# Patient Record
Sex: Female | Born: 1940 | Race: Black or African American | Hispanic: No | Smoking: Former smoker
Health system: Southern US, Community
[De-identification: ages and names within clinical notes are randomized; demographics above are authoritative.]

## PROBLEM LIST (undated history)

## (undated) DIAGNOSIS — Z5189 Encounter for other specified aftercare: Secondary | ICD-10-CM

## (undated) DIAGNOSIS — I872 Venous insufficiency (chronic) (peripheral): Secondary | ICD-10-CM

## (undated) DIAGNOSIS — M8580 Other specified disorders of bone density and structure, unspecified site: Secondary | ICD-10-CM

## (undated) DIAGNOSIS — Z6841 Body Mass Index (BMI) 40.0 and over, adult: Secondary | ICD-10-CM

## (undated) DIAGNOSIS — T300 Burn of unspecified body region, unspecified degree: Secondary | ICD-10-CM

## (undated) DIAGNOSIS — I10 Essential (primary) hypertension: Secondary | ICD-10-CM

## (undated) DIAGNOSIS — D126 Benign neoplasm of colon, unspecified: Secondary | ICD-10-CM

## (undated) DIAGNOSIS — K219 Gastro-esophageal reflux disease without esophagitis: Secondary | ICD-10-CM

## (undated) DIAGNOSIS — M1990 Unspecified osteoarthritis, unspecified site: Secondary | ICD-10-CM

## (undated) DIAGNOSIS — K649 Unspecified hemorrhoids: Secondary | ICD-10-CM

## (undated) DIAGNOSIS — H269 Unspecified cataract: Secondary | ICD-10-CM

## (undated) DIAGNOSIS — E785 Hyperlipidemia, unspecified: Secondary | ICD-10-CM

## (undated) HISTORY — DX: Unspecified osteoarthritis, unspecified site: M19.90

## (undated) HISTORY — DX: Burn of unspecified body region, unspecified degree: T30.0

## (undated) HISTORY — DX: Essential (primary) hypertension: I10

## (undated) HISTORY — DX: Encounter for other specified aftercare: Z51.89

## (undated) HISTORY — DX: Morbid (severe) obesity due to excess calories: E66.01

## (undated) HISTORY — DX: Gastro-esophageal reflux disease without esophagitis: K21.9

## (undated) HISTORY — DX: Benign neoplasm of colon, unspecified: D12.6

## (undated) HISTORY — DX: Unspecified cataract: H26.9

## (undated) HISTORY — DX: Unspecified hemorrhoids: K64.9

## (undated) HISTORY — DX: Venous insufficiency (chronic) (peripheral): I87.2

## (undated) HISTORY — DX: Body Mass Index (BMI) 40.0 and over, adult: Z684

## (undated) HISTORY — DX: Other specified disorders of bone density and structure, unspecified site: M85.80

## (undated) HISTORY — DX: Hyperlipidemia, unspecified: E78.5

---

## 1960-07-10 DIAGNOSIS — T300 Burn of unspecified body region, unspecified degree: Secondary | ICD-10-CM

## 1960-07-10 HISTORY — DX: Burn of unspecified body region, unspecified degree: T30.0

## 1960-07-10 HISTORY — PX: OTHER SURGICAL HISTORY: SHX169

## 1997-12-11 ENCOUNTER — Encounter: Admission: RE | Admit: 1997-12-11 | Discharge: 1997-12-11 | Payer: Self-pay | Admitting: Internal Medicine

## 1999-01-31 ENCOUNTER — Encounter: Admission: RE | Admit: 1999-01-31 | Discharge: 1999-01-31 | Payer: Self-pay | Admitting: Internal Medicine

## 1999-02-07 ENCOUNTER — Encounter: Admission: RE | Admit: 1999-02-07 | Discharge: 1999-02-07 | Payer: Self-pay | Admitting: Internal Medicine

## 1999-02-28 ENCOUNTER — Encounter: Admission: RE | Admit: 1999-02-28 | Discharge: 1999-02-28 | Payer: Self-pay | Admitting: Internal Medicine

## 1999-04-18 ENCOUNTER — Encounter: Admission: RE | Admit: 1999-04-18 | Discharge: 1999-04-18 | Payer: Self-pay | Admitting: Hematology and Oncology

## 1999-05-11 ENCOUNTER — Ambulatory Visit (HOSPITAL_COMMUNITY): Admission: RE | Admit: 1999-05-11 | Discharge: 1999-05-11 | Payer: Self-pay | Admitting: *Deleted

## 1999-08-01 ENCOUNTER — Encounter: Admission: RE | Admit: 1999-08-01 | Discharge: 1999-08-01 | Payer: Self-pay | Admitting: Internal Medicine

## 2000-04-17 ENCOUNTER — Encounter: Admission: RE | Admit: 2000-04-17 | Discharge: 2000-04-17 | Payer: Self-pay | Admitting: Internal Medicine

## 2000-05-14 ENCOUNTER — Ambulatory Visit (HOSPITAL_COMMUNITY): Admission: RE | Admit: 2000-05-14 | Discharge: 2000-05-14 | Payer: Self-pay

## 2000-09-18 ENCOUNTER — Encounter: Admission: RE | Admit: 2000-09-18 | Discharge: 2000-09-18 | Payer: Self-pay | Admitting: Internal Medicine

## 2000-10-18 ENCOUNTER — Encounter: Admission: RE | Admit: 2000-10-18 | Discharge: 2000-10-18 | Payer: Self-pay | Admitting: Hematology and Oncology

## 2000-11-23 ENCOUNTER — Encounter: Admission: RE | Admit: 2000-11-23 | Discharge: 2000-11-23 | Payer: Self-pay | Admitting: Internal Medicine

## 2000-11-30 ENCOUNTER — Encounter: Admission: RE | Admit: 2000-11-30 | Discharge: 2000-11-30 | Payer: Self-pay | Admitting: Internal Medicine

## 2001-04-16 ENCOUNTER — Encounter: Admission: RE | Admit: 2001-04-16 | Discharge: 2001-04-16 | Payer: Self-pay | Admitting: Internal Medicine

## 2001-09-18 ENCOUNTER — Encounter: Admission: RE | Admit: 2001-09-18 | Discharge: 2001-09-18 | Payer: Self-pay | Admitting: Internal Medicine

## 2001-09-24 ENCOUNTER — Encounter: Payer: Self-pay | Admitting: Internal Medicine

## 2001-09-24 ENCOUNTER — Ambulatory Visit (HOSPITAL_COMMUNITY): Admission: RE | Admit: 2001-09-24 | Discharge: 2001-09-24 | Payer: Self-pay | Admitting: Internal Medicine

## 2001-11-25 ENCOUNTER — Encounter: Admission: RE | Admit: 2001-11-25 | Discharge: 2001-11-25 | Payer: Self-pay | Admitting: Internal Medicine

## 2002-03-25 ENCOUNTER — Encounter: Admission: RE | Admit: 2002-03-25 | Discharge: 2002-03-25 | Payer: Self-pay | Admitting: Internal Medicine

## 2002-04-07 ENCOUNTER — Ambulatory Visit (HOSPITAL_COMMUNITY): Admission: RE | Admit: 2002-04-07 | Discharge: 2002-04-07 | Payer: Self-pay | Admitting: Internal Medicine

## 2002-05-02 ENCOUNTER — Encounter: Admission: RE | Admit: 2002-05-02 | Discharge: 2002-05-02 | Payer: Self-pay | Admitting: Internal Medicine

## 2002-11-03 ENCOUNTER — Encounter: Admission: RE | Admit: 2002-11-03 | Discharge: 2002-11-03 | Payer: Self-pay | Admitting: Internal Medicine

## 2002-12-30 ENCOUNTER — Encounter: Admission: RE | Admit: 2002-12-30 | Discharge: 2002-12-30 | Payer: Self-pay | Admitting: Internal Medicine

## 2003-03-24 ENCOUNTER — Encounter: Admission: RE | Admit: 2003-03-24 | Discharge: 2003-03-24 | Payer: Self-pay | Admitting: Internal Medicine

## 2003-04-20 ENCOUNTER — Encounter: Admission: RE | Admit: 2003-04-20 | Discharge: 2003-04-20 | Payer: Self-pay | Admitting: Internal Medicine

## 2003-06-02 ENCOUNTER — Encounter: Admission: RE | Admit: 2003-06-02 | Discharge: 2003-06-02 | Payer: Self-pay | Admitting: Internal Medicine

## 2003-06-16 ENCOUNTER — Encounter: Admission: RE | Admit: 2003-06-16 | Discharge: 2003-06-16 | Payer: Self-pay | Admitting: Internal Medicine

## 2003-06-30 ENCOUNTER — Encounter: Admission: RE | Admit: 2003-06-30 | Discharge: 2003-06-30 | Payer: Self-pay | Admitting: Internal Medicine

## 2003-11-26 ENCOUNTER — Encounter: Admission: RE | Admit: 2003-11-26 | Discharge: 2003-11-26 | Payer: Self-pay | Admitting: Internal Medicine

## 2003-11-30 ENCOUNTER — Ambulatory Visit (HOSPITAL_COMMUNITY): Admission: RE | Admit: 2003-11-30 | Discharge: 2003-11-30 | Payer: Self-pay | Admitting: Internal Medicine

## 2004-05-03 ENCOUNTER — Ambulatory Visit: Payer: Self-pay | Admitting: Internal Medicine

## 2004-05-24 ENCOUNTER — Ambulatory Visit: Payer: Self-pay | Admitting: Internal Medicine

## 2004-07-10 DIAGNOSIS — D126 Benign neoplasm of colon, unspecified: Secondary | ICD-10-CM

## 2004-07-10 HISTORY — DX: Benign neoplasm of colon, unspecified: D12.6

## 2004-10-20 ENCOUNTER — Ambulatory Visit: Payer: Self-pay | Admitting: Internal Medicine

## 2004-12-12 ENCOUNTER — Ambulatory Visit: Payer: Self-pay | Admitting: Internal Medicine

## 2004-12-14 ENCOUNTER — Ambulatory Visit (HOSPITAL_COMMUNITY): Admission: RE | Admit: 2004-12-14 | Discharge: 2004-12-14 | Payer: Self-pay | Admitting: Internal Medicine

## 2004-12-15 ENCOUNTER — Ambulatory Visit (HOSPITAL_COMMUNITY): Admission: RE | Admit: 2004-12-15 | Discharge: 2004-12-15 | Payer: Self-pay | Admitting: Internal Medicine

## 2005-02-06 ENCOUNTER — Ambulatory Visit: Payer: Self-pay | Admitting: Internal Medicine

## 2005-02-16 ENCOUNTER — Ambulatory Visit: Payer: Self-pay | Admitting: Internal Medicine

## 2005-04-03 ENCOUNTER — Ambulatory Visit: Payer: Self-pay | Admitting: Internal Medicine

## 2005-04-07 ENCOUNTER — Ambulatory Visit (HOSPITAL_COMMUNITY): Admission: RE | Admit: 2005-04-07 | Discharge: 2005-04-07 | Payer: Self-pay | Admitting: Internal Medicine

## 2005-05-02 ENCOUNTER — Ambulatory Visit (HOSPITAL_COMMUNITY): Admission: RE | Admit: 2005-05-02 | Discharge: 2005-05-02 | Payer: Self-pay | Admitting: Gastroenterology

## 2005-05-02 ENCOUNTER — Encounter (INDEPENDENT_AMBULATORY_CARE_PROVIDER_SITE_OTHER): Payer: Self-pay | Admitting: *Deleted

## 2005-05-17 ENCOUNTER — Ambulatory Visit: Payer: Self-pay | Admitting: Internal Medicine

## 2006-03-13 ENCOUNTER — Ambulatory Visit: Payer: Self-pay | Admitting: Hospitalist

## 2006-03-20 ENCOUNTER — Ambulatory Visit: Payer: Self-pay | Admitting: Internal Medicine

## 2006-03-26 ENCOUNTER — Ambulatory Visit: Payer: Self-pay | Admitting: Hospitalist

## 2006-03-29 ENCOUNTER — Ambulatory Visit: Payer: Self-pay | Admitting: Internal Medicine

## 2006-04-02 ENCOUNTER — Encounter (INDEPENDENT_AMBULATORY_CARE_PROVIDER_SITE_OTHER): Payer: Self-pay | Admitting: Infectious Diseases

## 2006-04-03 ENCOUNTER — Ambulatory Visit: Payer: Self-pay | Admitting: Hospitalist

## 2006-04-05 ENCOUNTER — Ambulatory Visit: Payer: Self-pay | Admitting: Internal Medicine

## 2006-04-11 ENCOUNTER — Ambulatory Visit (HOSPITAL_COMMUNITY): Admission: RE | Admit: 2006-04-11 | Discharge: 2006-04-11 | Payer: Self-pay | Admitting: Infectious Diseases

## 2006-04-11 ENCOUNTER — Encounter: Payer: Self-pay | Admitting: Cardiology

## 2006-04-11 ENCOUNTER — Ambulatory Visit: Payer: Self-pay | Admitting: Cardiology

## 2006-04-24 ENCOUNTER — Encounter (INDEPENDENT_AMBULATORY_CARE_PROVIDER_SITE_OTHER): Payer: Self-pay | Admitting: Infectious Diseases

## 2006-04-24 ENCOUNTER — Ambulatory Visit (HOSPITAL_COMMUNITY): Admission: RE | Admit: 2006-04-24 | Discharge: 2006-04-24 | Payer: Self-pay | Admitting: Internal Medicine

## 2006-04-26 ENCOUNTER — Ambulatory Visit: Payer: Self-pay | Admitting: Internal Medicine

## 2006-04-26 ENCOUNTER — Encounter (INDEPENDENT_AMBULATORY_CARE_PROVIDER_SITE_OTHER): Payer: Self-pay | Admitting: Infectious Diseases

## 2006-05-03 ENCOUNTER — Ambulatory Visit: Payer: Self-pay | Admitting: Internal Medicine

## 2006-05-03 ENCOUNTER — Encounter (INDEPENDENT_AMBULATORY_CARE_PROVIDER_SITE_OTHER): Payer: Self-pay | Admitting: Infectious Diseases

## 2006-05-03 DIAGNOSIS — I10 Essential (primary) hypertension: Secondary | ICD-10-CM

## 2006-05-03 DIAGNOSIS — M159 Polyosteoarthritis, unspecified: Secondary | ICD-10-CM

## 2006-05-03 DIAGNOSIS — T300 Burn of unspecified body region, unspecified degree: Secondary | ICD-10-CM

## 2006-05-03 DIAGNOSIS — E663 Overweight: Secondary | ICD-10-CM

## 2006-05-03 LAB — CONVERTED CEMR LAB
BUN: 9 mg/dL (ref 6–23)
CO2: 30 meq/L (ref 19–32)
Chloride: 101 meq/L (ref 96–112)
Creatinine, Ser: 0.69 mg/dL (ref 0.40–1.20)
Potassium: 3.6 meq/L (ref 3.5–5.3)

## 2006-05-18 ENCOUNTER — Encounter (INDEPENDENT_AMBULATORY_CARE_PROVIDER_SITE_OTHER): Payer: Self-pay | Admitting: Infectious Diseases

## 2006-07-18 DIAGNOSIS — S99919A Unspecified injury of unspecified ankle, initial encounter: Secondary | ICD-10-CM

## 2006-07-18 DIAGNOSIS — D126 Benign neoplasm of colon, unspecified: Secondary | ICD-10-CM | POA: Insufficient documentation

## 2006-07-18 DIAGNOSIS — S8990XA Unspecified injury of unspecified lower leg, initial encounter: Secondary | ICD-10-CM

## 2006-07-18 DIAGNOSIS — E785 Hyperlipidemia, unspecified: Secondary | ICD-10-CM | POA: Insufficient documentation

## 2006-07-18 DIAGNOSIS — S99929A Unspecified injury of unspecified foot, initial encounter: Secondary | ICD-10-CM

## 2006-07-18 DIAGNOSIS — M8580 Other specified disorders of bone density and structure, unspecified site: Secondary | ICD-10-CM | POA: Insufficient documentation

## 2006-09-12 ENCOUNTER — Telehealth (INDEPENDENT_AMBULATORY_CARE_PROVIDER_SITE_OTHER): Payer: Self-pay | Admitting: *Deleted

## 2006-09-14 ENCOUNTER — Telehealth (INDEPENDENT_AMBULATORY_CARE_PROVIDER_SITE_OTHER): Payer: Self-pay | Admitting: *Deleted

## 2006-09-20 ENCOUNTER — Encounter (INDEPENDENT_AMBULATORY_CARE_PROVIDER_SITE_OTHER): Payer: Self-pay | Admitting: Infectious Diseases

## 2006-09-20 ENCOUNTER — Ambulatory Visit: Payer: Self-pay | Admitting: *Deleted

## 2006-09-20 LAB — CONVERTED CEMR LAB: Glucose, Bld: 123 mg/dL — ABNORMAL HIGH (ref 70–99)

## 2007-03-20 ENCOUNTER — Ambulatory Visit: Payer: Self-pay | Admitting: Internal Medicine

## 2007-04-17 ENCOUNTER — Ambulatory Visit: Payer: Self-pay | Admitting: Infectious Diseases

## 2007-04-29 ENCOUNTER — Ambulatory Visit (HOSPITAL_COMMUNITY): Admission: RE | Admit: 2007-04-29 | Discharge: 2007-04-29 | Payer: Self-pay | Admitting: Internal Medicine

## 2007-05-22 ENCOUNTER — Encounter (INDEPENDENT_AMBULATORY_CARE_PROVIDER_SITE_OTHER): Payer: Self-pay | Admitting: Infectious Diseases

## 2007-07-29 ENCOUNTER — Telehealth (INDEPENDENT_AMBULATORY_CARE_PROVIDER_SITE_OTHER): Payer: Self-pay | Admitting: Infectious Diseases

## 2007-07-31 ENCOUNTER — Telehealth: Payer: Self-pay | Admitting: Infectious Disease

## 2007-09-04 ENCOUNTER — Ambulatory Visit: Payer: Self-pay | Admitting: Internal Medicine

## 2007-09-04 ENCOUNTER — Encounter (INDEPENDENT_AMBULATORY_CARE_PROVIDER_SITE_OTHER): Payer: Self-pay | Admitting: Infectious Diseases

## 2007-09-04 LAB — CONVERTED CEMR LAB
CO2: 27 meq/L (ref 19–32)
Calcium: 10.7 mg/dL — ABNORMAL HIGH (ref 8.4–10.5)
Creatinine, Ser: 0.59 mg/dL (ref 0.40–1.20)
Glucose, Bld: 96 mg/dL (ref 70–99)
Potassium: 3.9 meq/L (ref 3.5–5.3)

## 2008-04-07 ENCOUNTER — Ambulatory Visit: Payer: Self-pay | Admitting: Internal Medicine

## 2008-05-18 ENCOUNTER — Encounter (INDEPENDENT_AMBULATORY_CARE_PROVIDER_SITE_OTHER): Payer: Self-pay | Admitting: Internal Medicine

## 2008-05-18 ENCOUNTER — Ambulatory Visit: Payer: Self-pay | Admitting: Internal Medicine

## 2008-05-18 LAB — CONVERTED CEMR LAB
Hgb A1c MFr Bld: 6.1 %
LDL Cholesterol: 83 mg/dL

## 2008-05-19 ENCOUNTER — Telehealth: Payer: Self-pay | Admitting: *Deleted

## 2008-05-20 LAB — CONVERTED CEMR LAB
Alkaline Phosphatase: 81 units/L (ref 39–117)
Creatinine, Ser: 0.84 mg/dL (ref 0.40–1.20)
Creatinine, Urine: 16.1 mg/dL
Glucose, Bld: 104 mg/dL — ABNORMAL HIGH (ref 70–99)
Hemoglobin: 14.2 g/dL (ref 12.0–15.0)
MCHC: 33 g/dL (ref 30.0–36.0)
MCV: 89.6 fL (ref 78.0–100.0)
Platelets: 310 10*3/uL (ref 150–400)
RBC: 4.8 M/uL (ref 3.87–5.11)
RDW: 13.1 % (ref 11.5–15.5)
TSH: 2.381 microintl units/mL (ref 0.350–4.50)
Total Bilirubin: 0.5 mg/dL (ref 0.3–1.2)

## 2008-06-03 ENCOUNTER — Ambulatory Visit (HOSPITAL_COMMUNITY): Admission: RE | Admit: 2008-06-03 | Discharge: 2008-06-03 | Payer: Self-pay | Admitting: Internal Medicine

## 2008-06-16 ENCOUNTER — Encounter: Payer: Self-pay | Admitting: Internal Medicine

## 2008-08-04 ENCOUNTER — Encounter: Payer: Self-pay | Admitting: Internal Medicine

## 2009-01-25 ENCOUNTER — Encounter: Payer: Self-pay | Admitting: Internal Medicine

## 2009-01-25 ENCOUNTER — Ambulatory Visit: Payer: Self-pay | Admitting: Internal Medicine

## 2009-01-28 LAB — CONVERTED CEMR LAB
BUN: 11 mg/dL (ref 6–23)
CO2: 25 meq/L (ref 19–32)
Calcium: 10.6 mg/dL — ABNORMAL HIGH (ref 8.4–10.5)
Chloride: 100 meq/L (ref 96–112)
Creatinine, Ser: 0.76 mg/dL (ref 0.40–1.20)
Glucose, Bld: 97 mg/dL (ref 70–99)
Potassium: 3.8 meq/L (ref 3.5–5.3)
Sodium: 142 meq/L (ref 135–145)

## 2009-02-15 ENCOUNTER — Ambulatory Visit: Payer: Self-pay | Admitting: Internal Medicine

## 2009-02-17 ENCOUNTER — Ambulatory Visit (HOSPITAL_COMMUNITY): Admission: RE | Admit: 2009-02-17 | Discharge: 2009-02-17 | Payer: Self-pay | Admitting: Internal Medicine

## 2009-04-15 ENCOUNTER — Ambulatory Visit: Payer: Self-pay | Admitting: Internal Medicine

## 2009-05-04 ENCOUNTER — Ambulatory Visit (HOSPITAL_COMMUNITY): Admission: RE | Admit: 2009-05-04 | Discharge: 2009-05-04 | Payer: Self-pay | Admitting: Internal Medicine

## 2009-05-11 ENCOUNTER — Telehealth: Payer: Self-pay | Admitting: Internal Medicine

## 2009-08-31 ENCOUNTER — Ambulatory Visit: Payer: Self-pay | Admitting: Internal Medicine

## 2009-08-31 LAB — CONVERTED CEMR LAB
AST: 15 units/L (ref 0–37)
BUN: 9 mg/dL (ref 6–23)
CO2: 30 meq/L (ref 19–32)
Calcium: 10.7 mg/dL — ABNORMAL HIGH (ref 8.4–10.5)
Creatinine, Ser: 0.72 mg/dL (ref 0.40–1.20)
HCT: 40.6 % (ref 36.0–46.0)
HDL: 87 mg/dL (ref >39)
Hemoglobin: 13.5 g/dL (ref 12.0–15.0)
LDL Cholesterol: 91 mg/dL (ref 0–99)
RBC: 4.51 M/uL (ref 3.87–5.11)
Sodium: 139 meq/L (ref 135–145)
Total Bilirubin: 0.4 mg/dL (ref 0.3–1.2)
Total Protein: 8.1 g/dL (ref 6.0–8.3)
WBC: 5 10*3/uL (ref 4.0–10.5)

## 2009-09-07 ENCOUNTER — Telehealth: Payer: Self-pay | Admitting: Internal Medicine

## 2009-10-04 ENCOUNTER — Ambulatory Visit: Payer: Self-pay | Admitting: Infectious Diseases

## 2009-10-25 ENCOUNTER — Ambulatory Visit: Payer: Self-pay | Admitting: Internal Medicine

## 2009-12-23 ENCOUNTER — Telehealth: Payer: Self-pay | Admitting: Licensed Clinical Social Worker

## 2009-12-27 ENCOUNTER — Telehealth: Payer: Self-pay | Admitting: Internal Medicine

## 2010-02-07 ENCOUNTER — Encounter: Payer: Self-pay | Admitting: Internal Medicine

## 2010-02-09 ENCOUNTER — Telehealth: Payer: Self-pay | Admitting: Internal Medicine

## 2010-02-18 ENCOUNTER — Encounter: Payer: Self-pay | Admitting: Internal Medicine

## 2010-02-18 ENCOUNTER — Ambulatory Visit: Payer: Self-pay | Admitting: Internal Medicine

## 2010-04-04 ENCOUNTER — Ambulatory Visit: Payer: Self-pay | Admitting: Internal Medicine

## 2010-04-06 ENCOUNTER — Encounter: Payer: Self-pay | Admitting: Internal Medicine

## 2010-05-06 ENCOUNTER — Ambulatory Visit: Payer: Self-pay | Admitting: Internal Medicine

## 2010-05-06 LAB — CONVERTED CEMR LAB
AST: 18 units/L (ref 0–37)
Albumin: 4.2 g/dL (ref 3.5–5.2)
Alkaline Phosphatase: 72 units/L (ref 39–117)
CO2: 31 meq/L (ref 19–32)
Calcium: 10.1 mg/dL (ref 8.4–10.5)
Chloride: 99 meq/L (ref 96–112)
Creatinine, Ser: 0.64 mg/dL (ref 0.40–1.20)
Glucose, Bld: 121 mg/dL — ABNORMAL HIGH (ref 70–99)
Potassium: 3.7 meq/L (ref 3.5–5.3)
Total Protein: 8 g/dL (ref 6.0–8.3)

## 2010-05-18 ENCOUNTER — Ambulatory Visit (HOSPITAL_COMMUNITY): Admission: RE | Admit: 2010-05-18 | Discharge: 2010-05-18 | Payer: Self-pay | Admitting: Internal Medicine

## 2010-05-18 LAB — HM MAMMOGRAPHY: HM Mammogram: NEGATIVE

## 2010-05-19 ENCOUNTER — Encounter: Payer: Self-pay | Admitting: Internal Medicine

## 2010-06-06 ENCOUNTER — Ambulatory Visit: Payer: Self-pay | Admitting: Internal Medicine

## 2010-07-13 ENCOUNTER — Encounter: Payer: Self-pay | Admitting: Internal Medicine

## 2010-08-11 NOTE — Consult Note (Signed)
Summary: [facility]  [facility]   Imported By: Louretta Parma 04/13/2010 15:39:44  _____________________________________________________________________  External Attachment:    Type:   Image     Comment:   External Document
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
## 2010-08-11 NOTE — Miscellaneous (Signed)
Summary: [place] ASSESSMENT FOR IN [facility]ME CARE  Amanda Roman ASSESSMENT FOR IN H OME CARE   Imported By: Shon Hough 07/22/2010 13:55:24  _____________________________________________________________________  External Attachment:    Type:   Image     Comment:   External Document

## 2010-08-11 NOTE — Progress Notes (Signed)
Summary: med refill/gp  Phone Note Refill Request Message from:  Pharmacy on February 09, 2010 10:42 AM  Refills Requested: Medication #1:  COREG 12.5 MG TABS Take 1 tablet by mouth twice daily Last appt. March 3.   Method Requested: Electronic Initial call taken by: Chinita Pester RN,  February 09, 2010 10:41 AM    Prescriptions: COREG 12.5 MG TABS (CARVEDILOL) Take 1 tablet by mouth twice daily  #60 x 3   Entered and Authorized by:   Laren Everts MD   Signed by:   Laren Everts MD on 02/09/2010   Method used:   Electronically to        [facility] [address] (retail)       [address]       [place], [place]  [id number]       Ph: [phone]       Fax: [phone]   RxID:   [id number]

## 2010-08-11 NOTE — Consult Note (Signed)
Summary: [facility]   Imported By: Louretta Parma 05/27/2010 16:50:49  _____________________________________________________________________  External Attachment:    Type:   Image     Comment:   External Document  Appended Document: [facility]    Clinical Lists Changes  Observations: Added new observation of COLONOSCOPY:  Nl except hemorrhoids F/U 5 yrs (05/19/2010 15:22)

## 2010-08-11 NOTE — Letter (Signed)
Summary: BLOOD PRESSURE  BLOOD PRESSURE   Imported By: Margie Billet 03/10/2010 14:50:51  _____________________________________________________________________  External Attachment:    Type:   Image     Comment:   External Document

## 2010-08-11 NOTE — Medication Information (Signed)
Summary: PATIENT HISTORY REPORT  PATIENT HISTORY REPORT   Imported By: Margie Billet 05/12/2010 09:05:37  _____________________________________________________________________  External Attachment:    Type:   Image     Comment:   External Document

## 2010-08-11 NOTE — Assessment & Plan Note (Signed)
Summary: flu shot/cfb  Nurse Visit   Allergies: 1)  ! Codeine 2)  ! Pcn 3)  ! Tylenol/codeine #3 (Acetaminophen-Codeine)  Immunizations Administered:  Influenza Vaccine # 1:    Vaccine Type: Fluvax MCR    Site: left deltoid    Mfr: [employer]    Dose: 0.5 ml    Route: IM    Given by: Stanton Kidney Ditzler RN    Exp. Date: 01/07/2011    Lot #: [id number]    VIS given: 02/01/10 version given April 04, 2010.  Flu Vaccine Consent Questions:    Do you have a history of severe allergic reactions to this vaccine? no    Any prior history of allergic reactions to egg and/or gelatin? no    Do you have a sensitivity to the preservative Thimersol? no    Do you have a past history of Guillan-Barre Syndrome? no    Do you currently have an acute febrile illness? no    Have you ever had a severe reaction to latex? no    Vaccine information given and explained to patient? yes    Are you currently pregnant? no  Orders Added: 1)  Influenza Vaccine MCR [00025]

## 2010-08-11 NOTE — Progress Notes (Signed)
Summary: Refill/gh  Phone Note Refill Request Message from:  Fax from Pharmacy on December 27, 2009 9:24 AM  Refills Requested: Medication #1:  CLONIDINE HCL 0.1 MG TABS Take 1 pill by mouth two times a day   Last Refilled: 11/25/2009  Method Requested: Electronic Initial call taken by: Angelina Ok RN,  December 27, 2009 9:24 AM    Prescriptions: CLONIDINE HCL 0.1 MG TABS (CLONIDINE HCL) Take 1 pill by mouth two times a day  #60 x 2   Entered and Authorized by:   Laren Everts MD   Signed by:   Laren Everts MD on 12/27/2009   Method used:   Electronically to        [facility] (retail)       [address]       [place], [place]  [id number]       Ph: [phone]       Fax: [phone]   RxID:   [id number]
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
## 2010-08-11 NOTE — Assessment & Plan Note (Signed)
Summary: F/U/VEGA/VS   Vital Signs:  Patient profile:   70 year old female Height:      59.5 inches (151.13 cm) Weight:      197.4 pounds (89.73 kg) BMI:     39.34 Temp:     97.3 degrees F oral Pulse rate:   98 / minute BP sitting:   186 / 98  (right arm)  Vitals Entered By: Chinita Pester RN (October 04, 2009 9:32 AM) CC: F/U visit.  Right knee pain. Is Patient Diabetic? No Pain Assessment Patient in pain? no      Nutritional Status BMI of > 30 = obese  Have you ever been in a relationship where you felt threatened, hurt or afraid?No   Does patient need assistance? Functional Status Self care Ambulation Normal   CC:  F/U visit.  Right knee pain.Marland Kitchen  History of Present Illness: [name] is a 70 yo F with PMH below who presents for 1 month BP check. She is currently taking Norvasc 10, HCTZ 25, Coreg 6.25 BID, Clonidine 0.2, and Benazepril 40 and her SBP today = 207. A BP recheck was 186/98. She has her BP checked every Tuesday and Thursday, and last Tuesday it was 130/80 (and was 142/90 a couple weeks previously). Saturday, she checked it at the pharmacy and it was 159/83. These are the only 3 readings I have. She feels well other than knee arthritis that "acted up" over the weekend, for which she took Aleve twice a day.  Depression History:      The patient denies a depressed mood most of the day and a diminished interest in her usual daily activities.         Preventive Screening-Counseling & Management  Alcohol-Tobacco     Alcohol drinks/day: 0     Smoking Status: quit > 6 months     Year Quit: 1970  Caffeine-Diet-Exercise     Does Patient Exercise: yes     Type of exercise: WALKING     Times/week: 2  Current Medications (verified): 1)  Coreg 12.5 Mg Tabs (Carvedilol) .... Take 1 Tablet By Mouth Twice Daily 2)  Norvasc 10 Mg Tabs (Amlodipine Besylate) .... Take 1 Tablet By Mouth Once A Day 3)  Clonidine Hcl 0.2 Mg Tabs (Clonidine Hcl) .... Take 1 Tablet By  Mouth Two Times A Day 4)  Multivitamins  Tabs (Multiple Vitamin) .... Take 1 Tablet By Mouth Once A Day 5)  Benazepril Hcl 40 Mg Tabs (Benazepril Hcl) .... Take 1 Tablet By Mouth Once A Day 6)  Hydrochlorothiazide 50 Mg Tabs (Hydrochlorothiazide) .... Take 1/2  Tablet By Mouth Once A Day 7)  Oscal 500/200 D-3 500-200 Mg-Unit Tabs (Calcium-Vitamin D) .... Take 1 Tablet By Mouth Three Times A Day  Allergies (verified): 1)  ! Codeine 2)  ! Pcn 3)  ! Tylenol/codeine #3 (Acetaminophen-Codeine)  Past History:  Past Medical History: Last updated: 05/03/2006 Hypertension Osteoarthritis  Social History: Last updated: 09/20/2006 Single Former Smoker Alcohol use-no  Risk Factors: Alcohol Use: 0 (10/04/2009) Exercise: yes (10/04/2009)  Risk Factors: Smoking Status: quit > 6 months (10/04/2009)  Review of Systems      See HPI  Physical Exam  General:  Well-developed,well-nourished,in no acute distress; alert,appropriate and cooperative throughout examination Head:  Normocephalic and atraumatic without obvious abnormalities. No apparent alopecia or balding. Neurologic:  alert & oriented X3.   Psych:  Cognition and judgment appear intact. Alert and cooperative with normal attention span and concentration. No apparent delusions,  illusions, hallucinations   Impression & Recommendations:  Problem # 1:  HYPERTENSION (ICD-401.9) I think her elevated BP is likely due to a few different things, including taking NSAIDs regularly, white coat hypertension, and taking her BP meds later this morning (so maybe they haven't had a chance to work yet). I told her to try taking Tylenol instead of Aleve/Advil/ibuprofen for her knee pain. Note that she has an allergy listed to Tylenol #3, but this is likely due to the Codeine component. I told her to stop taking the Tylenol and call us if she gets a rash or other allergy-type symptom after taking it. I also suggested she take her BP and pulse as  frequently as possible and to write down all the numbers in her notebook for [place] to review. That should give [place] a better idea of what her BP looks like on a regular basis. I spoke with Dr. Sampson Goon about this and he also suggested that we increase her Coreg to 12.5 mg two times a day, so she will do that as well. She will return in 2-3 weeks for a BP check.  Her updated medication list for this problem includes:    Coreg 12.5 Mg Tabs (Carvedilol) .Marland Kitchen... Take 1 tablet by mouth twice daily    Norvasc 10 Mg Tabs (Amlodipine besylate) .Marland Kitchen... Take 1 tablet by mouth once a day    Clonidine Hcl 0.2 Mg Tabs (Clonidine hcl) .Marland Kitchen... Take 1 tablet by mouth two times a day    Benazepril Hcl 40 Mg Tabs (Benazepril hcl) .Marland Kitchen... Take 1 tablet by mouth once a day    Hydrochlorothiazide 50 Mg Tabs (Hydrochlorothiazide) .Marland Kitchen... Take 1/2  tablet by mouth once a day  Problem # 2:  OSTEOARTHRITIS (ICD-715.90) See #1. She will try Tylenol instead of NSAIDs for her pain and see if that helps.   The following medications were removed from the medication list:    Naprosyn 250 Mg Tabs (Naproxen) .Marland Kitchen... Take 1 tablet by mouth twice a day as needed for pain  Complete Medication List: 1)  Coreg 12.5 Mg Tabs (Carvedilol) .... Take 1 tablet by mouth twice daily 2)  Norvasc 10 Mg Tabs (Amlodipine besylate) .... Take 1 tablet by mouth once a day 3)  Clonidine Hcl 0.2 Mg Tabs (Clonidine hcl) .... Take 1 tablet by mouth two times a day 4)  Multivitamins Tabs (Multiple vitamin) .... Take 1 tablet by mouth once a day 5)  Benazepril Hcl 40 Mg Tabs (Benazepril hcl) .... Take 1 tablet by mouth once a day 6)  Hydrochlorothiazide 50 Mg Tabs (Hydrochlorothiazide) .... Take 1/2  tablet by mouth once a day 7)  Oscal 500/200 D-3 500-200 Mg-unit Tabs (Calcium-vitamin d) .... Take 1 tablet by mouth three times a day  Patient Instructions: 1)  Please schedule a follow-up appointment in 2-3 weeks. 2)  Please do not take Aleve, ibuprofen, or Advil  for your arthritis as these medicines can raise your blood pressure. Instead, take Tylenol 650 mg every six hours as needed and see how that works. 3)  Increase your Coreg (carvedilol) to 12.5 mg twice daily. 4)  Continue to take your blood pressure as frequently as possible and try to write down all the numbers and your pulse in your notebook. Bring that with you to your next visit. Prescriptions: COREG 12.5 MG TABS (CARVEDILOL) Take 1 tablet by mouth twice daily  #60 x 3   Entered and Authorized by:   Silvestre Gunner MD   Signed  by:   Silvestre Gunner MD on 10/04/2009   Method used:   Electronically to        [facility] ([facility])       [address]       [place], [place]  [id number]       Ph: [phone]       Fax: [phone]   RxID:   [id number]   Prevention & Chronic Care Immunizations   Influenza vaccine: Fluvax MCR  (04/15/2009)    Tetanus booster: Not documented    Pneumococcal vaccine: Historical  (11/18/2000)    H. zoster vaccine: Not documented  Colorectal Screening   Hemoccult: Negative  (05/29/2006)    Colonoscopy: Normal  (04/07/2005)  Other Screening   Pap smear: Not documented    Mammogram: ASSESSMENT: Negative - BI-RADS 1^MM DIGITAL SCREENING  (05/04/2009)    DXA bone density scan: Not documented   DXA bone density action/deferral: Ordered  (02/15/2009)   Smoking status: quit > 6 months  (10/04/2009)  Lipids   Total Cholesterol: 194  (08/31/2009)   Lipid panel action/deferral: Lipid Panel ordered   LDL: 91  (08/31/2009)   LDL Direct: Not documented   HDL: 87  (08/31/2009)   Triglycerides: 78  (08/31/2009)    SGOT (AST): 15  (08/31/2009)   BMP action: Ordered   SGPT (ALT): 14  (08/31/2009)   Alkaline phosphatase: 63  (08/31/2009)   Total bilirubin: 0.4  (08/31/2009)  Hypertension   Last Blood Pressure: 186 / 98  (10/04/2009)   Serum creatinine: 0.72  (08/31/2009)   BMP action: Ordered   Serum potassium 3.5   (08/31/2009)  Self-Management Support :   Personal Goals (by the next clinic visit) :      Personal blood pressure goal: 140/90  (08/31/2009)     Personal LDL goal: 130  (08/31/2009)    Patient will work on the following items until the next clinic visit to reach self-care goals:     Medications and monitoring: check my blood pressure  (10/04/2009)     Eating: use fresh or frozen vegetables, eat foods that are low in salt, eat baked foods instead of fried foods  (10/04/2009)     Activity: take a 30 minute walk every day  (10/04/2009)    Hypertension self-management support: Written self-care plan  (10/04/2009)   Hypertension self-care plan printed.    Lipid self-management support: Written self-care plan  (10/04/2009)   Lipid self-care plan printed.

## 2010-08-11 NOTE — Progress Notes (Signed)
Summary: Refill/gh  Phone Note Refill Request Message from:  Fax from Pharmacy on September 07, 2009 11:30 AM  Refills Requested: Medication #1:  HYDROCHLOROTHIAZIDE 50 MG TABS Take 1/2  tablet by mouth once a day   Last Refilled: 08/09/2009  Method Requested: Electronic Initial call taken by: Angelina Ok RN,  September 07, 2009 11:30 AM    Prescriptions: HYDROCHLOROTHIAZIDE 50 MG TABS (HYDROCHLOROTHIAZIDE) Take 1/2  tablet by mouth once a day  #30 x 3   Entered and Authorized by:   Laren Everts MD   Signed by:   Laren Everts MD on 09/08/2009   Method used:   Electronically to        [facility] ([facility]ail)       [address]       [place], [place]  [id number]       Ph: [phone]       Fax: [phone]   RxID:   [id number]

## 2010-08-11 NOTE — Progress Notes (Signed)
Summary: Soc. Work  Nurse, children's placed by: Soc. Work Call placed to: Patient Summary of Call: Patient left message asking [place] to order assessment for personal care services.  She tells me she needs help with cleaning but can dress and bathe herself.   I told her to make an appointment with her physician to further discuss but based on what she is telling me she would not qualify based on primarily needing someone to clean her home.        Noted. Agree with above.  Thanks,  Dr. Cena Benton

## 2010-08-23 ENCOUNTER — Other Ambulatory Visit: Payer: Self-pay | Admitting: Internal Medicine

## 2010-08-23 ENCOUNTER — Ambulatory Visit (HOSPITAL_COMMUNITY)
Admission: RE | Admit: 2010-08-23 | Discharge: 2010-08-23 | Disposition: A | Discharge status: Home / Self Care | Payer: PRIVATE HEALTH INSURANCE | Source: Ambulatory Visit | Attending: Internal Medicine | Admitting: Internal Medicine

## 2010-08-23 ENCOUNTER — Ambulatory Visit (INDEPENDENT_AMBULATORY_CARE_PROVIDER_SITE_OTHER): Payer: PRIVATE HEALTH INSURANCE | Admitting: Internal Medicine

## 2010-08-23 ENCOUNTER — Encounter: Payer: Self-pay | Admitting: Internal Medicine

## 2010-08-23 DIAGNOSIS — M1710 Unilateral primary osteoarthritis, unspecified knee: Secondary | ICD-10-CM | POA: Insufficient documentation

## 2010-08-23 DIAGNOSIS — M25569 Pain in unspecified knee: Secondary | ICD-10-CM

## 2010-08-23 DIAGNOSIS — M1990 Unspecified osteoarthritis, unspecified site: Secondary | ICD-10-CM

## 2010-08-23 DIAGNOSIS — E669 Obesity, unspecified: Secondary | ICD-10-CM

## 2010-08-23 DIAGNOSIS — M25551 Pain in right hip: Secondary | ICD-10-CM

## 2010-08-23 DIAGNOSIS — M25559 Pain in unspecified hip: Secondary | ICD-10-CM | POA: Insufficient documentation

## 2010-08-23 DIAGNOSIS — M169 Osteoarthritis of hip, unspecified: Secondary | ICD-10-CM | POA: Insufficient documentation

## 2010-08-23 DIAGNOSIS — I10 Essential (primary) hypertension: Secondary | ICD-10-CM

## 2010-08-23 DIAGNOSIS — IMO0002 Reserved for concepts with insufficient information to code with codable children: Secondary | ICD-10-CM | POA: Insufficient documentation

## 2010-08-23 DIAGNOSIS — M1610 Unilateral primary osteoarthritis, unspecified hip: Secondary | ICD-10-CM | POA: Insufficient documentation

## 2010-08-23 NOTE — Assessment & Plan Note (Signed)
Knee and hip pain. Check plain films. Rest, Ice, f/u in 48 hrs to review films, and for possible right trochanter injection at hip.

## 2010-08-23 NOTE — Progress Notes (Signed)
  Subjective:    Patient ID: Amanda Roman, female    DOB: August 31, 1940, 69 y.o.   MRN: [id number]  HPI Patient is a pleasant woman with a known history of osteoarthritis who presents with pain in both the right hip and right knee. Some difficulty walking, subacute onset. Hip pain radiates to lateral thigh.  Knee pain most pronounced on the lateral superior patellar edge.   She can recall no injuries or falls, No effusions, clicking or locking of knee.  Repeat BP 130/70   Review of Systems     Objective:   Physical Exam  Constitutional: She is oriented to person, place, and time. She appears well-developed and well-nourished.  Neurological: She is alert and oriented to person, place, and time. She displays normal reflexes. She exhibits normal muscle tone.  Skin: Skin is warm and dry.      Right hip-excellent ROM-passive I/E rotation, flexion.  Tender over lateral GTB    Ischial bursa non-tender     Assessment & Plan:  Right Hip Pain-  Suspect GTBursitis on right. As a precaution check plain films of hip and knee, RTC in 48 hrs to review  Right knee pain Plain fils 4 view (sunrise to evaluate patella)  Use of OTC acetominophin ok  HTN  Good control. No change in meds

## 2010-08-24 ENCOUNTER — Telehealth: Payer: Self-pay | Admitting: *Deleted

## 2010-08-24 NOTE — Telephone Encounter (Signed)
Pt called to check results from 2/14, she is informed that when md reviews he will contact her if there are concerns, she is agreeable

## 2010-08-30 NOTE — Progress Notes (Signed)
Pt. Has an appt. Already schedule 09/09/10 w/Dr. Cena Benton.

## 2010-09-09 ENCOUNTER — Ambulatory Visit (INDEPENDENT_AMBULATORY_CARE_PROVIDER_SITE_OTHER): Payer: PRIVATE HEALTH INSURANCE | Admitting: Internal Medicine

## 2010-09-09 ENCOUNTER — Encounter: Payer: Self-pay | Admitting: Internal Medicine

## 2010-09-09 DIAGNOSIS — I10 Essential (primary) hypertension: Secondary | ICD-10-CM

## 2010-09-09 DIAGNOSIS — Z79899 Other long term (current) drug therapy: Secondary | ICD-10-CM

## 2010-09-09 DIAGNOSIS — M25569 Pain in unspecified knee: Secondary | ICD-10-CM

## 2010-09-09 DIAGNOSIS — E119 Type 2 diabetes mellitus without complications: Secondary | ICD-10-CM

## 2010-09-09 DIAGNOSIS — M25551 Pain in right hip: Secondary | ICD-10-CM

## 2010-09-09 DIAGNOSIS — M25559 Pain in unspecified hip: Secondary | ICD-10-CM

## 2010-09-09 DIAGNOSIS — E785 Hyperlipidemia, unspecified: Secondary | ICD-10-CM

## 2010-09-09 LAB — GLUCOSE, CAPILLARY: Glucose-Capillary: 83 mg/dL (ref 70–99)

## 2010-09-09 LAB — POCT GLYCOSYLATED HEMOGLOBIN (HGB A1C): Hemoglobin A1C: 5.7

## 2010-09-09 MED ORDER — NAPROXEN 500 MG PO TABS: 500.0000 mg | ORAL_TABLET | Freq: Two times a day (BID) | ORAL | Status: DC

## 2010-09-09 NOTE — Assessment & Plan Note (Signed)
Uncontrolled. Xrays showed mild degenerative disease. Patient will be referred to sports medicine for steroid injection. Will have patient start taking naproxen for pain as needed.
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
## 2010-09-09 NOTE — Assessment & Plan Note (Signed)
LDL 91 2/11. Diet controlled. Recheck FLP on follow up.

## 2010-09-09 NOTE — Patient Instructions (Signed)
Make follow up appointment in 1-2 months. Take all medication as directed.

## 2010-09-09 NOTE — Assessment & Plan Note (Signed)
Most likely 2/2 bursitis. Referred to sports medicine for steroid injection. Xray of right hip showed moderate degenerative changes involving the right hip.

## 2010-09-09 NOTE — Assessment & Plan Note (Signed)
Uncontrolled in the visit most likely as patient ran out of medication and some component of whitecoat hypertension. Will continue current regimen. Recheck on follow up.

## 2010-09-13 ENCOUNTER — Encounter: Payer: Self-pay | Admitting: Family Medicine

## 2010-09-13 ENCOUNTER — Ambulatory Visit (INDEPENDENT_AMBULATORY_CARE_PROVIDER_SITE_OTHER): Payer: Medicare Other | Admitting: Family Medicine

## 2010-09-13 DIAGNOSIS — M1710 Unilateral primary osteoarthritis, unspecified knee: Secondary | ICD-10-CM

## 2010-09-16 NOTE — Telephone Encounter (Signed)
Xrays show moderate arthritis in both knee and hip   Ensure she has FU appt scheduled

## 2010-09-20 NOTE — Assessment & Plan Note (Signed)
Summary: NP,RT KNEE PAIN,MC [phone]   Vital Signs:  Patient profile:   70 year old female BP sitting:   140 / 72  (right arm)  Vitals Entered By: Rochele Pages RN (September 13, 2010 3:02 PM)  History of Present Illness:  OA flare, R 3 mo  Very pleasant AAF presents with 3 mo h/o knee pain. Patient presents with 3 mo  h/o R knee pain with insidious onset. The patient has not had an effusion. No symptomatic giving-way. No mechanical clicking. Joint has not locked up. Patient has been able to walk but is limping - using a cane The patient does have pain going up and down stairs or rising from a seated position.   Pain location: all over Current physical activity: none Prior Knee Surgery: none Current pain meds: Tylenol, another med received yesterday - she believes Anaprox Bracing: none  R knee series, independently reviewed. Moderate tricompartmental OA, greatest medially, with a small degree of osteophyte formation  Allergies: 1)  ! Codeine 2)  ! Pcn 3)  ! Tylenol/codeine #3 (Acetaminophen-Codeine)  Past History:  Past medical, surgical, family and social histories (including risk factors) reviewed, and no changes noted (except as noted below).  Past Medical History: Reviewed history from 05/03/2006 and no changes required. Hypertension Osteoarthritis  Family History: Reviewed history from 02/18/2010 and no changes required. mother: DM  Social History: Reviewed history from 02/18/2010 and no changes required. 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 Otherwise the pertinent positives of the ROS are noted above.    Physical  Exam  General:  GEN: Well-developed,well-nourished,in no acute distress; alert,appropriate and cooperative throughout examination HEENT: Normocephalic and atraumatic without obvious abnormalities. No apparent alopecia or balding. Ears, externally no deformities PULM: Breathing comfortably in no respiratory distress EXT: No clubbing, cyanosis, or edema PSYCH: Normally interactive. Cooperative during the interview. Pleasant. Friendly and conversant. Not anxious or depressed appearing. Normal, full affect.  Msk:  ROM: WNL, flexion to 120, full ext Effusion: neg Echymosis or edema: none Patellar tendon NT Painful PLICA: neg Patellar grind: POS Medial and lateral patellar facet loading: TENDER MEDIALLY medial and lateral joint lines:TTP GREATEST MEDIALLY Mcmurray's SOME PAIN Flexion-pinch POS Varus and valgus stress: stable Lachman: neg Ant and Post drawer: neg Hip abduction, IR, ER: WNL Hip flexion str: 5/5 Hip abd: 5/5 Quad: 5/5 VMO atrophy:MILD Hamstring concentric and eccentric: 5/5    Impression & Recommendations:  Problem # 1:  OSTEOARTHRITIS, KNEE, RIGHT (ICD-715.96) Assessment New  R knee OA flare  Tylenol Ice Anaprox is OK for 2-3 weeks, but would discourage long term given significant HTN Intraarticular injection for flare  Knee Injection Patient verbally consented to procedure. Risks, benefits, and alternatives explained. Sterilely prepped with betadine. Ethyl cholride used for anesthesia. 9 cc Lidocaine 1% mixed with 1 cc of Kenalog 40 mg injected using the anterolateral approach without difficulty. No complications with procedure and tolerated well. Patient had decreased pain post-injection.   Her updated medication list for this problem includes:    Tylenol Extra Strength 500 Mg Tabs (Acetaminophen) .Marland Kitchen... Take 1 tablet by mouth three times a day as needed for pain  Orders: Joint Aspirate /  Injection, Large (20610) Kenalog 10mg  (4units) (J3301)  Complete  Medication List: 1)  Coreg 12.5 Mg Tabs (Carvedilol) .... Take 1 tablet by mouth twice daily 2)  Norvasc 10 Mg Tabs (Amlodipine besylate) .... Take 1 tablet by mouth once a day 3)  Catapres 0.1 Mg Tabs (Clonidine hcl) .... Take 1 tablet by mouth two times a day 4)  Multivitamins Tabs (Multiple vitamin) .... Take 1 tablet by mouth once a day 5)  Benazepril Hcl 40 Mg Tabs (Benazepril hcl) .... Take 1 tablet by mouth once a day 6)  Hydrochlorothiazide 50 Mg Tabs (Hydrochlorothiazide) .... Take 1/2  tablet by mouth once a day 7)  Oscal 500/200 D-3 500-200 Mg-unit Tabs (Calcium-vitamin d) .... Take 1 tablet by mouth three times a day 8)  Tylenol Extra Strength 500 Mg Tabs (Acetaminophen) .... Take 1 tablet by mouth three times a day as needed for pain   Orders Added: 1)  New Patient Level III [45409] 2)  Joint Aspirate / Injection, Large [20610] 3)  Kenalog 10mg  (4units) [J3301]

## 2010-10-11 NOTE — Telephone Encounter (Signed)
Not sure if you got this?  I called and left a phone message for patient to be sure to follow up with us-would call back and confirm, since not sure she knows how to navigate the system

## 2010-10-17 ENCOUNTER — Encounter: Payer: Self-pay | Admitting: *Deleted

## 2010-10-17 NOTE — Telephone Encounter (Signed)
Pt has an appt at sports med, will ask that appt be made for f/u after sports med appt, sending this note to [facility] for appt

## 2010-10-17 NOTE — Progress Notes (Unsigned)
Please schedule a f/u appt for pt per dr Reche Dixon for after visit to sports med  Thank you, h.

## 2010-10-31 ENCOUNTER — Encounter: Payer: Self-pay | Admitting: Internal Medicine

## 2010-10-31 ENCOUNTER — Ambulatory Visit (INDEPENDENT_AMBULATORY_CARE_PROVIDER_SITE_OTHER): Payer: PRIVATE HEALTH INSURANCE | Admitting: Internal Medicine

## 2010-10-31 VITALS — BP 196/101 | HR 97 | Temp 97.1°F | Ht <= 58 in | Wt 200.1 lb

## 2010-10-31 DIAGNOSIS — M1710 Unilateral primary osteoarthritis, unspecified knee: Secondary | ICD-10-CM

## 2010-10-31 DIAGNOSIS — I10 Essential (primary) hypertension: Secondary | ICD-10-CM

## 2010-10-31 DIAGNOSIS — IMO0002 Reserved for concepts with insufficient information to code with codable children: Secondary | ICD-10-CM

## 2010-10-31 LAB — BASIC METABOLIC PANEL
Calcium: 10.6 mg/dL — ABNORMAL HIGH (ref 8.4–10.5)
Creat: 0.66 mg/dL (ref 0.40–1.20)
Glucose, Bld: 131 mg/dL — ABNORMAL HIGH (ref 70–99)

## 2010-10-31 MED ORDER — CLONIDINE HCL 0.2 MG PO TABS: 0.2000 mg | ORAL_TABLET | Freq: Two times a day (BID) | ORAL | Status: DC

## 2010-10-31 MED ORDER — CARVEDILOL 25 MG PO TABS: 25.0000 mg | ORAL_TABLET | Freq: Two times a day (BID) | ORAL | Status: DC

## 2010-10-31 NOTE — Patient Instructions (Signed)
Please increase your Carvedilol to 25mg  twice daily.  Please increase your Clonidine to 0.2mg  twice daily.

## 2010-10-31 NOTE — Progress Notes (Signed)
  Subjective:    Patient ID: Amanda Roman, female    DOB: 03-Aug-1940, 70 y.o.   MRN: [id number]  HPI  70 year old female with a past medical history listed below, presents to outpatient clinic for followup of her blood pressure and right knee pain. She was seen in the clinic back in February for these problems, an x-ray of her right knee shows mild degenerative changes with no bony abnormalities, she was seen by sports medicine and she was given a steroid injection to her knee which has relieved the pain.  patient's hypertension on last visit was attributed to noncompliance. However the patient states that she is taking all of her medications. Today she denies any new complaints.   Review of Systems  All other systems reviewed and are negative.       Objective:   Physical Exam  Vitals reviewed. Constitutional: She is oriented to person, place, and time. She appears well-developed and well-nourished.  HENT:  Head: Normocephalic and atraumatic.  Eyes: Pupils are equal, round, and reactive to light.  Neck: Normal range of motion. Neck supple. No JVD present. No thyromegaly present.  Cardiovascular: Normal rate and regular rhythm.   Murmur heard. Pulmonary/Chest: Effort normal and breath sounds normal. She has no wheezes. She has no rales.  Abdominal: Soft. Bowel sounds are normal.  Musculoskeletal: Normal range of motion. She exhibits edema.       Right knee: tenderness found.  Neurological: She is alert and oriented to person, place, and time.  Skin: Skin is warm and dry.          Assessment & Plan:

## 2010-10-31 NOTE — Assessment & Plan Note (Signed)
Patient's he denies any right knee pain, right knee x-ray was within normal limits with only mild degenerative changes shown, she received a steroid injection in her right knee which is relieved the pain significantly. For now we'll continue to monitor advised patient to take naproxen when necessary for pain.

## 2010-10-31 NOTE — Assessment & Plan Note (Addendum)
Patient's blood pressure is elevated today, reviewing her chart patient it is consistently in the 180-200s over 90-100s. Renal ultrasound was done of her kidneys which were normal, renal function is normal, TSH was within normal limits, Today her blood pressure appears to be her baseline. We'll increase her carvedilol 25 twice a day and increase her clonidine to 0.2 twice a day. Recheck in one month. We'll check a basic metabolic panel today. Given the patient's 2-D echo 2007 showing signs for pulmonary hypertension, systolic murmur on exam may be related to tricuspid regurg, she may have obstructive sleep apnea which could be explaining her uncontrolled hypertension. Scheduled for sleep study.

## 2010-10-31 NOTE — Progress Notes (Signed)
[facility] - they will call pt with appt. Stanton Kidney Deneisha Dade RN 4/23/122 11:15AM

## 2010-11-28 ENCOUNTER — Encounter: Payer: Self-pay | Admitting: Internal Medicine

## 2010-11-28 ENCOUNTER — Ambulatory Visit (INDEPENDENT_AMBULATORY_CARE_PROVIDER_SITE_OTHER): Payer: PRIVATE HEALTH INSURANCE | Admitting: Internal Medicine

## 2010-11-28 VITALS — BP 158/78 | HR 69 | Temp 97.5°F | Ht 59.0 in | Wt 200.6 lb

## 2010-11-28 DIAGNOSIS — I10 Essential (primary) hypertension: Secondary | ICD-10-CM

## 2010-11-28 NOTE — Progress Notes (Signed)
  Subjective:    Patient ID: Amanda Roman, female    DOB: 09/25/1940, 70 y.o.   MRN: [id number]  HPI Amanda Roman is a pleasant 70 year woman with past with history of hypertension, osteoarthritis who comes to the clinic for a followup visit for blood pressure check. Her blood pressure today is 158/78 at the walk-in and 122/68 on repeat after 15 minutes. She says that this happens all the time that when she comes in the blood pressure is high and repeat blood pressure is normal. She is on 5 blood-pressure medications and reports taking them regularly as instructed. She denies any chest pain, short of breath, dizziness, headache, back pain, abdominal pain, urinary abnormalities, vision changes, cough, fever, chills, recent weight loss.  Review of Systems    as per history of present illness Objective:   Physical Exam     Constitutional: Vital signs reviewed.  Patient is a well-developed and well-nourished in no acute distress and cooperative with exam. Alert and oriented x3.  Head: Normocephalic and atraumatic Mouth: no erythema or exudates, MMM Eyes: PERRL, EOMI, conjunctivae normal, No scleral icterus.  Neck: Supple, Trachea midline normal ROM, No JVD, mass, thyromegaly, or carotid bruit present.  Cardiovascular: RRR, S1 normal, S2 normal Pulmonary/Chest: CTAB, no wheezes, rales, or rhonchi Abdominal: Soft. Non-tender, non-distended, bowel sounds are normal, no masses, organomegaly, or guarding present.  Musculoskeletal: No joint deformities, erythema, or stiffness, ROM full and no nontender Neurological: A&O x3, Strenght is normal and symmetric bilaterally, cranial nerve II-XII are grossly intact, no focal motor deficit, sensory intact to light touch bilaterally.  Skin: Warm, dry and intact. No rash, cyanosis, or clubbing.      Assessment & Plan:

## 2010-11-28 NOTE — Assessment & Plan Note (Signed)
Initial blood pressure today 158/78 and repeat 122/68-- she says that this is the pattern all the time of being high when she comes in the clinic and the repeat one being normal. Considering her normal blood pressure would not change anything for today. Also she denies any obstructive sleep symptoms in terms of snoring, complains by others of being disturbed during sleep by her snoring, frequent nighttime awakenings due to inability to breathe, incomplete sleep feeling tired after waking up, frequent daytime naps. She also doesn't want a sleep study because didn't feel she is having any snoring and other symptoms and so will hold on for that because the blood pressure is normal for now on all medications. Discussed this with Dr. Aundria Rud and we'll cancel the sleep study.

## 2010-11-28 NOTE — Patient Instructions (Signed)
Please make a followup appointment and 4-6 months. Please continue taking all the medications regularly as you are taking. I will cancel the order for sleep study and he will have to do that for now. If anything comes up meanwhile please make an early appointment.

## 2010-12-26 ENCOUNTER — Telehealth: Payer: Self-pay | Admitting: *Deleted

## 2010-12-26 NOTE — Telephone Encounter (Signed)
Calls for certification papers from [facility], she is transferred to [facility].

## 2011-02-14 ENCOUNTER — Other Ambulatory Visit: Payer: Self-pay | Admitting: Internal Medicine

## 2011-02-14 DIAGNOSIS — I10 Essential (primary) hypertension: Secondary | ICD-10-CM

## 2011-03-01 ENCOUNTER — Other Ambulatory Visit: Payer: Self-pay | Admitting: Internal Medicine

## 2011-03-19 ENCOUNTER — Other Ambulatory Visit: Payer: Self-pay | Admitting: Internal Medicine

## 2011-04-21 ENCOUNTER — Other Ambulatory Visit: Payer: Self-pay | Admitting: Ophthalmology

## 2011-04-21 DIAGNOSIS — Z1231 Encounter for screening mammogram for malignant neoplasm of breast: Secondary | ICD-10-CM

## 2011-05-11 ENCOUNTER — Encounter: Payer: Self-pay | Admitting: Ophthalmology

## 2011-05-11 ENCOUNTER — Ambulatory Visit (INDEPENDENT_AMBULATORY_CARE_PROVIDER_SITE_OTHER): Payer: PRIVATE HEALTH INSURANCE | Admitting: Ophthalmology

## 2011-05-11 VITALS — BP 176/77 | HR 65 | Temp 96.9°F | Ht 59.0 in | Wt 199.9 lb

## 2011-05-11 DIAGNOSIS — Z23 Encounter for immunization: Secondary | ICD-10-CM

## 2011-05-11 DIAGNOSIS — M1990 Unspecified osteoarthritis, unspecified site: Secondary | ICD-10-CM

## 2011-05-11 DIAGNOSIS — Z0000 Encounter for general adult medical examination without abnormal findings: Secondary | ICD-10-CM

## 2011-05-11 DIAGNOSIS — I10 Essential (primary) hypertension: Secondary | ICD-10-CM

## 2011-05-11 NOTE — Assessment & Plan Note (Signed)
Patient is not having any pain currently. She is not taking any pain medications currently.

## 2011-05-11 NOTE — Progress Notes (Deleted)
  Subjective:    Patient ID: Amanda Roman, female    DOB: 1940/07/31, 70 y.o.   MRN: [id number]  HPI    Review of Systems     Objective:   Physical Exam        Assessment & Plan:

## 2011-05-11 NOTE — Patient Instructions (Signed)
Keep exercising and try to increase the time that you are walking towards one hour.  Try to lose weight. You are maintaining your weight well but you would benefit greatly from losing weight and would likely not need as many blood pressure medications.

## 2011-05-11 NOTE — Assessment & Plan Note (Signed)
Gave patient pneumococal vaccine and TDAP today. She already had flu shot at outside pharmacy. Planned to give her shingles vaccine prescription to get placed in [facility] but patient left before I could give her the prescription.

## 2011-05-11 NOTE — Assessment & Plan Note (Signed)
Patients blood pressure was elevated at 176/77 today initially and I rechecked it manually at 164/90 a few minutes later. I was planning to recheck it since it came down to 122/68 at her last appointment but the patient left mistakenly before I could recheck it. Nurse called and asked her to return for BP check in 1 week. Dr. Aundria Rud and I discussed possible changes to her HTN meds if it continues to be high- first, change HCTZ to 25mg  (1 pill not half). If patient is already taking 1 pill, can change HCTZ to 20mg  lasix daily which should be more effective.
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
## 2011-05-22 ENCOUNTER — Ambulatory Visit (HOSPITAL_COMMUNITY)
Admission: RE | Admit: 2011-05-22 | Discharge: 2011-05-22 | Disposition: A | Discharge status: Home / Self Care | Payer: PRIVATE HEALTH INSURANCE | Source: Ambulatory Visit | Attending: Family Medicine | Admitting: Family Medicine

## 2011-05-22 DIAGNOSIS — Z1231 Encounter for screening mammogram for malignant neoplasm of breast: Secondary | ICD-10-CM | POA: Insufficient documentation

## 2011-07-17 ENCOUNTER — Other Ambulatory Visit: Payer: Self-pay | Admitting: Internal Medicine

## 2011-10-25 ENCOUNTER — Emergency Department (INDEPENDENT_AMBULATORY_CARE_PROVIDER_SITE_OTHER)
Admission: EM | Admit: 2011-10-25 | Discharge: 2011-10-25 | Disposition: A | Discharge status: Home / Self Care | Payer: PRIVATE HEALTH INSURANCE | Source: Home / Self Care | Attending: Family Medicine | Admitting: Family Medicine

## 2011-10-25 ENCOUNTER — Encounter (HOSPITAL_COMMUNITY): Payer: Self-pay

## 2011-10-25 DIAGNOSIS — T783XXA Angioneurotic edema, initial encounter: Secondary | ICD-10-CM

## 2011-10-25 MED ORDER — DIPHENHYDRAMINE HCL 25 MG PO CAPS
50.0000 mg | ORAL_CAPSULE | Freq: Once | ORAL | Status: AC
  Administered 2011-10-25: 50 mg via ORAL

## 2011-10-25 MED ORDER — DIPHENHYDRAMINE HCL 25 MG PO CAPS
ORAL_CAPSULE | ORAL | Status: AC
  Filled 2011-10-25: qty 2
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
## 2011-10-25 NOTE — Discharge Instructions (Signed)
Stop the benazapril medication, contact your doctor on thurs to discuss how you are doing and if new medicine is needed.

## 2011-10-25 NOTE — ED Notes (Signed)
Assessed on arrival; lip swollen, NAD, handling secretions w/o observable diff, no swelling in tongue

## 2011-10-25 NOTE — ED Notes (Signed)
Lips swollen

## 2011-10-26 ENCOUNTER — Encounter: Payer: Self-pay | Admitting: Internal Medicine

## 2011-10-26 ENCOUNTER — Ambulatory Visit (INDEPENDENT_AMBULATORY_CARE_PROVIDER_SITE_OTHER): Payer: PRIVATE HEALTH INSURANCE | Admitting: Internal Medicine

## 2011-10-26 ENCOUNTER — Telehealth: Payer: Self-pay | Admitting: *Deleted

## 2011-10-26 VITALS — BP 168/80 | HR 67 | Temp 98.2°F | Ht 59.0 in | Wt 198.3 lb

## 2011-10-26 DIAGNOSIS — T464X5A Adverse effect of angiotensin-converting-enzyme inhibitors, initial encounter: Secondary | ICD-10-CM | POA: Insufficient documentation

## 2011-10-26 DIAGNOSIS — I10 Essential (primary) hypertension: Secondary | ICD-10-CM

## 2011-10-26 DIAGNOSIS — T783XXA Angioneurotic edema, initial encounter: Secondary | ICD-10-CM

## 2011-10-26 DIAGNOSIS — Z0000 Encounter for general adult medical examination without abnormal findings: Secondary | ICD-10-CM

## 2011-10-26 MED ORDER — CARVEDILOL 12.5 MG PO TABS: 25.0000 mg | ORAL_TABLET | Freq: Two times a day (BID) | ORAL | Status: DC

## 2011-10-26 MED ORDER — PREDNISONE 20 MG PO TABS: 40.0000 mg | ORAL_TABLET | Freq: Every day | ORAL | Status: AC
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
## 2011-10-26 NOTE — Assessment & Plan Note (Addendum)
Patient presents with BP 168/80 and her BP drops to 140/76 at recheck.  - stop her ACEI - increase her Coreg from 12.5 BID to 25 BID -follow up with PCP in one month.  - patient has been on 5 antihypertensive medical treatment. I stopped her ACEI and increased her Coreg to 25 mg po BID. If her BP continues to be elevated, may consider workup for secondary hypertension vs medical compliance. She will follow up in one month.

## 2011-10-26 NOTE — Patient Instructions (Signed)
1. Follow up with PCP in one month 2. Stop Benazepril 3. Increase your Carvedilol to 25 mg po twice daily.

## 2011-10-26 NOTE — Assessment & Plan Note (Addendum)
Patient presents with angioedema since yesterday. She states that her lip swelling is better since she was given Benadryl at ED yesterday. The etiology is likely from her ACEI.   - prednisone 40 mg po once given at clinic. - patient is instructed to stop Benazepril  - increase her Coreg to 25 mg po BID - check CBC with Diff, CMP, ESR, CRP, C3, C4.

## 2011-10-26 NOTE — Telephone Encounter (Signed)
Pt states she had an allergic reaction with swelling for which she went to the [facility] yesterday. She was given some Benadryl pills and told to check with her PCP office - that rx could have been to her b/p med. Pt requesting appt today. Offered AM appt, prefers PM due to transportation - unsure when she can come - will call back when she contacts transportation. Urgent care note states pt had "angioedema of lips" with no shortness of breath or other discomfort.  Pt given appt for 1:45 P with Dr. Dierdre Searles today.

## 2011-10-27 LAB — C-REACTIVE PROTEIN: CRP: 0.29 mg/dL (ref <0.60)

## 2011-10-27 LAB — COMPLETE METABOLIC PANEL WITH GFR
AST: 19 U/L (ref 0–37)
Albumin: 4.3 g/dL (ref 3.5–5.2)
Alkaline Phosphatase: 67 U/L (ref 39–117)
GFR, Est Non African American: >89 mL/min
Glucose, Bld: 95 mg/dL (ref 70–99)
Potassium: 3.3 mEq/L — ABNORMAL LOW (ref 3.5–5.3)
Sodium: 139 mEq/L (ref 135–145)
Total Bilirubin: 0.4 mg/dL (ref 0.3–1.2)
Total Protein: 7.9 g/dL (ref 6.0–8.3)

## 2011-10-27 LAB — DIFFERENTIAL
Basophils Absolute: 0.1 10*3/uL (ref 0.0–0.1)
Basophils Relative: 1 % (ref 0–1)
Eosinophils Absolute: 0.2 10*3/uL (ref 0.0–0.7)
Eosinophils Relative: 4 % (ref 0–5)
Neutrophils Relative %: 45 % (ref 43–77)

## 2011-10-27 LAB — CBC
MCH: 30.4 pg (ref 26.0–34.0)
MCV: 91.7 fL (ref 78.0–100.0)
Platelets: 300 10*3/uL (ref 150–400)
RDW: 13.6 % (ref 11.5–15.5)
WBC: 6.3 10*3/uL (ref 4.0–10.5)

## 2011-10-27 LAB — LIPID PANEL
Cholesterol: 179 mg/dL (ref 0–200)
HDL: 66 mg/dL (ref >39)
Total CHOL/HDL Ratio: 2.7 Ratio
VLDL: 18 mg/dL (ref 0–40)

## 2011-10-31 ENCOUNTER — Telehealth: Payer: Self-pay | Admitting: Internal Medicine

## 2011-10-31 MED ORDER — POTASSIUM CHLORIDE ER 10 MEQ PO TBCR: 40.0000 meq | EXTENDED_RELEASE_TABLET | Freq: Every day | ORAL | Status: DC

## 2011-10-31 NOTE — Telephone Encounter (Signed)
Patient called and informed her K level and instructed to pick up her potassium tablets at [facility].

## 2011-11-23 ENCOUNTER — Other Ambulatory Visit: Payer: Self-pay | Admitting: *Deleted

## 2011-11-23 NOTE — Telephone Encounter (Signed)
Please change Rx for coreg. To 25 mg

## 2011-11-24 ENCOUNTER — Encounter: Payer: Self-pay | Admitting: Ophthalmology

## 2011-11-24 ENCOUNTER — Ambulatory Visit (INDEPENDENT_AMBULATORY_CARE_PROVIDER_SITE_OTHER): Payer: PRIVATE HEALTH INSURANCE | Admitting: Ophthalmology

## 2011-11-24 VITALS — BP 141/83 | HR 77 | Temp 97.4°F | Ht 59.0 in | Wt 200.1 lb

## 2011-11-24 DIAGNOSIS — Z0000 Encounter for general adult medical examination without abnormal findings: Secondary | ICD-10-CM

## 2011-11-24 DIAGNOSIS — I10 Essential (primary) hypertension: Secondary | ICD-10-CM

## 2011-11-24 DIAGNOSIS — R7303 Prediabetes: Secondary | ICD-10-CM

## 2011-11-24 DIAGNOSIS — Z79899 Other long term (current) drug therapy: Secondary | ICD-10-CM

## 2011-11-24 LAB — POCT GLYCOSYLATED HEMOGLOBIN (HGB A1C): Hemoglobin A1C: 6.1

## 2011-11-24 NOTE — Patient Instructions (Addendum)
-  Please try to walk 30 minutes 3-4 a week -check blood pressure once a week at home

## 2011-11-24 NOTE — Assessment & Plan Note (Addendum)
Patient is compliant with her meds and brings them all the appointment. Her BP is initially high but it comes down into the normal range on recheck. I asked her to get a home BP monitor and check her BP once a week and bring in her log since she seems to have white coat hypertension since it has come down significantly on several past visits. I did not make any changes to her regimen at this point since she is reasonably well controlled. Could consider if we could put her on a combination pill to reduce the number of BP pills. I also asked her to start walking regularly since she does not get regular exercise.

## 2011-11-24 NOTE — Assessment & Plan Note (Signed)
I checked a HGA1c today and she is in the prediabetic range with A1c 6.0. Advised her that she is considered obese and should try to walk 4 times a week to try and lose some weight so she does not develop diabetes.
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
## 2011-11-27 MED ORDER — CARVEDILOL 25 MG PO TABS: 25.0000 mg | ORAL_TABLET | Freq: Two times a day (BID) | ORAL | Status: DC

## 2012-02-15 ENCOUNTER — Other Ambulatory Visit: Payer: Self-pay | Admitting: *Deleted

## 2012-02-15 DIAGNOSIS — I10 Essential (primary) hypertension: Secondary | ICD-10-CM

## 2012-02-15 MED ORDER — CLONIDINE HCL 0.2 MG PO TABS: 0.2000 mg | ORAL_TABLET | Freq: Two times a day (BID) | ORAL | Status: DC

## 2012-02-15 NOTE — Telephone Encounter (Signed)
Seen 11/2011 and asked for 3 month F/U. Doesn't have to be that soon but would ask front desk to make appt next couple of months as BP was just above goal.

## 2012-02-24 ENCOUNTER — Other Ambulatory Visit: Payer: Self-pay | Admitting: Internal Medicine

## 2012-02-24 DIAGNOSIS — I10 Essential (primary) hypertension: Secondary | ICD-10-CM

## 2012-03-24 ENCOUNTER — Other Ambulatory Visit: Payer: Self-pay | Admitting: Internal Medicine

## 2012-03-24 DIAGNOSIS — I10 Essential (primary) hypertension: Secondary | ICD-10-CM

## 2012-05-02 ENCOUNTER — Encounter: Payer: Self-pay | Admitting: Internal Medicine

## 2012-05-02 ENCOUNTER — Ambulatory Visit (INDEPENDENT_AMBULATORY_CARE_PROVIDER_SITE_OTHER): Payer: PRIVATE HEALTH INSURANCE | Admitting: Internal Medicine

## 2012-05-02 VITALS — BP 164/90 | HR 79 | Temp 97.4°F | Wt 199.4 lb

## 2012-05-02 DIAGNOSIS — T300 Burn of unspecified body region, unspecified degree: Secondary | ICD-10-CM

## 2012-05-02 DIAGNOSIS — K649 Unspecified hemorrhoids: Secondary | ICD-10-CM | POA: Insufficient documentation

## 2012-05-02 DIAGNOSIS — I872 Venous insufficiency (chronic) (peripheral): Secondary | ICD-10-CM

## 2012-05-02 DIAGNOSIS — M1990 Unspecified osteoarthritis, unspecified site: Secondary | ICD-10-CM

## 2012-05-02 DIAGNOSIS — Z0000 Encounter for general adult medical examination without abnormal findings: Secondary | ICD-10-CM

## 2012-05-02 DIAGNOSIS — Z1231 Encounter for screening mammogram for malignant neoplasm of breast: Secondary | ICD-10-CM

## 2012-05-02 DIAGNOSIS — D126 Benign neoplasm of colon, unspecified: Secondary | ICD-10-CM

## 2012-05-02 DIAGNOSIS — M899 Disorder of bone, unspecified: Secondary | ICD-10-CM

## 2012-05-02 DIAGNOSIS — E876 Hypokalemia: Secondary | ICD-10-CM

## 2012-05-02 DIAGNOSIS — I10 Essential (primary) hypertension: Secondary | ICD-10-CM

## 2012-05-02 LAB — BASIC METABOLIC PANEL WITH GFR
BUN: 9 mg/dL (ref 6–23)
Calcium: 10.5 mg/dL (ref 8.4–10.5)
GFR, Est African American: >89 mL/min
GFR, Est Non African American: >89 mL/min
Potassium: 3.3 mEq/L — ABNORMAL LOW (ref 3.5–5.3)
Sodium: 140 mEq/L (ref 135–145)

## 2012-05-02 MED ORDER — HYDROCHLOROTHIAZIDE 25 MG PO TABS: 25.0000 mg | ORAL_TABLET | Freq: Every day | ORAL | Status: DC

## 2012-05-02 MED ORDER — ASPIRIN 81 MG PO CHEW: 81.0000 mg | CHEWABLE_TABLET | Freq: Every day | ORAL | Status: DC

## 2012-05-02 MED ORDER — POTASSIUM CHLORIDE ER 10 MEQ PO TBCR: 40.0000 meq | EXTENDED_RELEASE_TABLET | Freq: Every day | ORAL | Status: DC

## 2012-05-02 MED ORDER — CLONIDINE HCL 0.3 MG PO TABS: 0.3000 mg | ORAL_TABLET | Freq: Two times a day (BID) | ORAL | Status: DC

## 2012-05-02 NOTE — Patient Instructions (Signed)
It was nice to meet you.  I look forward to taking care of you for years to come.  1) I will increase your Clonidine to 0.3 mg twice a day for your blood pressure.  2) I will decrease your hydrochlorothiazide blood pressure medications to 25 mg once a day so you do not loose too much potassium.  3) Think about your bones and the bone scan we talked about.  We will hold off now but I will ask you about it at the next visit.  I am worried you may have osteoporosis.  4) I will see in in 3 months to follow-up your blood pressure.

## 2012-05-02 NOTE — Assessment & Plan Note (Signed)
Repeat surveillance colonoscopy due in 2016.

## 2012-05-02 NOTE — Assessment & Plan Note (Signed)
Ms. Hobbs is not interested in a repeat DEXA at this time secondary to concerns about cost.  She was asked to think about this in the interim as she may now have osteoporosis and be a candidate for bisphosphonate therapy.  If cost remains an issue I will consider calculating a fracture risk score and see if she would qualify for a bisphosphonate at that point anyway if she would consider pharmacologic therapy.

## 2012-05-02 NOTE — Assessment & Plan Note (Signed)
Well controlled with leg elevation PRN.  Will continue these measures.

## 2012-05-02 NOTE — Assessment & Plan Note (Signed)
Clinically insignificant discomfort per the patient.  Will continue to monitor without anti-inflammatory therapy.

## 2012-05-02 NOTE — Assessment & Plan Note (Signed)
Poor control at this time despite amlodipine 10 mg PO QD, HCTZ 50 mg PO QAM, carvedilol 25 mg PO BID, and clonidine 0.2 mg PO BID.  The HCTZ at 50 mg PO QAM is unlikely doing much more than the 25 mg PO QAM dose except worsening the hypokalemia.  We will therefore decrease the HCTZ to 25 mg PO QAM.  We will also increase the clonidine to 0.3 mg PO BID.  The amlodipine and carvedilol will be continued at the current doses (maximum).  The hypokalemia should improve with the decrease in the HCTZ dose and continued potassium supplementation at the current dose.  We will recheck the blood pressure control and potassium level at the follow-up visit in 3 months.

## 2012-05-02 NOTE — Assessment & Plan Note (Signed)
Mammogram has been ordered.  DEXA scan discussed as outlined under osteopenia.  Vaccinations and colonoscopy up to date at this point.  She is not interested in any further Pap smears as she has had multiple negative examinations and is currently abstinent.
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
## 2012-05-03 ENCOUNTER — Encounter: Payer: PRIVATE HEALTH INSURANCE | Admitting: Internal Medicine

## 2012-05-29 ENCOUNTER — Ambulatory Visit (HOSPITAL_COMMUNITY)
Admission: RE | Admit: 2012-05-29 | Discharge: 2012-05-29 | Disposition: A | Discharge status: Home / Self Care | Payer: PRIVATE HEALTH INSURANCE | Source: Ambulatory Visit | Attending: Internal Medicine | Admitting: Internal Medicine

## 2012-05-29 DIAGNOSIS — Z1231 Encounter for screening mammogram for malignant neoplasm of breast: Secondary | ICD-10-CM

## 2012-09-13 ENCOUNTER — Ambulatory Visit: Payer: PRIVATE HEALTH INSURANCE | Admitting: Internal Medicine

## 2012-11-08 ENCOUNTER — Encounter: Payer: Self-pay | Admitting: Internal Medicine

## 2012-11-08 ENCOUNTER — Ambulatory Visit (INDEPENDENT_AMBULATORY_CARE_PROVIDER_SITE_OTHER): Payer: PRIVATE HEALTH INSURANCE | Admitting: Internal Medicine

## 2012-11-08 VITALS — BP 145/81 | HR 59 | Temp 97.8°F | Ht <= 58 in | Wt 202.5 lb

## 2012-11-08 DIAGNOSIS — M949 Disorder of cartilage, unspecified: Secondary | ICD-10-CM

## 2012-11-08 DIAGNOSIS — M899 Disorder of bone, unspecified: Secondary | ICD-10-CM

## 2012-11-08 DIAGNOSIS — I10 Essential (primary) hypertension: Secondary | ICD-10-CM

## 2012-11-08 DIAGNOSIS — M8580 Other specified disorders of bone density and structure, unspecified site: Secondary | ICD-10-CM

## 2012-11-08 LAB — BASIC METABOLIC PANEL WITHOUT GFR
BUN: 10 mg/dL (ref 6–23)
CO2: 32 meq/L (ref 19–32)
Calcium: 10.3 mg/dL (ref 8.4–10.5)
Chloride: 99 meq/L (ref 96–112)
Creat: 0.69 mg/dL (ref 0.50–1.10)
GFR, Est African American: >89 mL/min
GFR, Est Non African American: 87 mL/min
Glucose, Bld: 116 mg/dL — ABNORMAL HIGH (ref 70–99)
Potassium: 3.6 meq/L (ref 3.5–5.3)
Sodium: 138 meq/L (ref 135–145)

## 2012-11-08 NOTE — Progress Notes (Signed)
  Subjective:    Patient ID: Amanda Roman, female    DOB: 03/31/1941, 72 y.o.   MRN: [id number]  HPI  Please see the A&P for the status of the pt's chronic medical problems.  Review of Systems  Constitutional: Negative for activity change and fatigue.  Respiratory: Negative for chest tightness and shortness of breath.   Cardiovascular: Negative for chest pain and leg swelling.  Gastrointestinal: Negative for abdominal pain.  Musculoskeletal: Positive for back pain and arthralgias. Negative for myalgias, joint swelling and gait problem.  Neurological: Negative for dizziness, syncope and weakness.  Hematological: Negative for adenopathy.  Psychiatric/Behavioral: Negative for dysphoric mood. The patient is not nervous/anxious.       Objective:   Physical Exam  Nursing note and vitals reviewed. Constitutional: She is oriented to person, place, and time. She appears well-developed and well-nourished. No distress.  HENT:  Head: Normocephalic and atraumatic.  Mouth/Throat: Oropharynx is clear and moist. No oropharyngeal exudate.  Eyes: Conjunctivae are normal. Right eye exhibits no discharge. Left eye exhibits no discharge. No scleral icterus.  Neck: Normal range of motion. Neck supple.  Cardiovascular: Normal rate, regular rhythm and normal heart sounds.  Exam reveals no gallop and no friction rub.   No murmur heard. Pulmonary/Chest: Effort normal and breath sounds normal. No stridor. No respiratory distress. She has no wheezes. She has no rales.  Abdominal: Soft. Bowel sounds are normal. She exhibits no distension. There is no tenderness. There is no rebound and no guarding.  Musculoskeletal: Normal range of motion. She exhibits no edema and no tenderness.  Lymphadenopathy:    She has no cervical adenopathy.  Neurological: She is alert and oriented to person, place, and time.  Skin: Skin is warm and dry. No rash noted. She is not diaphoretic. No erythema.  Psychiatric: She has a  normal mood and affect. Her behavior is normal. Judgment and thought content normal.      Assessment & Plan:   Please see Problem Oriented Charting.

## 2012-11-08 NOTE — Assessment & Plan Note (Signed)
She increased the clonidine to 0.3 mg by mouth twice daily since the last visit. She also continued the amlodipine 10 mg by mouth daily, carvedilol 25 mg by mouth twice daily, and hydrochlorothiazide 25 mg by mouth daily. She has tolerated this regimen well without any side effects. Her blood pressure today was markedly improved although not at target.  It was 145/81. We discussed adding another medication to her regimen and she was not interested at this time. She wanted to work on being more active in hopes of losing some weight and decreasing her blood pressure to target, since we are so close. She will therefore continue her current regimen. Along the lines of increased activity, she plans on accompanying her sister to [facility] in June. She is very excited about this trip and suspects she will do a lot of walking. She was encouraged to continue to remain active as a way to try to lose weight and decrease her blood pressure.

## 2012-11-08 NOTE — Patient Instructions (Signed)
It was great to see you again.  You are doing well.  1) Keep taking your medications as you are.  2) We checked your blood today.  I will call you if we have any concerns.  I will see you back in 6 months, sooner if necessary.

## 2012-11-08 NOTE — Assessment & Plan Note (Signed)
She is due for a repeat DEXA scan given her previous diagnosis of osteopenia. She still is not interested in having the study done because of concerns over the costs that she will incur. She was asked to continue her calcium and vitamin D. We will discuss this topic at a future date when it may be more conducive financially to order the test.

## 2012-11-11 NOTE — Progress Notes (Signed)
BMP unremarkable.  Continue current regimen.

## 2012-12-06 ENCOUNTER — Other Ambulatory Visit: Payer: Self-pay | Admitting: *Deleted

## 2012-12-06 DIAGNOSIS — I10 Essential (primary) hypertension: Secondary | ICD-10-CM

## 2012-12-06 MED ORDER — CARVEDILOL 25 MG PO TABS: 25.0000 mg | ORAL_TABLET | Freq: Two times a day (BID) | ORAL | Status: DC

## 2013-03-27 ENCOUNTER — Other Ambulatory Visit: Payer: Self-pay | Admitting: *Deleted

## 2013-03-27 DIAGNOSIS — I10 Essential (primary) hypertension: Secondary | ICD-10-CM

## 2013-03-27 MED ORDER — AMLODIPINE BESYLATE 10 MG PO TABS: 10.0000 mg | ORAL_TABLET | Freq: Every day | ORAL | Status: DC

## 2013-03-27 MED ORDER — HYDROCHLOROTHIAZIDE 25 MG PO TABS: 25.0000 mg | ORAL_TABLET | Freq: Every day | ORAL | Status: DC

## 2013-03-27 NOTE — Telephone Encounter (Signed)
States she will be out by tomorrow.  Thanks

## 2013-05-01 ENCOUNTER — Other Ambulatory Visit: Payer: Self-pay | Admitting: Internal Medicine

## 2013-05-01 DIAGNOSIS — Z1231 Encounter for screening mammogram for malignant neoplasm of breast: Secondary | ICD-10-CM

## 2013-05-07 ENCOUNTER — Other Ambulatory Visit: Payer: Self-pay | Admitting: *Deleted

## 2013-05-07 DIAGNOSIS — I10 Essential (primary) hypertension: Secondary | ICD-10-CM

## 2013-05-07 DIAGNOSIS — E876 Hypokalemia: Secondary | ICD-10-CM

## 2013-05-07 MED ORDER — CLONIDINE HCL 0.3 MG PO TABS: 0.3000 mg | ORAL_TABLET | Freq: Two times a day (BID) | ORAL | Status: DC

## 2013-05-07 MED ORDER — POTASSIUM CHLORIDE ER 20 MEQ PO TBCR: 40.0000 meq | EXTENDED_RELEASE_TABLET | Freq: Every day | ORAL | Status: DC

## 2013-06-02 ENCOUNTER — Ambulatory Visit (HOSPITAL_COMMUNITY)
Admission: RE | Admit: 2013-06-02 | Discharge: 2013-06-02 | Disposition: A | Discharge status: Home / Self Care | Payer: PRIVATE HEALTH INSURANCE | Source: Ambulatory Visit | Attending: Internal Medicine | Admitting: Internal Medicine

## 2013-06-02 DIAGNOSIS — Z1231 Encounter for screening mammogram for malignant neoplasm of breast: Secondary | ICD-10-CM

## 2013-11-07 ENCOUNTER — Encounter: Payer: Self-pay | Admitting: Internal Medicine

## 2013-11-07 ENCOUNTER — Ambulatory Visit (INDEPENDENT_AMBULATORY_CARE_PROVIDER_SITE_OTHER): Payer: PRIVATE HEALTH INSURANCE | Admitting: Internal Medicine

## 2013-11-07 VITALS — BP 134/66 | HR 70 | Temp 97.9°F | Wt 203.4 lb

## 2013-11-07 DIAGNOSIS — M8580 Other specified disorders of bone density and structure, unspecified site: Secondary | ICD-10-CM

## 2013-11-07 DIAGNOSIS — M899 Disorder of bone, unspecified: Secondary | ICD-10-CM

## 2013-11-07 DIAGNOSIS — M159 Polyosteoarthritis, unspecified: Secondary | ICD-10-CM

## 2013-11-07 DIAGNOSIS — M949 Disorder of cartilage, unspecified: Secondary | ICD-10-CM

## 2013-11-07 DIAGNOSIS — D126 Benign neoplasm of colon, unspecified: Secondary | ICD-10-CM

## 2013-11-07 DIAGNOSIS — Z0000 Encounter for general adult medical examination without abnormal findings: Secondary | ICD-10-CM

## 2013-11-07 DIAGNOSIS — I872 Venous insufficiency (chronic) (peripheral): Secondary | ICD-10-CM

## 2013-11-07 DIAGNOSIS — I10 Essential (primary) hypertension: Secondary | ICD-10-CM

## 2013-11-07 LAB — BASIC METABOLIC PANEL WITH GFR
BUN: 10 mg/dL (ref 6–23)
CO2: 31 meq/L (ref 19–32)
CREATININE: 0.65 mg/dL (ref 0.50–1.10)
Calcium: 9.7 mg/dL (ref 8.4–10.5)
Chloride: 99 mEq/L (ref 96–112)
GFR, Est African American: >89 mL/min
GFR, Est Non African American: 88 mL/min
Glucose, Bld: 146 mg/dL — ABNORMAL HIGH (ref 70–99)
Potassium: 3.7 mEq/L (ref 3.5–5.3)
Sodium: 139 mEq/L (ref 135–145)

## 2013-11-07 LAB — LIPID PANEL
CHOLESTEROL: 192 mg/dL (ref 0–200)
HDL: 79 mg/dL (ref >39)
LDL Cholesterol: 98 mg/dL (ref 0–99)
Total CHOL/HDL Ratio: 2.4 Ratio
Triglycerides: 73 mg/dL (ref <150)
VLDL: 15 mg/dL (ref 0–40)

## 2013-11-07 MED ORDER — ZOSTER VACCINE LIVE 19400 UNT/0.65ML ~~LOC~~ SOLR: 0.6500 mL | Freq: Once | SUBCUTANEOUS | Status: DC
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
## 2013-11-07 NOTE — Assessment & Plan Note (Signed)
She is faithfully taking the calcium and vitamin D for her osteopenia. She's not interested in a DEXA scan at this time, even though she is due, secondary to concerns over costs. She was encouraged to continue to take the calcium and vitamin D and we will readdress the issue of DEXA scan monitoring at the followup visit.

## 2013-11-07 NOTE — Assessment & Plan Note (Signed)
She is interested in obtaining a Zostavax. A prescription was written for her to take to the pharmacy to get this vaccination. Otherwise she is up-to-date on her health maintenance.

## 2013-11-07 NOTE — Assessment & Plan Note (Signed)
Her last colonoscopy in 2011 was without any polyps. She is due for a repeat surveillance colonoscopy in 2016. A referral will be made for this procedure at the followup visit in one year.

## 2013-11-07 NOTE — Assessment & Plan Note (Signed)
She continues to have mainly chronic low back pain associated with house cleaning or bending over. When she is sitting around she is without any pain. Given the intermittent nature of the mild discomfort she wishes to remain off any scheduled medication for the time being. We will followup on her arthritic symptoms at the return visit.

## 2013-11-07 NOTE — Assessment & Plan Note (Signed)
She continues to have bilateral lower extremity edema secondary to her chronic venous insufficiency. When she is at home she keeps her legs elevated and does not find this to be problematic. We will therefore continue conservative symptomatic therapy with elevation of the legs. Should this worsen, she may be a candidate for compression stockings moving forward.

## 2013-11-07 NOTE — Assessment & Plan Note (Signed)
She is tolerating the amlodipine 10 mg by mouth daily, carvedilol 25 mg by mouth twice daily, clonidine 0.3 mg by mouth twice daily, and hydrochlorothiazide 25 mg by mouth daily. On this regimen her blood pressure is at target. We will therefore continue these four antihypertensive medications at the current doses. We will followup the blood pressure control in one year at the return appointment. A basic metabolic panel was drawn given her hypertension and hydrochlorothiazide use and is pending at the time of this dictation.

## 2013-11-07 NOTE — Patient Instructions (Signed)
It was great to see you again.  You are doing a good job caring for yourself.  1) Keep taking the medications as you are.  Your blood pressure when measured at home was excellent.  2) I checked your cholesterol and kidney function today.  I will call you early next week if there is anything worrisome.  3) Take the vaccine to the drug store for the shingles.  I will see you back in 1 year, sooner if necessary.

## 2013-11-28 NOTE — Progress Notes (Signed)
BMP: Glucose 146  Total cholesterol 192 Triglycerides 73 HDL 79 LDL 98  Unclear if elevated glucose is fasting.  I tried calling Amanda Roman and only received a voice mail message.  I will try calling her next week to clarify the fasting status on this sample.  The lipids are at target.

## 2013-12-31 NOTE — Progress Notes (Signed)
Patient states she was not fasting prior to these labs being drawn.  Thus, the slight elevation in the glucose may be secondary to being in a non-fasting state.  We will follow this up in the future with a fasting blood glucose.

## 2014-02-06 ENCOUNTER — Encounter: Payer: Self-pay | Admitting: Internal Medicine

## 2014-02-06 ENCOUNTER — Ambulatory Visit (HOSPITAL_COMMUNITY)
Admission: RE | Admit: 2014-02-06 | Discharge: 2014-02-06 | Disposition: A | Discharge status: Home / Self Care | Payer: PRIVATE HEALTH INSURANCE | Source: Ambulatory Visit | Attending: Internal Medicine | Admitting: Internal Medicine

## 2014-02-06 ENCOUNTER — Ambulatory Visit (INDEPENDENT_AMBULATORY_CARE_PROVIDER_SITE_OTHER): Payer: PRIVATE HEALTH INSURANCE | Admitting: Internal Medicine

## 2014-02-06 VITALS — BP 135/63 | HR 63 | Temp 98.9°F | Wt 200.3 lb

## 2014-02-06 DIAGNOSIS — M25519 Pain in unspecified shoulder: Secondary | ICD-10-CM | POA: Diagnosis present

## 2014-02-06 DIAGNOSIS — I10 Essential (primary) hypertension: Secondary | ICD-10-CM

## 2014-02-06 DIAGNOSIS — M25512 Pain in left shoulder: Secondary | ICD-10-CM

## 2014-02-06 DIAGNOSIS — M810 Age-related osteoporosis without current pathological fracture: Secondary | ICD-10-CM | POA: Diagnosis not present

## 2014-02-06 MED ORDER — NAPROXEN 375 MG PO TABS: 375.0000 mg | ORAL_TABLET | Freq: Two times a day (BID) | ORAL | Status: AC
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
## 2014-02-06 NOTE — Assessment & Plan Note (Signed)
Well controlled on the current multi-drug regimen.  Plans: Continue current regimen.

## 2014-02-06 NOTE — Patient Instructions (Signed)
Please have the X-ray of your left shoulder done. Take Naproxen as recommended with a meal.

## 2014-02-06 NOTE — Assessment & Plan Note (Signed)
Left shoulder pain of one week duration, with active full range of movements at the left shoulder. Suspect her symptoms related to OA involving glenohumeral joint. No symptoms or clinical signs suggestive of acute joint or rotator cuff tendinopathy. Discussed with the attending regarding further management.  Plans: X-ray left shoulder. Naproxen 375 mg po BID with meals. Follow up as needed or if symptoms worsen.

## 2014-02-09 NOTE — Progress Notes (Signed)
INTERNAL MEDICINE TEACHING ATTENDING ADDENDUM - Chasin Findling, MD: I reviewed and discussed at the time of visit with the resident Dr. Boggala, the patient's medical history, physical examination, diagnosis and results of pertinent tests and treatment and I agree with the patient's care as documented. 

## 2014-03-06 ENCOUNTER — Other Ambulatory Visit: Payer: Self-pay | Admitting: *Deleted

## 2014-03-06 DIAGNOSIS — E876 Hypokalemia: Secondary | ICD-10-CM

## 2014-03-06 DIAGNOSIS — I10 Essential (primary) hypertension: Secondary | ICD-10-CM

## 2014-03-06 MED ORDER — HYDROCHLOROTHIAZIDE 25 MG PO TABS: 25.0000 mg | ORAL_TABLET | Freq: Every day | ORAL | Status: DC

## 2014-03-06 MED ORDER — POTASSIUM CHLORIDE ER 20 MEQ PO TBCR: 40.0000 meq | EXTENDED_RELEASE_TABLET | Freq: Every day | ORAL | Status: DC

## 2014-03-06 MED ORDER — CARVEDILOL 25 MG PO TABS: 25.0000 mg | ORAL_TABLET | Freq: Two times a day (BID) | ORAL | Status: DC

## 2014-03-06 MED ORDER — AMLODIPINE BESYLATE 10 MG PO TABS: 10.0000 mg | ORAL_TABLET | Freq: Every day | ORAL | Status: DC

## 2014-04-20 ENCOUNTER — Other Ambulatory Visit: Payer: Self-pay | Admitting: *Deleted

## 2014-04-20 DIAGNOSIS — I10 Essential (primary) hypertension: Secondary | ICD-10-CM

## 2014-04-22 MED ORDER — CLONIDINE HCL 0.3 MG PO TABS: 0.3000 mg | ORAL_TABLET | Freq: Two times a day (BID) | ORAL | Status: DC

## 2014-05-08 ENCOUNTER — Encounter: Payer: Self-pay | Admitting: Internal Medicine

## 2014-05-08 ENCOUNTER — Ambulatory Visit (INDEPENDENT_AMBULATORY_CARE_PROVIDER_SITE_OTHER): Payer: PRIVATE HEALTH INSURANCE | Admitting: Internal Medicine

## 2014-05-08 VITALS — BP 126/72 | HR 64 | Temp 98.2°F | Ht <= 58 in | Wt 196.0 lb

## 2014-05-08 DIAGNOSIS — Z6841 Body Mass Index (BMI) 40.0 and over, adult: Secondary | ICD-10-CM

## 2014-05-08 DIAGNOSIS — M8580 Other specified disorders of bone density and structure, unspecified site: Secondary | ICD-10-CM

## 2014-05-08 DIAGNOSIS — I10 Essential (primary) hypertension: Secondary | ICD-10-CM

## 2014-05-08 DIAGNOSIS — Z23 Encounter for immunization: Secondary | ICD-10-CM

## 2014-05-08 DIAGNOSIS — D126 Benign neoplasm of colon, unspecified: Secondary | ICD-10-CM

## 2014-05-08 DIAGNOSIS — M159 Polyosteoarthritis, unspecified: Secondary | ICD-10-CM

## 2014-05-08 DIAGNOSIS — I872 Venous insufficiency (chronic) (peripheral): Secondary | ICD-10-CM

## 2014-05-08 DIAGNOSIS — M150 Primary generalized (osteo)arthritis: Secondary | ICD-10-CM
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
## 2014-05-08 NOTE — Patient Instructions (Signed)
It was good to see you again.  You are taking great care of yourself.  1) Keep eating the fruits and vegetables as you are doing.  Also keep walking in the neighborhood with your friends.  You have done a great job with this and lost 7 pounds.  2) Keep taking the medications as you are.  If you continue to loose weight I may be able to start lowering the doses of your blood pressure medications.  3) We gave you one last pneumonia shot today.  I will see you back in 6 months, sooner if necessary.

## 2014-05-08 NOTE — Assessment & Plan Note (Signed)
With her history of tubular adenoma in 2006 and the negative repeat colonoscopy in 2011 she is due for surveillance colonoscopy in 2016. We will offer this procedure at the follow-up visit and schedule if she remains interested.

## 2014-05-08 NOTE — Assessment & Plan Note (Signed)
She received her flu vaccination last month at her local pharmacy, the [facility] on [address]. I gave her the pneumococcal-13 vaccination today. She is otherwise up-to-date on her health care maintenance.

## 2014-05-08 NOTE — Assessment & Plan Note (Signed)
Her blood pressure today was well within target. This is on amlodipine 10 mg by mouth daily, carvedilol 25 mg by mouth twice daily, clonidine 0.3 mg twice daily, and hydrochlorothiazide 25 mg by mouth daily. We will continue this regimen at the current doses, although she was encouraged to continue to lose weight which may allow [place] to wean some of these antihypertensive medications. My first choice would be to wean the clonidine to daily. We will reassess the blood pressure control at the follow-up visit.

## 2014-05-08 NOTE — Assessment & Plan Note (Signed)
She is due for reassessment of her osteopenia with a DEXA scan. We again discussed this and she continues to want to defer secondary to concerns over cost. In the meantime, she will continue her calcium-vitamin D and multivitamin. I will bring the issue up at the follow-up visit.

## 2014-05-08 NOTE — Assessment & Plan Note (Signed)
Her knee and left shoulder arthritis have been relatively well controlled on infrequent as needed Naprosyn. We will therefore continue the Naprosyn 375 mg by mouth twice daily with food as needed.

## 2014-05-08 NOTE — Progress Notes (Signed)
   Subjective:    Patient ID: Amanda Roman, female    DOB: 18-Aug-1940, 73 y.o.   MRN: [id number]  HPI  Please see the A&P for the status of the pt's chronic medical problems.  Review of Systems  Constitutional: Negative for activity change, appetite change and unexpected weight change.  Respiratory: Negative for chest tightness, shortness of breath and wheezing.   Cardiovascular: Positive for leg swelling. Negative for chest pain and palpitations.  Gastrointestinal: Negative for nausea, vomiting, abdominal pain, diarrhea, constipation and abdominal distention.  Musculoskeletal: Negative for arthralgias, back pain, joint swelling, myalgias, neck pain and neck stiffness.  Psychiatric/Behavioral: Negative for dysphoric mood. The patient is not nervous/anxious.       Objective:   Physical Exam  Nursing note and vitals reviewed. Constitutional: She is oriented to person, place, and time. She appears well-developed and well-nourished. No distress.  HENT:  Head: Normocephalic and atraumatic.  Eyes: Conjunctivae are normal. Right eye exhibits no discharge. Left eye exhibits no discharge. No scleral icterus.  Cardiovascular: Normal rate and regular rhythm.  Exam reveals no gallop and no friction rub.   Murmur heard. Pulmonary/Chest: Effort normal and breath sounds normal. No respiratory distress. She has no wheezes. She has no rales.  Abdominal: Soft. Bowel sounds are normal. She exhibits no distension. There is no tenderness. There is no rebound and no guarding.  Musculoskeletal: Normal range of motion. She exhibits edema. She exhibits no tenderness.  Neurological: She is alert and oriented to person, place, and time. She exhibits normal muscle tone.  Skin: Skin is warm and dry. She is not diaphoretic. No erythema.  Psychiatric: She has a normal mood and affect. Her behavior is normal. Judgment and thought content normal.      Assessment & Plan:   Please see Problem Oriented Charting.

## 2014-05-08 NOTE — Assessment & Plan Note (Signed)
She has lost 7 pounds over the last 6 months. She was praised at this success and encouraged to continue to follow her diet as well as walking in her neighborhood with her friends.

## 2014-08-19 ENCOUNTER — Encounter: Payer: Self-pay | Admitting: *Deleted

## 2014-11-05 ENCOUNTER — Encounter: Payer: Self-pay | Admitting: Internal Medicine

## 2014-11-05 ENCOUNTER — Ambulatory Visit (INDEPENDENT_AMBULATORY_CARE_PROVIDER_SITE_OTHER): Payer: Commercial Managed Care - HMO | Admitting: Internal Medicine

## 2014-11-05 VITALS — BP 138/88 | HR 71 | Temp 98.0°F | Wt 192.1 lb

## 2014-11-05 DIAGNOSIS — I10 Essential (primary) hypertension: Secondary | ICD-10-CM

## 2014-11-05 DIAGNOSIS — M159 Polyosteoarthritis, unspecified: Secondary | ICD-10-CM

## 2014-11-05 DIAGNOSIS — Z6841 Body Mass Index (BMI) 40.0 and over, adult: Secondary | ICD-10-CM | POA: Diagnosis not present

## 2014-11-05 DIAGNOSIS — D126 Benign neoplasm of colon, unspecified: Secondary | ICD-10-CM

## 2014-11-05 DIAGNOSIS — M8580 Other specified disorders of bone density and structure, unspecified site: Secondary | ICD-10-CM

## 2014-11-05 DIAGNOSIS — E876 Hypokalemia: Secondary | ICD-10-CM

## 2014-11-05 DIAGNOSIS — M150 Primary generalized (osteo)arthritis: Secondary | ICD-10-CM

## 2014-11-05 DIAGNOSIS — Z0000 Encounter for general adult medical examination without abnormal findings: Secondary | ICD-10-CM

## 2014-11-05 LAB — BASIC METABOLIC PANEL WITH GFR
BUN: 13 mg/dL (ref 6–23)
CALCIUM: 10.4 mg/dL (ref 8.4–10.5)
CO2: 30 mEq/L (ref 19–32)
Chloride: 97 mEq/L (ref 96–112)
Creat: 0.68 mg/dL (ref 0.50–1.10)
GFR, Est Non African American: 86 mL/min
GLUCOSE: 107 mg/dL — AB (ref 70–99)
POTASSIUM: 3.5 meq/L (ref 3.5–5.3)
SODIUM: 138 meq/L (ref 135–145)

## 2014-11-05 LAB — GLUCOSE, CAPILLARY: GLUCOSE-CAPILLARY: 107 mg/dL — AB (ref 70–99)

## 2014-11-05 LAB — POCT GLYCOSYLATED HEMOGLOBIN (HGB A1C): Hemoglobin A1C: 5.9

## 2014-11-05 MED ORDER — POTASSIUM CHLORIDE ER 20 MEQ PO TBCR: 40.0000 meq | EXTENDED_RELEASE_TABLET | Freq: Every day | ORAL | Status: DC

## 2014-11-05 NOTE — Assessment & Plan Note (Signed)
She states that her arthritis pain has been very manageable lately and has not required any as needed Naprosyn. She has the Naprosyn available should she develop an exacerbation of her arthritic complaints. Otherwise, we will reassess her arthritic symptoms at the follow-up visit.

## 2014-11-05 NOTE — Progress Notes (Signed)
   Subjective:    Patient ID: Amanda Roman, female    DOB: Jun 04, 1941, 74 y.o.   MRN: [id number]  HPI  Amanda Roman is here for follow-up of her hypertension. Please see the A&P for the status of the pt's chronic medical problems.  Review of Systems  Constitutional: Negative for activity change, appetite change and unexpected weight change.  Respiratory: Negative for chest tightness, shortness of breath and wheezing.   Cardiovascular: Negative for chest pain, palpitations and leg swelling.  Gastrointestinal: Negative for nausea, vomiting, abdominal pain, diarrhea and constipation.  Musculoskeletal: Negative for myalgias, back pain, joint swelling, arthralgias and gait problem.  Neurological: Negative for dizziness, weakness and light-headedness.  Psychiatric/Behavioral: Negative for dysphoric mood and decreased concentration. The patient is not nervous/anxious.       Objective:   Physical Exam  Constitutional: She is oriented to person, place, and time. She appears well-developed and well-nourished. No distress.  HENT:  Head: Normocephalic and atraumatic.  Eyes: Conjunctivae are normal. Right eye exhibits no discharge. Left eye exhibits no discharge. No scleral icterus.  Cardiovascular: Normal rate, regular rhythm and normal heart sounds.  Exam reveals no gallop and no friction rub.   No murmur heard. Pulmonary/Chest: Effort normal and breath sounds normal. No respiratory distress. She has no wheezes. She has no rales.  Abdominal: Soft. Bowel sounds are normal. She exhibits no distension. There is no tenderness. There is no rebound and no guarding.  Musculoskeletal: Normal range of motion. She exhibits no edema or tenderness.  Neurological: She is alert and oriented to person, place, and time. She exhibits normal muscle tone.  Skin: Skin is warm and dry. She is not diaphoretic.  Psychiatric: She has a normal mood and affect. Her behavior is normal. Judgment and thought content  normal.  Nursing note and vitals reviewed.     Assessment & Plan:   Please see problem oriented charting.

## 2014-11-05 NOTE — Patient Instructions (Signed)
It was great to see you again.  You are doing a wonderful job with your weight.  1) Keep taking your medications as you are.  2) Keep active.  Watch your portion size when you eat.  3) We checked some blood work.  I will call you next week when I get the results if there is anything we need to worry about.  I will see you in 3 months, sooner if necessary.

## 2014-11-05 NOTE — Assessment & Plan Note (Signed)
She states she recently received the Zostavax at her [facility]. We will try to track down the records to confirm this. We also assessed for diabetes given her morbid obesity and found her hemoglobin A1c to be 5.9. Because of her history of hypertension we obtained a basic metabolic panel to assess her kidney function and the results are pending at the time of this dictation. She is otherwise up-to-date on her health care maintenance.

## 2014-11-05 NOTE — Assessment & Plan Note (Signed)
Her blood pressure today was 138/88 which is under her target blood pressure. This is on amlodipine 10 mg by mouth daily, Coreg 25 mg by mouth twice daily, hydrochlorothiazide 25 mg by mouth every morning, and clonidine 0.3 mg by mouth twice daily. Given that her blood pressure is at target we will continue these for antihypertensive medications at their current doses. We will reassess blood pressure control at the follow-up visit.

## 2014-11-05 NOTE — Assessment & Plan Note (Signed)
She has a history of osteopenia. She is due for a DEXA scan but again would like to think about it given the possible out-of-pocket expenses. We will reassess her interest in further evaluation of her osteopenia with a repeat DEXA scan at the follow-up visit. In the meantime, she will continue with the calcium and vitamin D supplementation.

## 2014-11-05 NOTE — Assessment & Plan Note (Signed)
She is due for a follow-up surveillance colonoscopy given her history of tubular adenomas. Her last colonoscopy was in 2011 and was without new polyps. She would like to think about the colonoscopy given the costs and her age. We will reassess her desire for further surveillance at the follow-up visit.

## 2014-11-05 NOTE — Assessment & Plan Note (Signed)
She continues to do a nice job with her diet and has lost another 4 pounds since the last visit. She's a little nervous to walk around her neighborhood because of the dangers individuals nearby but will be joining a gym in order to maintain her activity and continue with the progress she has made on weight loss. She was praised for this progress and motivation to continue to work on her weight. We will reassess her progress at the return visit.

## 2014-11-06 ENCOUNTER — Ambulatory Visit: Payer: PRIVATE HEALTH INSURANCE | Admitting: Internal Medicine

## 2014-11-08 NOTE — Progress Notes (Signed)
BMP unremarkable with normal potassium and eGFR.  Will continue current antihypertensives.

## 2015-03-09 ENCOUNTER — Other Ambulatory Visit: Payer: Self-pay | Admitting: *Deleted

## 2015-03-09 DIAGNOSIS — I10 Essential (primary) hypertension: Secondary | ICD-10-CM

## 2015-03-09 MED ORDER — AMLODIPINE BESYLATE 10 MG PO TABS: 10.0000 mg | ORAL_TABLET | Freq: Every day | ORAL | Status: DC

## 2015-03-09 MED ORDER — HYDROCHLOROTHIAZIDE 25 MG PO TABS: 25.0000 mg | ORAL_TABLET | Freq: Every day | ORAL | Status: DC

## 2015-04-02 ENCOUNTER — Ambulatory Visit (INDEPENDENT_AMBULATORY_CARE_PROVIDER_SITE_OTHER): Payer: Commercial Managed Care - HMO | Admitting: Internal Medicine

## 2015-04-02 ENCOUNTER — Encounter: Payer: Self-pay | Admitting: Internal Medicine

## 2015-04-02 VITALS — BP 131/66 | HR 65 | Temp 98.4°F | Wt 197.7 lb

## 2015-04-02 DIAGNOSIS — M159 Polyosteoarthritis, unspecified: Secondary | ICD-10-CM | POA: Diagnosis not present

## 2015-04-02 DIAGNOSIS — Z0000 Encounter for general adult medical examination without abnormal findings: Secondary | ICD-10-CM

## 2015-04-02 DIAGNOSIS — Z6841 Body Mass Index (BMI) 40.0 and over, adult: Secondary | ICD-10-CM

## 2015-04-02 DIAGNOSIS — I10 Essential (primary) hypertension: Secondary | ICD-10-CM | POA: Diagnosis not present

## 2015-04-02 DIAGNOSIS — M150 Primary generalized (osteo)arthritis: Secondary | ICD-10-CM

## 2015-04-02 DIAGNOSIS — Z86010 Personal history of colonic polyps: Secondary | ICD-10-CM

## 2015-04-02 DIAGNOSIS — M8580 Other specified disorders of bone density and structure, unspecified site: Secondary | ICD-10-CM

## 2015-04-02 DIAGNOSIS — D126 Benign neoplasm of colon, unspecified: Secondary | ICD-10-CM

## 2015-04-02 NOTE — Assessment & Plan Note (Signed)
Her arthritis is stable and she remains as active as possible using a cane. I filled out a renewal for her handicapped parking sticker which she qualifies for. She's not interested in any pharmacologic therapy at this time. We will reassess her arthritic pain at the follow-up visit, but are hopeful that if she can achieve weight loss she may receive some benefit with regards to her arthritic symptoms.

## 2015-04-02 NOTE — Assessment & Plan Note (Signed)
It is been 5 years since her last colonoscopy which failed to reveal any colonic polyps. She had a tubular adenoma removed from her cecum in 2006. Currently she would like to discuss the issue of colonoscopy with her sister who would have to take time off from work to take her to and from the procedure. At her age she is not sure she is interested in repeating the colonoscopy, but will think about it and let me know at the follow-up visit.

## 2015-04-02 NOTE — Assessment & Plan Note (Signed)
Her blood pressure today was 131/66 which is well within target. This is while taking amlodipine 10 mg by mouth daily, carvedilol 25 mg by mouth twice daily, hydrochlorothiazide 25 mg by mouth each morning, and clonidine 0.3 mg by mouth twice daily. She is tolerating this regimen well we will continue at the current doses.

## 2015-04-02 NOTE — Patient Instructions (Signed)
It was great to see you again.  You are doing a wonderful job with your health.  1) Keep taking your medications as you are.  2) We will get a DEXA scan to make sure your bones are not weakening any further.  3) Talk to your sister about whether or not she can take you for a colonoscopy.  I will see you back in 6 months, sooner if necessary.

## 2015-04-02 NOTE — Assessment & Plan Note (Signed)
She has already received a flu vaccination at her local pharmacy. Other than the colonoscopy which is due to as noted above she is up-to-date on her preventative health care maintenance.

## 2015-04-02 NOTE — Assessment & Plan Note (Signed)
She continues to struggle with her weight and it is up 5 pounds from the last visit. This is likely contributing to her arthritis. She was encouraged and motivated to continue to work on remaining as active as possible despite her arthritis and to the more cognizant of what she ate and her portion sizes. We will reassess her weight at the follow-up visit.

## 2015-04-02 NOTE — Assessment & Plan Note (Signed)
She has a history of osteopenia and has been taking calcium and vitamin D. She is due for a DEXA scan. We looked into this and the clinic does not feel she will have a co-pay. This order was therefore placed. We will reassess her therapy for her osteopenia after the results of the DEXA scan are available.
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
## 2015-04-19 ENCOUNTER — Other Ambulatory Visit: Payer: Self-pay | Admitting: Internal Medicine

## 2015-04-19 DIAGNOSIS — I10 Essential (primary) hypertension: Secondary | ICD-10-CM

## 2015-04-19 MED ORDER — CARVEDILOL 25 MG PO TABS: 25.0000 mg | ORAL_TABLET | Freq: Two times a day (BID) | ORAL | Status: DC

## 2015-04-19 NOTE — Telephone Encounter (Signed)
Pt called requesting carvedilol to be filled @ [facility] on [address].

## 2015-04-27 ENCOUNTER — Other Ambulatory Visit: Payer: Self-pay | Admitting: Internal Medicine

## 2015-04-27 ENCOUNTER — Other Ambulatory Visit: Payer: Self-pay | Admitting: *Deleted

## 2015-04-27 DIAGNOSIS — I10 Essential (primary) hypertension: Secondary | ICD-10-CM

## 2015-04-27 MED ORDER — CLONIDINE HCL 0.3 MG PO TABS: 0.3000 mg | ORAL_TABLET | Freq: Two times a day (BID) | ORAL | Status: DC

## 2015-04-27 NOTE — Telephone Encounter (Signed)
Pt requesting clonidine to be filled @ [facility].

## 2015-04-28 NOTE — Telephone Encounter (Signed)
done

## 2015-07-23 ENCOUNTER — Other Ambulatory Visit: Payer: Self-pay | Admitting: *Deleted

## 2015-07-23 DIAGNOSIS — E876 Hypokalemia: Secondary | ICD-10-CM

## 2015-07-23 DIAGNOSIS — I10 Essential (primary) hypertension: Secondary | ICD-10-CM

## 2015-07-23 MED ORDER — CARVEDILOL 25 MG PO TABS: 25.0000 mg | ORAL_TABLET | Freq: Two times a day (BID) | ORAL | Status: DC

## 2015-07-23 MED ORDER — HYDROCHLOROTHIAZIDE 25 MG PO TABS: 25.0000 mg | ORAL_TABLET | Freq: Every day | ORAL | Status: DC

## 2015-07-23 MED ORDER — POTASSIUM CHLORIDE ER 20 MEQ PO TBCR: 40.0000 meq | EXTENDED_RELEASE_TABLET | Freq: Every day | ORAL | Status: DC

## 2015-07-23 MED ORDER — CLONIDINE HCL 0.3 MG PO TABS: 0.3000 mg | ORAL_TABLET | Freq: Two times a day (BID) | ORAL | Status: DC

## 2015-07-23 MED ORDER — AMLODIPINE BESYLATE 10 MG PO TABS: 10.0000 mg | ORAL_TABLET | Freq: Every day | ORAL | Status: DC

## 2015-07-23 NOTE — Telephone Encounter (Signed)
Pt called - stated she's changing to [facility] [place].  Last appt 04/02/15.

## 2015-09-24 ENCOUNTER — Encounter: Payer: Self-pay | Admitting: Internal Medicine

## 2015-09-24 ENCOUNTER — Ambulatory Visit (INDEPENDENT_AMBULATORY_CARE_PROVIDER_SITE_OTHER): Payer: Commercial Managed Care - HMO | Admitting: Internal Medicine

## 2015-09-24 ENCOUNTER — Encounter: Payer: Commercial Managed Care - HMO | Admitting: Internal Medicine

## 2015-09-24 VITALS — BP 135/64 | HR 63 | Temp 98.0°F | Ht <= 58 in | Wt 190.8 lb

## 2015-09-24 DIAGNOSIS — Z0000 Encounter for general adult medical examination without abnormal findings: Secondary | ICD-10-CM | POA: Diagnosis not present

## 2015-09-24 NOTE — Progress Notes (Deleted)
Patient ID: Amanda Roman, female   DOB: [birth year], 75 y.o.   MRN: [id number]     Subjective:   Amanda Roman is a 75 y.o. female who presents for a Medicare Annual Wellness Visit.  The following items have been reviewed and updated today in the appropriate area in the EMR.   Health Risk Assessment  Height, weight, BMI, and BP Depression screen Fall risk / safety level TUG 11.5 seconds w/o cane Advance directive discussion Medical and family history were reviewed and updated Updating list of other providers & suppliers Medication reconciliation, including over the counter medicines Cognitive screen Written screening schedule Risk Factor list Personalized health advice, risky behaviors, and treatment advice       Objective:    Vitals: There were no vitals taken for this visit.  Activities of Daily Living In your present state of health, do you have any difficulty performing the following activities: 04/02/2015  Hearing? N  Vision? N  Difficulty concentrating or making decisions? N  Walking or climbing stairs? Y  Dressing or bathing? N  Doing errands, shopping? N    Goals Goals    . Blood Pressure < 140/90    . Weight < 180 lb (81.647 kg)       Fall Risk Fall Risk  04/02/2015 02/06/2014 11/07/2013 11/08/2012 05/02/2012  Falls in the past year? - No No No -  Risk for fall due to : Impaired balance/gait - - - Impaired balance/gait  Risk for fall due to (comments): - - - - uses a cane    Depression Screen PHQ 2/9 Scores 04/02/2015 02/06/2014 11/07/2013 11/08/2012  PHQ - 2 Score 0 0 0 0     Cognitive Testing I assessed the patient for cognitive issues and the patient {Desc; did/not:[id number]} have issues with his / her cognition.  Assessment and Plan:    During the course of the visit the patient was educated and counseled about appropriate screening and preventive services as documented in the assessment and plan.  The printed AVS was given to the patient and included an  updated screening schedule, a list of risk factors, and personalized health advice.    No problem-specific assessment & plan notes found for this encounter.     Karren Cobble, MD  09/24/2015

## 2015-09-24 NOTE — Addendum Note (Signed)
Addended by: Oval Linsey D on: 09/24/2015 05:44 PM   Modules accepted: Orders

## 2015-09-24 NOTE — Progress Notes (Signed)
   Subjective:   Amanda Roman is a 75 y.o. female who presents for a Medicare Annual Wellness Visit.  The following items have been reviewed and updated today in the appropriate area in the EMR.   Health Risk Assessment  Height, weight, BMI, and BP Depression screen Fall risk / safety level TUG 11.5 seconds w/o cane Medical and family history were reviewed and updated Updating list of other providers & suppliers Medication reconciliation, including over the counter medicines Cognitive screen Written screening schedule Risk Factor list Personalized health advice, risky behaviors, and treatment advice       Objective:    Vitals: BP 135/64 mmHg  Pulse 63  Temp(Src) 98 F (36.7 C) (Oral)  Ht 4\' 8"  (1.422 m)  Wt 190 lb 12.8 oz (86.546 kg)  BMI 42.80 kg/m2  SpO2 100%  Activities of Daily Living In your present state of health, do you have any difficulty performing the following activities: 04/02/2015  Hearing? N  Vision? N  Difficulty concentrating or making decisions? N  Walking or climbing stairs? Y  Dressing or bathing? N  Doing errands, shopping? N    Goals Goals    . Blood Pressure < 140/90    . Weight < 180 lb (81.647 kg)       Fall Risk Fall Risk  09/24/2015 04/02/2015 02/06/2014 11/07/2013 11/08/2012  Falls in the past year? No - No No No  Risk for fall due to : Impaired balance/gait Impaired balance/gait - - -  Risk for fall due to (comments): - - - - -    Depression Screen PHQ 2/9 Scores 09/24/2015 04/02/2015 02/06/2014 11/07/2013  PHQ - 2 Score 0 0 0 0     Cognitive Testing I assessed the patient for cognitive issues and the patient did  have issues with her cognition. On the mini-COG she scored 2.  She will require more specific cognitive testing.  Assessment and Plan:    During the course of the visit the patient was educated and counseled about appropriate screening and preventive services as documented in the assessment and plan.  The printed AVS was  given to the patient and included an updated screening schedule, a list of risk factors, and personalized health advice.    No problem-specific assessment & plan notes found for this encounter.  Karren Cobble, MD  09/24/2015

## 2015-09-24 NOTE — Patient Instructions (Addendum)
Annual Wellness Visit   Medicare Covered Preventative Screenings and Services  Services & Screenings Men and Women Who How Often Need? Date of Last Service Action  Abdominal Aortic Aneurysm Adults with AAA risk factors Once     Alcohol Misuse and Counseling All Adults Screening once a year if no alcohol misuse. Counseling up to 4 face to face sessions. Yes Today No alcohol misuse  Bone Density Measurement  Adults at risk for osteoporosis Once every 2 yrs Yes Today Scheduled this Monday  Lipid Panel Z13.6 All adults without CV disease Once every 5 yrs     Colorectal Cancer   Stool sample or  Colonoscopy All adults 76 and older   Once every year  Every 10 years     Depression All Adults Once a year  Today Negative screen  Diabetes Screening Blood glucose, post glucose load, or GTT Z13.1  All adults at risk  Pre-diabetics  Once per year  Twice per year     Diabetes  Self-Management Training All adults Diabetics 10 hrs first year; 2 hours subsequent years. Requires Copay     Glaucoma  Diabetics  Family history of glaucoma  African Americans 34 yrs +  Hispanic Americans 23 yrs + Annually - requires coppay     Hepatitis C Z72.89 or F19.20  High Risk for HCV  Born between 1945 and 1965  Annually  Once     HIV Z11.4 All adults based on risk  Annually btw ages 12 & 60 regardless of risk  Annually > 65 yrs if at increased risk     Lung Cancer Screening Asymptomatic adults aged 50-77 with 30 pack yr history and current smoker OR quit within the last 15 yrs Annually Must have counseling and shared decision making documentation before first screen     Medical Nutrition Therapy Adults with   Diabetes  Renal disease  Kidney transplant within past 3 yrs 3 hours first year; 2 hours subsequent years Yes Today Consult placed  Obesity and Counseling All adults Screening once a year Counseling if BMI 30 or higher Yes Today Counseling given.  Referral to Nutritionist.  Tobacco  Use Counseling Adults who use tobacco  Up to 8 visits in one year     Vaccines Z23  Hepatitis B  Influenza   Pneumonia  Adults   Once  Once every flu season  Two different vaccines separated by one year     Next Annual Wellness Visit People with Medicare Every year       Services & Screenings Women Who How Often Need  Date of Last Service Action  Mammogram  Z12.31 Women over 18 One baseline ages 30-39. Annually ager 57 yrs+ Yes Today Scheduled for this Monday  Pap tests All women Annually if high risk. Every 2 yrs for normal risk women     Screening for cervical cancer with   Pap (Z01.419 nl or Z01.411abnl) &  HPV Z11.51 Women aged 48 to 76 Once every 5 yrs     Screening pelvic and breast exams All women Annually if high risk. Every 2 yrs for normal risk women     Sexually Transmitted Diseases  Chlamydia  Gonorrhea  Syphilis All at risk adults Annually for non pregnant females at increased risk         [place] Men Who How Ofter Need  Date of Last Service Action  Prostate Cancer - DRE & PSA Men over 50 Annually.  DRE might require a copay.  Sexually Transmitted Diseases  Syphilis All at risk adults Annually for men at increased risk         Things That May Be Affecting Your Health:  Alcohol  Hearing loss  Pain    Depression  Home Safety  Sexual Health   Diabetes X Lack of physical activity  Stress   Difficulty with daily activities  Loneliness  Tiredness   Drug use  Medicines  Tobacco use   Falls  Motor Vehicle Safety X Weight  X Food choices  Oral Health  Other    YOUR PERSONALIZED HEALTH PLAN : 1. Schedule your next subsequent Medicare Wellness visit in one year 2. Attend all of your regular appointments to address your medical issues 3. Complete the preventative screenings and services 4. I will be sending you to a Nutritionist to discuss diet and exercise (Silver Sneakers)

## 2015-09-27 ENCOUNTER — Ambulatory Visit
Admission: RE | Admit: 2015-09-27 | Discharge: 2015-09-27 | Disposition: A | Discharge status: Home / Self Care | Payer: Commercial Managed Care - HMO | Source: Ambulatory Visit | Attending: Internal Medicine | Admitting: Internal Medicine

## 2015-09-27 DIAGNOSIS — M8580 Other specified disorders of bone density and structure, unspecified site: Secondary | ICD-10-CM

## 2015-10-11 ENCOUNTER — Ambulatory Visit: Payer: Commercial Managed Care - HMO | Admitting: Dietician

## 2015-10-18 ENCOUNTER — Ambulatory Visit (INDEPENDENT_AMBULATORY_CARE_PROVIDER_SITE_OTHER): Payer: Commercial Managed Care - HMO | Admitting: Dietician

## 2015-10-18 VITALS — Ht <= 58 in | Wt 185.2 lb

## 2015-10-18 DIAGNOSIS — Z6841 Body Mass Index (BMI) 40.0 and over, adult: Secondary | ICD-10-CM

## 2015-10-18 DIAGNOSIS — Z713 Dietary counseling and surveillance: Secondary | ICD-10-CM

## 2015-10-18 NOTE — Patient Instructions (Addendum)
Have fun at silver sneakers!!!  Take your card with you and go to a [employer].   Please write down everything you eat and drink for 3 days and bring it back with you.  You need protein foods every day- beans, eggs, chicken, [place], fish. Milk, yogurt  Can cut calories by using less sugar, oil and margarine, salad dressing, smaller portions of starches like rice, potatoes, bread

## 2015-10-18 NOTE — Progress Notes (Signed)
  Medical Nutrition Therapy:  Appt start time: 0940 end time:  1030. Visit # 1  Assessment:  Primary concerns today: weight loss and silver sneakers sign up  Amanda Roman was referred for Silver sneakers and weight loss counseling. She would like to decrease her weight a bit more to help her move easier and make it easier on her joints. She has been changng her food choices and cooking methods to decrease her fat and sugar calories. She has lost ~ 12# in the past year. She is is interested in attending Silver sneakers. She has no difficulty with chewing swallowing or constipation. She eats out ~ 1x/week and tried to make healthy choices. She shops once a week and does her own cooking and food preparation. reads without difficulty and verbalized understanding of how to record her food intake. Her reported food intake and meal pattern is acceptable and adequate. Her blood pressure is well controlled.   Preferred Learning Style: No preference indicated  Learning Readiness: Contemplating  ANTHROPOMETRICS: , BMI-40- obese class II/III WEIGHT HISTORY:has been decreasing her weight by baking instead of frying, eating lite and low calorie foods, she is currently at her lowest weight since 2008 SLEEP: goes to bed 11pm-12 am,wakes ~ 5:30 AM, naps, 12-3pm daily MEDICATIONS: oscal, MVI and KCL daily Blood sugar: A1c- 5.9% in April 2016 decreased from 6.1% in 2013 DIETARY INTAKE: Usual eating pattern includes 3 meals and 1-2snacks per day. Everyday foods include .  Avoided foods include pork, fried foods, pancakes, french toast, sweets   24-hr recall:  B ( 8 AM): 1- 1 1/2 cups old fashioned oats, raisin, lite fruit, orange juice L ( 11PM): 1/2 to whole sandwich [place], tuna or chicken salad with lite salad dressing, sometimes fruit sometimes a salad Snk (3 PM): fruit, green teas or low fat milk D ( 5 PM): vegetables, potatoes, sometimes meat, K & W 1x/week- steak cabbage, greens brown rice.  Snk ( PM): milk,  yogurt, gelatin, hot chocolate Beverages: water, milk, orange juice  Usual physical activity: walks around her complex, afraid of people not nice to seniors in her neighborhood so does not go further   Estimated calories needs for weight loss fo 1#/week: 1400-1500 calories/day   Progress Towards Goal(s):  Some progress.   Nutritional Diagnosis:  NB-2.1 Physical inactivity As related to lack of adequate access to safe places to exercise.  As evidenced by her report. NI-1.6 Predicted suboptional energy  As related to her cutting back of portions and using lite and lowfat foods and cooking methods.  As evidenced by her weight loss and report.    Intervention:  Nutrition education about maintaining protein intake while cutting back of calories, excess energy intake to help her decrease her weight. Note goal weight is < 180#.  She was successfully signed up and informed about where she could go to participate in silver Sneakers today- she has two places to attend that are 1 and 2 miles from her house. Her Silver sneakers card was printed and given to her.   Teaching Method Utilized: Visual ,  Auditory Handouts given during visit include: 3 day food record,  Barriers to learning/adherence to lifestyle change: lack of transportation and material resources Demonstrated degree of understanding via:  Teach Back   Monitoring/Evaluation:  Dietary intake, exercise, and body weight in 4 week(s).

## 2015-11-15 ENCOUNTER — Encounter: Payer: Self-pay | Admitting: Dietician

## 2015-11-15 ENCOUNTER — Ambulatory Visit (INDEPENDENT_AMBULATORY_CARE_PROVIDER_SITE_OTHER): Payer: Commercial Managed Care - HMO | Admitting: Dietician

## 2015-11-15 VITALS — Wt 183.6 lb

## 2015-11-15 DIAGNOSIS — Z6839 Body mass index (BMI) 39.0-39.9, adult: Secondary | ICD-10-CM

## 2015-11-15 DIAGNOSIS — E669 Obesity, unspecified: Secondary | ICD-10-CM

## 2015-11-15 DIAGNOSIS — Z6841 Body Mass Index (BMI) 40.0 and over, adult: Secondary | ICD-10-CM

## 2015-11-15 DIAGNOSIS — Z713 Dietary counseling and surveillance: Secondary | ICD-10-CM | POA: Diagnosis not present

## 2015-11-15 NOTE — Progress Notes (Signed)
  Medical Nutrition Therapy:  Appt start time: 0940 end time:  1030. Visit # 2  Assessment:  Primary concerns today: weight loss and healthy eating  Ms. Speckman attended [employer] 3x/week for the past 4 weeks. She pays 10$ for transportation every time she goes. She has noticed more energy and better sleep since starting. She kept 3 days fo food records that showed good food intake and meal pattern. Her blood pressure is well controlled.   Learning Readiness: Ready  ANTHROPOMETRICS:lost ~ 0.4#/week , BMI- 39!! Now obese class II! , 165# would put her at a BMI of 35 SLEEP: reports better sleep since starting exercise MEDICATIONS: same  DIETARY INTAKE:  24-hr recall:  B ( 8 AM): 1- 1 1/2 cups old fashioned oats, raisin, lite fruit, orange juice L ( 11PM): 1/2 to whole sandwich [place], tuna or chicken salad with lite salad dressing, sometimes fruit sometimes a salad Snk (3 PM): fruit, green teas or low fat milk D ( 5 PM): vegetables, potatoes, sometimes meat, K & W 1x/week- chicken pot pie, cabbage, green beans, broccoli.  Snk ( PM): gelatin, granola bar, peaches Beverages: water, milk, orange juice, coffee, unsweet tea, sugar free lemonade  Usual physical activity: walks around her complex, afraid of people not nice to seniors in her neighborhood so does not go further   Estimated calories needs for weight loss fo 1#/week: 1400-1500 calories/day   Progress Towards Goal(s):  Some progress.   Nutritional Diagnosis:  NB-2.1 Physical inactivity As related to lack of adequate access to safe places to exercise is improving  As evidenced by her report of going to the [facility] 3x/week. NI-1.6 Predicted suboptional energy  As related to her cutting back of portions and using lite and lowfat foods and cooking methods continues to improve  As evidenced by her weight loss and report.    Intervention:  Nutrition education about maintaining healthy eating and exercise on vacation. Reviewed food  records, answered her questions about healthy eating and assisted her with completing her part of the scat application.  Note goal weight is < 180#.   Teaching Method Utilized: Visual ,  Auditory, visual Handouts given during visit include: AVS  Barriers to learning/adherence to lifestyle change: lack of transportation and material resources Demonstrated degree of understanding via:  Teach Back   Monitoring/Evaluation:  Dietary intake, exercise, and body weight in 8 week(s).

## 2015-11-15 NOTE — Patient Instructions (Signed)
Thank you for doing the food records! You d di a wonderful job completing them.   You are doing great going to the [facility] and eating healthy!  More tips for healthier eating: regular 5 minutes grits, Smart Pop popcorn, please chek your [place] margarine for trans fats- they are bad for [place]. Tubs do not have any trans fats.   Ways to continue your exercise on vacation:  Walk around the bus a few times before you go in to the bathroom.   Amanda Roman is our Education officer, museum and may call you about the SCAT application.

## 2015-11-19 ENCOUNTER — Telehealth: Payer: Self-pay | Admitting: Licensed Clinical Social Worker

## 2015-11-19 ENCOUNTER — Encounter: Payer: Self-pay | Admitting: Licensed Clinical Social Worker

## 2015-11-19 NOTE — Telephone Encounter (Signed)
CSW received Part A of Ms. Curl's SCAT application.  CSW placed call to pt to notify Ms. Hibben of the need to have Part B, page 1 Release signed.  Pt agreeable to have CSW mail form for signature with envelope for return.  Awaiting pt's signed ROI Part B for completion of SCAT application.

## 2015-11-24 NOTE — Telephone Encounter (Signed)
Amanda Roman says she has not gotten the form yet- just got back from exercise. Spoke with Edwena Blow our Education officer, museum, who advised she wait until she gets there form, sign it then mail it back. Informed patient of this, she verbalized understanding.

## 2015-11-26 ENCOUNTER — Encounter: Payer: Commercial Managed Care - HMO | Admitting: Internal Medicine

## 2015-11-30 ENCOUNTER — Encounter: Payer: Self-pay | Admitting: Licensed Clinical Social Worker

## 2015-11-30 NOTE — Progress Notes (Signed)
Patient ID: Amanda Roman, female   DOB: [birth year], 75 y.o.   MRN: [id number] Pt completed SCAT application, Part A and Part B faxed to SCAT Eligibility today.

## 2015-12-28 ENCOUNTER — Encounter: Payer: Self-pay | Admitting: *Deleted

## 2016-01-13 ENCOUNTER — Ambulatory Visit: Payer: Commercial Managed Care - HMO | Admitting: Dietician

## 2016-01-19 ENCOUNTER — Ambulatory Visit (INDEPENDENT_AMBULATORY_CARE_PROVIDER_SITE_OTHER): Payer: Commercial Managed Care - HMO | Admitting: Dietician

## 2016-01-19 ENCOUNTER — Encounter: Payer: Self-pay | Admitting: Dietician

## 2016-01-19 VITALS — Wt 181.7 lb

## 2016-01-19 DIAGNOSIS — Z6839 Body mass index (BMI) 39.0-39.9, adult: Secondary | ICD-10-CM

## 2016-01-19 DIAGNOSIS — E669 Obesity, unspecified: Secondary | ICD-10-CM

## 2016-01-19 DIAGNOSIS — Z713 Dietary counseling and surveillance: Secondary | ICD-10-CM

## 2016-01-19 DIAGNOSIS — Z6841 Body Mass Index (BMI) 40.0 and over, adult: Secondary | ICD-10-CM

## 2016-01-19 NOTE — Progress Notes (Signed)
  Medical Nutrition Therapy:  Appt start time: 0915 end time:  0950. Visit # 3  Assessment:  Primary concerns today: weight loss and healthy eating  Amanda Roman attended [employer] 2x/week for the past 4 weeks. She reports making healthy choices as much as possible when on her cruise and lot's of walking. We discussed her desired weight goal of 130# vs a higher weight that she may feel healthy. Also discussed recommendation for activity at least 3 days per week not skipping more than 2 days in a row. She got the 5 minute grits and checked her margarine for trans fats. She is happy and can afford the scat transportation.  Learning Readiness: Ready  ANTHROPOMETRICS:lost ~ 2.0# total in 4 weeks/~0.5#/week , BMI- 39.3 obese class II, 165# would put her at a BMI of 35, has lost ~ 19# in past year SLEEP: reports better sleep since starting exercise MEDICATIONS: same which includes a multivitamin, medicines for blood pressure Blood pressure: much improved with weight loss and lifestyle change, most recent was 135/64  DIETARY INTAKE:  24-hr recall:  B ( 8 AM):  old fashioned oats, raisins, lite fruit, water or orange juice or honey oats/raisin bran and milk L ( 11PM): sometimes oatmeal if she is very hyngry, 1/2 to whole sandwich [place], tuna or chicken salad with lite salad dressing, sometimes fruit sometimes a salad Snk (3 PM): fruit D ( 5 PM): vegetables, potatoes, sometimes meat  Snk ( PM): sugar free gelatin, granola bar, peaches Beverages: water, milk, orange juice, coffee- not while hot out, unsweet tea, sugar free lemonade  Usual physical activity: walks around her complex, gardens, walks to see a friend   Estimated calories needs for weight loss of 1#/week: 1400-1500 calories/day   Progress Towards Goal(s):  Some progress.   Nutritional Diagnosis:  NB-2.1 Physical inactivity As related to lack of adequate access to safe places to exercise is improving, but still needs reinforcement  and support  As evidenced by her report of going to the [facility] 2x/week. NI-1.6 Predicted suboptional energy  As related to her cutting back of portions and using lite and lowfat foods and cooking methods continues to improve  As evidenced by her gradual weight loss and report.    Intervention:  Nutrition education about maintaining healthy eating and exercise.  Current goal weight is < 180#. Eventual goal weight ~ 160#-170#  Teaching Method Utilized: Visual ,  Auditory, visual Handouts given during visit include: AVS  Barriers to learning/adherence to lifestyle change: difficulty with transportation and material resources Demonstrated degree of understanding via:  Teach Back   Monitoring/Evaluation:  Dietary intake, exercise, and body weight in 8 week(s).

## 2016-01-19 NOTE — Patient Instructions (Signed)
Doing great! Lost 2 more pounds- almost to your goal of Less than 180#  Keep eating more veggies and fruits, low fat meat and meatless meals.   Best to exercise every other day  Such as Monday, Wednesday and Friday so as not to have more than 2 days without exercise in a row.   Bring your food record with you in September.  See you in September!

## 2016-03-03 ENCOUNTER — Ambulatory Visit (INDEPENDENT_AMBULATORY_CARE_PROVIDER_SITE_OTHER): Payer: Commercial Managed Care - HMO | Admitting: Internal Medicine

## 2016-03-03 ENCOUNTER — Encounter: Payer: Self-pay | Admitting: Internal Medicine

## 2016-03-03 VITALS — BP 138/67 | HR 61 | Temp 98.1°F | Wt 181.1 lb

## 2016-03-03 DIAGNOSIS — I10 Essential (primary) hypertension: Secondary | ICD-10-CM

## 2016-03-03 DIAGNOSIS — Z0000 Encounter for general adult medical examination without abnormal findings: Secondary | ICD-10-CM

## 2016-03-03 DIAGNOSIS — D126 Benign neoplasm of colon, unspecified: Secondary | ICD-10-CM | POA: Diagnosis not present

## 2016-03-03 DIAGNOSIS — I872 Venous insufficiency (chronic) (peripheral): Secondary | ICD-10-CM

## 2016-03-03 DIAGNOSIS — M150 Primary generalized (osteo)arthritis: Secondary | ICD-10-CM | POA: Diagnosis not present

## 2016-03-03 DIAGNOSIS — E669 Obesity, unspecified: Secondary | ICD-10-CM

## 2016-03-03 DIAGNOSIS — M159 Polyosteoarthritis, unspecified: Secondary | ICD-10-CM

## 2016-03-03 DIAGNOSIS — Z23 Encounter for immunization: Secondary | ICD-10-CM

## 2016-03-03 DIAGNOSIS — Z6839 Body mass index (BMI) 39.0-39.9, adult: Secondary | ICD-10-CM

## 2016-03-03 DIAGNOSIS — R040 Epistaxis: Secondary | ICD-10-CM

## 2016-03-03 NOTE — Patient Instructions (Signed)
It was great to see you again.  You are doing a fantastic job with your health and I am so proud of your weight loss!  1) Keep taking your medications as you are.  2) I will call you next week when I get the results of your blood tests.  3) We gave you the flu shot today.  4) Keep exercising with Silver Sneakers as you have been.  This should keep you healthy!  I will see you back in 6 months, sooner if necessary.
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
## 2016-03-03 NOTE — Assessment & Plan Note (Signed)
Assessment  She continues to have arthritic pain particularly in the knee but this is improving with exercise.  Plan  We have continued to encourage exercise and diet for weight loss. As she has achieved some improvement in her knee pain with this intervention and we are encouraging her to continue pursuing this line of therapy as we believe it will have multiple benefits. We will reassess her arthritic pain control at the follow-up visit.

## 2016-03-03 NOTE — Assessment & Plan Note (Signed)
She received the flu vaccination today. She is otherwise up-to-date on her health care maintenance. 

## 2016-03-03 NOTE — Assessment & Plan Note (Signed)
Assessment  She states she had a surveillance colonoscopy by Dr. Benson Norway in January of this year. We do not have access to these records. She states she was told the colonoscopy looked good.  Plan  We will call Dr. Ulyses Amor office and try to obtain the notes from the colonoscopy in January for our records.

## 2016-03-03 NOTE — Assessment & Plan Note (Signed)
Assessment  She's had 2 episodes of epistaxis that were self-limited. The cause of this symptom is unclear although may be related to sinus and nasal mucosal congestion from allergies.  Plan  We will follow-up on the results of the CBC to make sure she does not have mucosal bleeding relating to thrombocytopenia. Once the symptoms improve we may consider switching the antihistamine therapy to a nasal steroid. This will be addressed at the follow-up visit if there is no further epistaxis.

## 2016-03-03 NOTE — Assessment & Plan Note (Signed)
Assessment  She is been attending [employer] and has achieved some weight loss although her weight is exactly the same as it was one month ago at 181 pounds. With this exercise, which she is excited about, she feels better about herself and is sleeping better. She states her knee arthritis pain is improved with the weight loss. She has been able to work out the transportation and [employer] bus has been very helpful for her. She is able to afford the $14 for 10 trips.  Plan  She was encouraged to continue exercising with [employer]. She was also encouraged to continue to watch her diet in hopes of losing some more weight which should help with her arthritic pain.

## 2016-03-03 NOTE — Assessment & Plan Note (Signed)
Assessment  Her blood pressure is well controlled today at 138/67. This is on amlodipine 10 mg by mouth daily, carvedilol 25 mg by mouth twice daily, hydrochlorothiazide 25 mg by mouth daily, and clonidine 0.3 mg by mouth twice daily. She is tolerating this regimen well.  Plan  We will continue with the current antihypertensive regimen. A basic metabolic panel was obtained today and is pending at the time of this dictation. It will be followed up once resulted.
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
## 2016-03-04 LAB — BMP8+ANION GAP
ANION GAP: 18 mmol/L (ref 10.0–18.0)
BUN / CREAT RATIO: 13 (ref 12–28)
BUN: 8 mg/dL (ref 8–27)
CO2: 27 mmol/L (ref 18–29)
Calcium: 10.8 mg/dL — ABNORMAL HIGH (ref 8.7–10.3)
Chloride: 97 mmol/L (ref 96–106)
Creatinine, Ser: 0.61 mg/dL (ref 0.57–1.00)
GFR calc Af Amer: 101 mL/min/{1.73_m2} (ref >59)
GFR, EST NON AFRICAN AMERICAN: 88 mL/min/{1.73_m2} (ref >59)
Glucose: 100 mg/dL — ABNORMAL HIGH (ref 65–99)
POTASSIUM: 3.6 mmol/L (ref 3.5–5.2)
Sodium: 142 mmol/L (ref 134–144)

## 2016-03-04 LAB — CBC WITH DIFFERENTIAL/PLATELET
Basophils Absolute: 0 10*3/uL (ref 0.0–0.2)
Basos: 1 %
EOS (ABSOLUTE): 0.2 10*3/uL (ref 0.0–0.4)
EOS: 4 %
HEMATOCRIT: 40.5 % (ref 34.0–46.6)
Hemoglobin: 13.5 g/dL (ref 11.1–15.9)
IMMATURE GRANULOCYTES: 0 %
Immature Grans (Abs): 0 10*3/uL (ref 0.0–0.1)
LYMPHS ABS: 1.8 10*3/uL (ref 0.7–3.1)
Lymphs: 43 %
MCH: 30.9 pg (ref 26.6–33.0)
MCHC: 33.3 g/dL (ref 31.5–35.7)
MCV: 93 fL (ref 79–97)
MONOS ABS: 0.3 10*3/uL (ref 0.1–0.9)
Monocytes: 7 %
NEUTROS ABS: 1.9 10*3/uL (ref 1.4–7.0)
NEUTROS PCT: 45 %
Platelets: 285 10*3/uL (ref 150–379)
RBC: 4.37 x10E6/uL (ref 3.77–5.28)
RDW: 13 % (ref 12.3–15.4)
WBC: 4.2 10*3/uL (ref 3.4–10.8)

## 2016-03-06 NOTE — Progress Notes (Signed)
Patient ID: Amanda Roman, female   DOB: 1941/03/25, 75 y.o.   MRN: [id number]  CBC: Hct 40.5, Plts 285  No thrombocytopenia to explain epistaxis.  Hgb normal so bleeding has not been clinically significant.  BMP: K 3.6, Ca 10.8, BUN 8, Cr 0.61  Normal potassium and renal function on the HCTZ.  Calcium slightly elevated, likely related to the HCTZ and calcium supplementation.  We will continue with these therapies given the minimal elevation in the calcium, and follow-up on a repeat calcium at some point in the future to assure stability.  I called Ms. Mazzoni with these results.

## 2016-03-14 ENCOUNTER — Ambulatory Visit (INDEPENDENT_AMBULATORY_CARE_PROVIDER_SITE_OTHER): Payer: Commercial Managed Care - HMO | Admitting: Dietician

## 2016-03-14 DIAGNOSIS — Z6835 Body mass index (BMI) 35.0-35.9, adult: Secondary | ICD-10-CM

## 2016-03-14 DIAGNOSIS — E669 Obesity, unspecified: Secondary | ICD-10-CM

## 2016-03-14 DIAGNOSIS — Z713 Dietary counseling and surveillance: Secondary | ICD-10-CM

## 2016-03-14 NOTE — Patient Instructions (Addendum)
To improve your nutrition    1. Limit juice to 4 ounces a day  2. Limit starchy foods like: rice, potatoes, corn, bread, crackers to 1 serving at each meal.  You can save money by asking your pharmacist to show you the store brand of Centrum Silver

## 2016-03-14 NOTE — Progress Notes (Signed)
  Medical Nutrition Therapy:  Appt start time: 0915 end time:  0955. Visit # 4  Assessment:  Primary concerns today: weight loss and healthy eating  Ms. Coronel continues to attend [employer] 2x-3x/week. She brought food records that show good variety, plenty of fruit and vegetables and dairy several times a week.  She is happy with her current plan, but thinks she would feel better with a bit more weight loss.   Says she does not use salt in cooking but uses onions, herbs and Mrs. Dash.   ANTHROPOMETRICS:no change over last 4 weeks , BMI- 39.3 obese class II, 165# would put her at a BMI of 35, has lost ~ 19# in past year SLEEP: 1130 Pm to 630 AM, naps one time a day about noon MEDICATIONS: Reviewed with her today  DIETARY INTAKE:  24-hr recall:  B ( 8 AM):  old fashioned oats, raisins, lite fruit, water or orange cranberry  juice or honey oats/raisin bran and milk L ( 11PM): sometimes oatmeal if she is very hyngry, 1/2 to whole sandwich [place], tuna or chicken salad with lite salad dressing, sometimes fruit sometimes a salad Snk (3 PM): fruit D ( 5 PM): vegetables, potatoes, rice, bread, sometimes meat - beef, chicken, fish at least two times a week Snk ( PM): sugar free gelatin, granola bar, peaches Beverages: water, milk, orange juice, lemonadecoffee- not while hot out, unsweet tea, sugar free lemonade  Usual physical activity:Silver sneakers 2-3x/week  Estimated calories needs for weight loss of 1#/week: 1400-1500 calories/day   Progress Towards Goal(s):  Some progress.   Nutritional Diagnosis:  NB-2.1 Physical inactivity As related to lack of adequate access to safe places to exercise is improving  As evidenced by her report of going to the [facility] 2x/week. NI-1.6 Predicted suboptional energy  As related to her cutting back of portions and using lite and lowfat foods and cooking methods has dissipated As evidenced by her stable weight.    Intervention:  Nutrition education about  maintaining healthy eating and exercise.  Current goal weight is < 180#. A healthy weight is 160-170#  Teaching Method Utilized: Visual ,  Auditory, visual Handouts given during visit include: AVS, glass with juice markings Barriers to learning/adherence to lifestyle change: difficulty with transportation and material resources Demonstrated degree of understanding via:  Teach Back   Monitoring/Evaluation:  Dietary intake, exercise, and body weight 5 weeks.

## 2016-04-13 ENCOUNTER — Ambulatory Visit (INDEPENDENT_AMBULATORY_CARE_PROVIDER_SITE_OTHER): Payer: Commercial Managed Care - HMO | Admitting: Dietician

## 2016-04-13 DIAGNOSIS — E669 Obesity, unspecified: Secondary | ICD-10-CM | POA: Diagnosis not present

## 2016-04-13 DIAGNOSIS — Z713 Dietary counseling and surveillance: Secondary | ICD-10-CM

## 2016-04-13 DIAGNOSIS — Z6839 Body mass index (BMI) 39.0-39.9, adult: Secondary | ICD-10-CM | POA: Diagnosis not present

## 2016-04-13 NOTE — Progress Notes (Signed)
  Medical Nutrition Therapy:  Appt start time: 0918 end time:  0945. Visit # 5  Assessment:  Primary concerns today: weight loss and healthy eating  Ms. Blaes continues to attend [employer] 2x-3x/week. She cut back on sugary drinks including juice and now reports drinking 2 cases of water a week.  ANTHROPOMETRICS: BMI- 39.1 she lost 1.2#, she says it is more without her clothes ( wearing two shrits today) she maintains obese class II. She wants to set another goal of 175#.   DIETARY INTAKE:  24-hr recall:  B ( 8 AM):  oatmeal, raisins, lite fruit, water or or honey oats/raisin bran and milk L ( 11PM):  Salad, fruit Snk (3 PM): fruit D ( 5 PM): pinto beans, vegetables, sometimes meat  Snk ( PM): sugar free gelatin, granola bar, peaches Beverages: water, milk, coffee, unsweet tea, sugar free lemonade  Usual physical activity:Silver sneakers 2-3x/week  Estimated calories needs for weight loss of 1#/week: 1400-1500 calories/day   Progress Towards Goal(s):  Some progress.   Nutritional Diagnosis:  NB-2.1 Physical inactivity As related to lack of adequate access to safe places to exercise is resolved As evidenced by her report of going to the [facility] 2x/week. NI-1.6 Predicted suboptional energy  As related to her cutting back of portions and using lite and lowfat foods and cooking methods has improved As evidenced by her weight loss.    Intervention:  Nutrition education about maintaining healthy eating and exercise.  New goal weight is < 175#. A healthy weight is 160-170#  Teaching Method Utilized: Visual ,  Auditory, visual Handouts given during visit include: AVS, glass with juice markings Barriers to learning/adherence to lifestyle change: difficulty with transportation and material resources Demonstrated degree of understanding via:  Teach Back   Monitoring/Evaluation:  Dietary intake, exercise, and body weight 12 weeks.

## 2016-04-13 NOTE — Patient Instructions (Addendum)
You are doing great at moving more and eating healthier!   Your weight is now 180.7# or less( at home) your goal. Try to maintain that over the holidays    Your new goal is 175#.   Keep weighing yourself- if it creeps up, move more and cut back on starchy,  sugary foods.   See you January 4th at 9:15 AM.

## 2016-05-24 ENCOUNTER — Telehealth: Payer: Self-pay | Admitting: Dietician

## 2016-05-30 NOTE — Telephone Encounter (Signed)
Returned her call and told her when it is scheduled.

## 2016-06-30 ENCOUNTER — Ambulatory Visit (INDEPENDENT_AMBULATORY_CARE_PROVIDER_SITE_OTHER): Payer: Commercial Managed Care - HMO | Admitting: Internal Medicine

## 2016-06-30 ENCOUNTER — Inpatient Hospital Stay (HOSPITAL_COMMUNITY): Payer: Commercial Managed Care - HMO

## 2016-06-30 ENCOUNTER — Observation Stay (HOSPITAL_COMMUNITY)
Admission: AD | Admit: 2016-06-30 | Discharge: 2016-07-01 | Disposition: A | Discharge status: Home / Self Care | Payer: Commercial Managed Care - HMO | Source: Ambulatory Visit | Attending: Internal Medicine | Admitting: Internal Medicine

## 2016-06-30 ENCOUNTER — Encounter (INDEPENDENT_AMBULATORY_CARE_PROVIDER_SITE_OTHER): Payer: Self-pay

## 2016-06-30 VITALS — BP 149/66 | HR 58 | Temp 98.0°F | Ht <= 58 in | Wt 177.3 lb

## 2016-06-30 DIAGNOSIS — M79641 Pain in right hand: Secondary | ICD-10-CM

## 2016-06-30 DIAGNOSIS — M659 Synovitis and tenosynovitis, unspecified: Secondary | ICD-10-CM | POA: Insufficient documentation

## 2016-06-30 DIAGNOSIS — Z7982 Long term (current) use of aspirin: Secondary | ICD-10-CM | POA: Diagnosis not present

## 2016-06-30 DIAGNOSIS — L03113 Cellulitis of right upper limb: Secondary | ICD-10-CM | POA: Diagnosis not present

## 2016-06-30 DIAGNOSIS — Z79899 Other long term (current) drug therapy: Secondary | ICD-10-CM | POA: Diagnosis not present

## 2016-06-30 DIAGNOSIS — L539 Erythematous condition, unspecified: Secondary | ICD-10-CM | POA: Diagnosis not present

## 2016-06-30 DIAGNOSIS — M7989 Other specified soft tissue disorders: Secondary | ICD-10-CM | POA: Diagnosis not present

## 2016-06-30 DIAGNOSIS — I10 Essential (primary) hypertension: Secondary | ICD-10-CM | POA: Diagnosis not present

## 2016-06-30 DIAGNOSIS — Z87891 Personal history of nicotine dependence: Secondary | ICD-10-CM | POA: Insufficient documentation

## 2016-06-30 DIAGNOSIS — I872 Venous insufficiency (chronic) (peripheral): Secondary | ICD-10-CM | POA: Diagnosis not present

## 2016-06-30 DIAGNOSIS — M159 Polyosteoarthritis, unspecified: Secondary | ICD-10-CM | POA: Diagnosis not present

## 2016-06-30 LAB — CBC
HEMATOCRIT: 38.2 % (ref 36.0–46.0)
HEMOGLOBIN: 13 g/dL (ref 12.0–15.0)
MCH: 30.8 pg (ref 26.0–34.0)
MCHC: 34 g/dL (ref 30.0–36.0)
MCV: 90.5 fL (ref 78.0–100.0)
Platelets: 245 10*3/uL (ref 150–400)
RBC: 4.22 MIL/uL (ref 3.87–5.11)
RDW: 12.9 % (ref 11.5–15.5)
WBC: 8 10*3/uL (ref 4.0–10.5)

## 2016-06-30 LAB — BASIC METABOLIC PANEL
Anion gap: 8 (ref 5–15)
BUN: 6 mg/dL (ref 6–20)
CHLORIDE: 100 mmol/L — AB (ref 101–111)
CO2: 30 mmol/L (ref 22–32)
CREATININE: 0.62 mg/dL (ref 0.44–1.00)
Calcium: 10 mg/dL (ref 8.9–10.3)
GFR calc Af Amer: >60 mL/min (ref >60)
GFR calc non Af Amer: >60 mL/min (ref >60)
GLUCOSE: 123 mg/dL — AB (ref 65–99)
POTASSIUM: 3.7 mmol/L (ref 3.5–5.1)
Sodium: 138 mmol/L (ref 135–145)

## 2016-06-30 MED ORDER — ACETAMINOPHEN 325 MG PO TABS: 650.0000 mg | ORAL_TABLET | Freq: Four times a day (QID) | ORAL | Status: DC | PRN

## 2016-06-30 MED ORDER — VANCOMYCIN HCL IN DEXTROSE 750-5 MG/150ML-% IV SOLN
750.0000 mg | Freq: Two times a day (BID) | INTRAVENOUS | Status: DC
  Administered 2016-06-30: 750 mg via INTRAVENOUS
  Filled 2016-06-30 (×2): qty 150

## 2016-06-30 MED ORDER — SODIUM CHLORIDE 0.9% FLUSH: 3.0000 mL | INTRAVENOUS | Status: DC | PRN

## 2016-06-30 MED ORDER — SODIUM CHLORIDE 0.9 % IV SOLN
INTRAVENOUS | Status: AC
  Administered 2016-06-30: via INTRAVENOUS

## 2016-06-30 MED ORDER — CLONIDINE HCL 0.2 MG PO TABS
0.3000 mg | ORAL_TABLET | Freq: Two times a day (BID) | ORAL | Status: DC
  Administered 2016-07-01: 0.3 mg via ORAL
  Filled 2016-06-30 (×2): qty 1

## 2016-06-30 MED ORDER — ASPIRIN 81 MG PO CHEW
81.0000 mg | CHEWABLE_TABLET | Freq: Every day | ORAL | Status: DC
  Administered 2016-07-01: 81 mg via ORAL
  Filled 2016-06-30: qty 1

## 2016-06-30 MED ORDER — ACETAMINOPHEN 650 MG RE SUPP: 650.0000 mg | Freq: Four times a day (QID) | RECTAL | Status: DC | PRN

## 2016-06-30 MED ORDER — POTASSIUM CHLORIDE ER 10 MEQ PO TBCR
40.0000 meq | EXTENDED_RELEASE_TABLET | Freq: Every day | ORAL | Status: DC
  Administered 2016-07-01: 09:00:00 40 meq via ORAL
  Filled 2016-06-30 (×2): qty 4

## 2016-06-30 MED ORDER — HYDROCHLOROTHIAZIDE 25 MG PO TABS
25.0000 mg | ORAL_TABLET | Freq: Every day | ORAL | Status: DC
  Administered 2016-07-01: 09:00:00 25 mg via ORAL
  Filled 2016-06-30: qty 1

## 2016-06-30 MED ORDER — SODIUM CHLORIDE 0.9 % IV SOLN: 250.0000 mL | INTRAVENOUS | Status: DC | PRN

## 2016-06-30 MED ORDER — CARVEDILOL 25 MG PO TABS
25.0000 mg | ORAL_TABLET | Freq: Two times a day (BID) | ORAL | Status: DC
  Administered 2016-07-01: 09:00:00 25 mg via ORAL
  Filled 2016-06-30 (×2): qty 1

## 2016-06-30 MED ORDER — SODIUM CHLORIDE 0.9% FLUSH
3.0000 mL | Freq: Two times a day (BID) | INTRAVENOUS | Status: DC
  Administered 2016-06-30: 3 mL via INTRAVENOUS

## 2016-06-30 MED ORDER — AMLODIPINE BESYLATE 10 MG PO TABS
10.0000 mg | ORAL_TABLET | Freq: Every day | ORAL | Status: DC
  Administered 2016-07-01: 09:00:00 10 mg via ORAL
  Filled 2016-06-30: qty 1
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
## 2016-06-30 NOTE — Assessment & Plan Note (Signed)
Patient presenting with right hand pain that started 2 nights ago. She noticed hand pain while she was washing her vegetables. Since then her right hand has become more erythematous, warm, swollen, and painful. She currently rates pain as 10/10 in severity. She has tried arthritis ointment without relief. She has never had anything like this happen before and it is different from her arthritis pain that she has had in her knee. She has never been dx with gout before. Denies fevers, NS, and chills. Denies recent gardening or skin breaks. On exam her right hand is very erythematous and warm to the touch. She has limited range of motion of her rt 3rd and 4th digits. Both her palmar and dorsal surface of her hand is swollen. U/S or right  hand reveals inflammation without any joint space fluid collections.   Assessment: Pt's right hand is concerning for flexor tenosynovitis.   Plan: admit to IMTS for IV abx and hand sx consult.

## 2016-06-30 NOTE — Progress Notes (Signed)
Internal Medicine Clinic Attending  I saw and evaluated the patient.  I personally confirmed the key portions of the history and exam documented by Dr. Hulen Luster and I reviewed pertinent patient test results.  The assessment, diagnosis, and plan were formulated together and I agree with the documentation in the resident's note.  Two days of progressive right hand pain and swelling. Patient is right handed. No recent cuts or fevers. On exam she has significant swelling of the right palmar and dorsal surfaces with limited movement of third and forth digit, along with pain to passive range of motion. There is errythema of the skin. This is consistent with infection and fluid collection, possibly flexor tenosynovitis. Plan for admission today to start empiric IV antibiotics, would suggest vancomycin, and surgical consultation.
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
## 2016-06-30 NOTE — Progress Notes (Signed)
Report called to Nurse on 5000.  Patient accompanied by friend.  Alert and oriented.  Transported via wheelchair to [address] bed 15.  Sander Nephew, RN 06/30/2016 4:40 PM

## 2016-06-30 NOTE — Progress Notes (Signed)
Pharmacy Antibiotic Note  Amanda Roman is a 75 y.o. female admitted on 06/30/2016 with tenosynovitis.  Pharmacy has been consulted for vancomycin dosing.  CrCl ~50  Vancomycin trough 15-20  Plan: - vancomycin 750 mg iv q12h - monitor renal function - check vancomycin trough when it's appropriate  Height: 4\' 8"  (142.2 cm) Weight: 175 lb 8 oz (79.6 kg) IBW/kg (Calculated) : 36.3  Temp (24hrs), Avg:98.1 F (36.7 C), Min:98 F (36.7 C), Max:98.2 F (36.8 C)   Recent Labs Lab 06/30/16 1845 06/30/16 2131  WBC  --  8.0  CREATININE 0.62  --     Estimated Creatinine Clearance: 49.8 mL/min (by C-G formula based on SCr of 0.62 mg/dL).    Allergies  Allergen Reactions  . Ace Inhibitors Other (See Comments)    angioedema  . Penicillins Itching    Has patient had a PCN reaction causing immediate rash, facial/tongue/throat swelling, SOB or lightheadedness with hypotension: Yes Has patient had a PCN reaction causing severe rash involving mucus membranes or skin necrosis: No Has patient had a PCN reaction that required hospitalization No Has patient had a PCN reaction occurring within the last 10 years: No If all of the above answers are "NO", then may proceed with Cephalosporin use.  . Acetaminophen-Codeine Rash  . Codeine Rash   Thank you for allowing pharmacy to be a part of this patient's care.  Jasenia Weilbacher, Tsz-Yin 06/30/2016 10:06 PM

## 2016-07-01 ENCOUNTER — Encounter (HOSPITAL_COMMUNITY): Payer: Self-pay

## 2016-07-01 DIAGNOSIS — Z86010 Personal history of colonic polyps: Secondary | ICD-10-CM

## 2016-07-01 DIAGNOSIS — I10 Essential (primary) hypertension: Secondary | ICD-10-CM

## 2016-07-01 DIAGNOSIS — Z8249 Family history of ischemic heart disease and other diseases of the circulatory system: Secondary | ICD-10-CM

## 2016-07-01 DIAGNOSIS — Z79899 Other long term (current) drug therapy: Secondary | ICD-10-CM

## 2016-07-01 DIAGNOSIS — Z87891 Personal history of nicotine dependence: Secondary | ICD-10-CM

## 2016-07-01 DIAGNOSIS — L03113 Cellulitis of right upper limb: Secondary | ICD-10-CM

## 2016-07-01 DIAGNOSIS — B9689 Other specified bacterial agents as the cause of diseases classified elsewhere: Secondary | ICD-10-CM

## 2016-07-01 DIAGNOSIS — Z833 Family history of diabetes mellitus: Secondary | ICD-10-CM

## 2016-07-01 LAB — BASIC METABOLIC PANEL
Anion gap: 6 (ref 5–15)
BUN: 5 mg/dL — AB (ref 6–20)
CALCIUM: 9.4 mg/dL (ref 8.9–10.3)
CO2: 33 mmol/L — ABNORMAL HIGH (ref 22–32)
Chloride: 102 mmol/L (ref 101–111)
Creatinine, Ser: 0.65 mg/dL (ref 0.44–1.00)
GFR calc Af Amer: >60 mL/min (ref >60)
GLUCOSE: 97 mg/dL (ref 65–99)
Potassium: 3.6 mmol/L (ref 3.5–5.1)
Sodium: 141 mmol/L (ref 135–145)

## 2016-07-01 LAB — CBC
HCT: 34.8 % — ABNORMAL LOW (ref 36.0–46.0)
HEMOGLOBIN: 11.6 g/dL — AB (ref 12.0–15.0)
MCH: 30.4 pg (ref 26.0–34.0)
MCHC: 33.3 g/dL (ref 30.0–36.0)
MCV: 91.1 fL (ref 78.0–100.0)
Platelets: 234 10*3/uL (ref 150–400)
RBC: 3.82 MIL/uL — ABNORMAL LOW (ref 3.87–5.11)
RDW: 13.1 % (ref 11.5–15.5)
WBC: 6.8 10*3/uL (ref 4.0–10.5)

## 2016-07-01 MED ORDER — DOXYCYCLINE HYCLATE 100 MG PO CAPS: 100.0000 mg | ORAL_CAPSULE | Freq: Two times a day (BID) | ORAL | 0 refills | Status: DC

## 2016-07-01 MED ORDER — IBUPROFEN 400 MG PO TABS: 400.0000 mg | ORAL_TABLET | Freq: Four times a day (QID) | ORAL | Status: DC | PRN

## 2016-07-01 NOTE — Progress Notes (Signed)
Patient reexamined today at bedside. Patient has full range of motion in her right hand with no pain over the flexor or extensor tendons. She can make a composite fist. Skin turgor is intact. There is no sign of infection. At this point time we'll recommend continuation of conservative measures. If her symptoms worsen we will be more than happy to see her as an outpatient office next week.

## 2016-07-01 NOTE — Progress Notes (Signed)
NURSING PROGRESS NOTE  Amanda Roman [id number] Discharge Data: 07/01/2016 4:32 PM Attending Provider: Lucious Groves, DO [id number], Damita Lack, MD   [name] to be D/C'd Home per MD order.    All IV's will be discontinued and monitored for bleeding.  All belongings will be returned to patient for patient to take home.  Last Documented Vital Signs:  Blood pressure (!) 131/55, pulse 62, temperature 98.5 F (36.9 C), temperature source Oral, resp. rate 19, height 4\' 8"  (1.422 m), weight 79.6 kg (175 lb 8 oz), SpO2 100 %.  Joslyn Hy, MSN, RN, [employer]

## 2016-07-01 NOTE — Progress Notes (Addendum)
   Subjective: Patient was evaluated this morning on rounds. She states that her right hand has improved in pain and  swelling and her hand is almost back to baseline. She states she is able to use her hand again and was eating breakfast only using her right hand.  She denied any fever or chills.  Objective:  Vital signs in last 24 hours: Vitals:   06/30/16 1711 06/30/16 2145 06/30/16 2159 07/01/16 0500  BP: (!) 153/62 (!) 150/69 (!) 152/60 140/62  Pulse: (!) 59  65 64  Resp: 18  20 20   Temp: 98 F (36.7 C)  98.2 F (36.8 C) 98 F (36.7 C)  TempSrc: Oral  Oral Oral  SpO2: 100%  95% 100%  Weight: 175 lb 8 oz (79.6 kg)     Height: 4\' 8"  (1.422 m)      Physical Exam  Constitutional: She is well-developed, well-nourished, and in no distress.  Cardiovascular: Normal rate, regular rhythm and normal heart sounds.  Exam reveals no gallop and no friction rub.   No murmur heard. Pulmonary/Chest: Effort normal and breath sounds normal. No respiratory distress. She has no wheezes. She has no rales.  Skin:  Right hand with no erythema and very minimal swelling on the dorsal aspect Denied any tenderness and had full range of motion of wrist and fingers     Assessment/Plan:  Cellulitis of right hand Surgery evaluated patient and does not think the right hand swelling and pain is due to be flexor sheath or joint infected at this time.  No surgery was indicated.  Hand x-ray showed no acute osseous abnormality. There is degenerative changes of the DIP and PIP joints. Patient was given a dose of IV Vancomycin yesterday and had improved dramatically this morning in hand swelling and pain. Patient will be discharged with 3 day course of Keflex and have follow-up in the internal medicine clinic.     - Appreciate surgery's recommendations - transition to keflex   Hypertension Blood pressure 140/62.  Patient takes amlodipine 10 mg daily, carvedilol 25 mg daily, hydrochlorothiazide 25 mg daily and  clonidine 0.3 mg daily -Continue blood pressure medications  Dispo: Anticipated discharge today.  Valinda Party, DO 07/01/2016, 10:04 AM Pager: [phone]

## 2016-07-05 LAB — CULTURE, BLOOD (ROUTINE X 2)
CULTURE: NO GROWTH
Culture: NO GROWTH

## 2016-07-12 ENCOUNTER — Other Ambulatory Visit: Payer: Self-pay | Admitting: Internal Medicine

## 2016-07-12 DIAGNOSIS — E669 Obesity, unspecified: Secondary | ICD-10-CM

## 2016-07-13 ENCOUNTER — Ambulatory Visit: Payer: Medicare Other

## 2016-07-13 ENCOUNTER — Ambulatory Visit (INDEPENDENT_AMBULATORY_CARE_PROVIDER_SITE_OTHER): Payer: Medicare Other | Admitting: Internal Medicine

## 2016-07-13 DIAGNOSIS — Z09 Encounter for follow-up examination after completed treatment for conditions other than malignant neoplasm: Secondary | ICD-10-CM

## 2016-07-13 DIAGNOSIS — E669 Obesity, unspecified: Secondary | ICD-10-CM

## 2016-07-13 DIAGNOSIS — Z872 Personal history of diseases of the skin and subcutaneous tissue: Secondary | ICD-10-CM | POA: Diagnosis not present

## 2016-07-13 DIAGNOSIS — L03113 Cellulitis of right upper limb: Secondary | ICD-10-CM

## 2016-07-13 NOTE — Patient Instructions (Addendum)
Ms. Demke,  I am glad your hand is doing better. Try to use Vaseline or other similar products to keep your hands protected.   I would like you schedule an appointment with your PCP in the next 3 months.

## 2016-07-13 NOTE — Discharge Summary (Signed)
Name: Amanda Roman MRN: [id number] DOB: 1940/08/25 76 y.o. PCP: Oval Linsey, MD  Date of Admission: 06/30/2016  5:05 PM Date of Discharge: 07/01/2016 Attending Physician: Joni Reining DO  Discharge Diagnosis: 1. Cellulitis of right hand Active Problems:   Cellulitis of right hand   Discharge Medications: Allergies as of 07/01/2016      Reactions   Ace Inhibitors Other (See Comments)   angioedema   Penicillins Itching   Has patient had a PCN reaction causing immediate rash, facial/tongue/throat swelling, SOB or lightheadedness with hypotension: Yes Has patient had a PCN reaction causing severe rash involving mucus membranes or skin necrosis: No Has patient had a PCN reaction that required hospitalization No Has patient had a PCN reaction occurring within the last 10 years: No If all of the above answers are "NO", then may proceed with Cephalosporin use.   Acetaminophen-codeine Rash   Codeine Rash      Medication List    TAKE these medications   amLODipine 10 MG tablet Commonly known as:  NORVASC Take 1 tablet (10 mg total) by mouth daily.   ARTHRITIS PAIN RELIEF RUB EX Apply 1 application topically daily as needed (knee pain).   aspirin 81 MG chewable tablet Commonly known as:  ASPIRIN CHILDRENS Chew 1 tablet (81 mg total) by mouth daily.   calcium-vitamin D 500 MG tablet Take 1 tablet by mouth 2 (two) times daily.   carvedilol 25 MG tablet Commonly known as:  COREG Take 1 tablet (25 mg total) by mouth 2 (two) times daily with a meal.   cloNIDine 0.3 MG tablet Commonly known as:  CATAPRES Take 1 tablet (0.3 mg total) by mouth 2 (two) times daily.   hydrochlorothiazide 25 MG tablet Commonly known as:  HYDRODIURIL Take 1 tablet (25 mg total) by mouth daily.   multivitamin with minerals Tabs tablet Take 2 tablets by mouth daily. Centrum Silver   Potassium Chloride ER 20 MEQ Tbcr Take 40 mEq by mouth daily.       Disposition and follow-up:     [name] was discharged from [facility] in good condition.  At the hospital follow up visit please address:  1.  Antibiotic compliance, any issues with right hand  2.  Labs / imaging needed at time of follow-up: none  3.  Pending labs/ test needing follow-up: none  Follow-up Appointments:   Hospital Course by problem list: Active Problems:   Cellulitis of right hand   1. Cellulitis of right hand [name] a 76 year old female who was admitted from the internal medicine clinic on 06/30/2016 with cellulitis of the right hand.  Ultrasound revealed inflammation without any joint space fluid collection. There was concern for tenosynovitis and surgery was consulted.  Hand x-ray showed no acute osseous abnormality.  It was felt that the right hand swelling and pain was not thought to be due to flexor sheath or joint infection and surgery was not indicated during admission.  Patient was given a dose of IV Vancomycin with improvement the following day.  She was discharged on a 3 day course of doxycycline to complete a 5 day course of antibiotics.  2. Hypertension Stable during admission. She was continued on her home blood pressure medications of carvedilol 25 mg daily, hydrocortisone 25 mg daily and clonidine 0.3 mg daily.  Discharge Vitals:   BP (!) 131/55 (BP Location: Left Arm)   Pulse 62   Temp 98.5 F (36.9 C) (Oral)   Resp  19   Ht 4\' 8"  (1.422 m)   Wt 175 lb 8 oz (79.6 kg)   SpO2 100%   BMI 39.35 kg/m   Pertinent Labs, Studies, and Procedures:  Right hand x-ray 06/30/16 >> No acute osseous abnormality. Degenerative changes of the DIP and PIP joints.  Discharge Instructions: Discharge Instructions    Discharge instructions    Complete by:  As directed    Please take your antibiotics 2 times a day for 3 days. Please call the clinic if symptoms worsen Please follow up with your primary care doctor after discharge.      Signed: Valinda Party, DO 07/13/2016, 7:45 PM   Pager: [phone]

## 2016-07-13 NOTE — Patient Instructions (Addendum)
Your weight was 178.1 pounds today- congrats on lowering your weight even over the holidays!  Your blood pressure was excellent also.   Your goal for the next 2 weeks is to try to get to one [employer] class.   Call anytime you have questions.  Butch Penny [phone]

## 2016-07-13 NOTE — Assessment & Plan Note (Signed)
She was recently admitted to [facility] on 12/22-12/23 for increased swelling in her right hand with concerns for tenosynovitis. The swelling for started after washing collard greens and progressed over 2 days. It was associated with redness and warmth. There was also pain is rated as great as a 10 out of 10 in severity. Over-the-counter arthritis ointment did not help. In clinic she was noted to have redness over the palmar aspect as well as some of the dorsal aspect of her hand, no obvious skin breaks, and concerned of limited motion in the third and fourth digits. Therefore she was admitted for surgical consult for evaluation of tenosynovitis with overlying cellulitis. Hand surgery evaluated the patient and were not concerned for tenosynovitis given improved motion of her digits. Her hand dramatically improved overnight with only IV vancomycin. Felt to be stable for discharge with a short course of oral antibiotics for right hand cellulitis. Provided 3 additional days of doxycyline at discharge to complete a 5 day course of antibiotics with close return precautions. Blood cultures drawn on admission returned with no growth. Never had any fevers or elevated white count.   Today, she reports completing the course of doxycyline. Hand swelling has improved has denies any pain, erythema, warmth or restriction in her movement. No fevers or chills.   Plan Cellulitis resolved. Possibly secondary to small cuts in her hand. Advised to keep Vaseline or other similar product on hands.
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
## 2016-07-13 NOTE — Progress Notes (Signed)
  Medical Nutrition Therapy:  Appt start time: 0918 end time:  0945. Visit # 6  Assessment:  Primary concerns today: weight loss and healthy eating  Amanda Roman has not attended [employer] since before Thanksgiving. She continues to omit sugary drinks and make healthy food choices.  ANTHROPOMETRICS: weight today- 178.1# Decreased ~ 3# in last 2 months.,  she maintains obese class II. Her goal is 175#.   DIETARY INTAKE:  24-hr recall: estimated 1300-1400 calories B ( 8 AM):  oatmeal, peaches, water or or honey oats/raisin bran and milk L ( 11PM):  Chicken, peas, collards Snk (3 PM): fruit D ( 5 PM): [place] wing, vegetables, cornbread as a treat  Snk ( PM): sugar free gelatin, granola bar, peaches Beverages: water, milk, coffee, unsweet tea, sugar free ginger ale  Usual physical activity: cleans house, watches TV 2-3 hours a day  Estimated calories needs for weight loss of 1#/week: 1400-1500 calories/day   Progress Towards Goal(s):  Some progress.   Nutritional Diagnosis:  NB-2.1 Physical inactivity As related to competing values is reinitiated As evidenced by her report of not going to the [facility] in past 2 months. NI-1.6 Predicted suboptional energy  As related to her cutting back of portions and using lite and lowfat foods and cooking methods has improved As evidenced by her weight loss.    Intervention:  Nutrition education about maintaining healthy eating and exercise behaviors which are most important to her health  Teaching Method Utilized: Visual ,  Auditory, visual Handouts given during visit include: AVS, glass with juice markings Barriers to learning/adherence to lifestyle change: difficulty with transportation and material resources Demonstrated degree of understanding via:  Teach Back   Monitoring/Evaluation:  Dietary intake, exercise, and body weight 12 weeks. [place], [place] 07/13/2016 10:02 AM.

## 2016-07-14 ENCOUNTER — Other Ambulatory Visit: Payer: Self-pay | Admitting: *Deleted

## 2016-07-14 DIAGNOSIS — I10 Essential (primary) hypertension: Secondary | ICD-10-CM

## 2016-07-14 MED ORDER — HYDROCHLOROTHIAZIDE 25 MG PO TABS: 25.0000 mg | ORAL_TABLET | Freq: Every day | ORAL | 3 refills | Status: DC

## 2016-07-14 MED ORDER — POTASSIUM CHLORIDE ER 20 MEQ PO TBCR
40.0000 meq | EXTENDED_RELEASE_TABLET | Freq: Every day | ORAL | 3 refills | Status: DC
Start: 2016-07-14 — End: 2017-05-11

## 2016-07-14 MED ORDER — CARVEDILOL 25 MG PO TABS: 25.0000 mg | ORAL_TABLET | Freq: Two times a day (BID) | ORAL | 3 refills | Status: DC

## 2016-07-14 MED ORDER — AMLODIPINE BESYLATE 10 MG PO TABS: 10.0000 mg | ORAL_TABLET | Freq: Every day | ORAL | 3 refills | Status: DC

## 2016-07-14 NOTE — Progress Notes (Signed)
Internal Medicine Clinic Attending  Case discussed with Dr. Boswell at the time of the visit.  We reviewed the resident's history and exam and pertinent patient test results.  I agree with the assessment, diagnosis, and plan of care documented in the resident's note.  

## 2016-07-18 ENCOUNTER — Other Ambulatory Visit: Payer: Self-pay | Admitting: *Deleted

## 2016-07-18 DIAGNOSIS — I10 Essential (primary) hypertension: Secondary | ICD-10-CM

## 2016-07-19 MED ORDER — CLONIDINE HCL 0.3 MG PO TABS: 0.3000 mg | ORAL_TABLET | Freq: Two times a day (BID) | ORAL | 3 refills | Status: DC

## 2016-07-20 ENCOUNTER — Other Ambulatory Visit: Payer: Self-pay

## 2016-07-20 DIAGNOSIS — I10 Essential (primary) hypertension: Secondary | ICD-10-CM

## 2016-07-20 NOTE — Telephone Encounter (Signed)
Patient calling again about her Clonidine.  Patient would like you to call back to her Cell Number [phone] instead of the house number.

## 2016-07-20 NOTE — Telephone Encounter (Signed)
Have called both#'s twice, lm for rtc

## 2016-07-20 NOTE — Telephone Encounter (Signed)
Pt is calling back again about her clonidine. Please call back.

## 2016-07-20 NOTE — Telephone Encounter (Signed)
cloNIDine (CATAPRES) 0.3 MG tablet, is not at pharmacy. Please call pt back.

## 2016-07-21 MED ORDER — CLONIDINE HCL 0.3 MG PO TABS: 0.3000 mg | ORAL_TABLET | Freq: Two times a day (BID) | ORAL | 0 refills | Status: DC

## 2016-07-21 NOTE — Telephone Encounter (Signed)
Talked to pt - stated [facility] mail [facility] pharmacy will not be able to mail Clonidine until the 18th. Can u send another rx to [facility] pharmacy?  Thanks

## 2016-07-21 NOTE — Telephone Encounter (Signed)
Pt is returning a call. Please call back.  

## 2016-07-21 NOTE — Telephone Encounter (Signed)
Called pt to let her know of Clonidine rx - no answer; left message.

## 2016-08-16 ENCOUNTER — Telehealth: Payer: Self-pay | Admitting: *Deleted

## 2016-08-16 NOTE — Telephone Encounter (Signed)
Call made to [facility] office for last colonoscopy and path report-Request granted, office to fax records.Regenia Skeeter, Darlene Cassady2/7/20184:01 PM

## 2016-10-11 ENCOUNTER — Ambulatory Visit: Payer: Medicare Other | Admitting: Dietician

## 2016-10-12 ENCOUNTER — Telehealth: Payer: Self-pay | Admitting: Dietician

## 2016-10-12 ENCOUNTER — Encounter: Payer: Self-pay | Admitting: Dietician

## 2016-10-12 NOTE — Telephone Encounter (Signed)
Rescheduled missed appointment

## 2016-10-13 ENCOUNTER — Ambulatory Visit (INDEPENDENT_AMBULATORY_CARE_PROVIDER_SITE_OTHER): Payer: Medicare Other | Admitting: Internal Medicine

## 2016-10-13 ENCOUNTER — Encounter: Payer: Self-pay | Admitting: Internal Medicine

## 2016-10-13 ENCOUNTER — Encounter (INDEPENDENT_AMBULATORY_CARE_PROVIDER_SITE_OTHER): Payer: Self-pay

## 2016-10-13 ENCOUNTER — Ambulatory Visit (INDEPENDENT_AMBULATORY_CARE_PROVIDER_SITE_OTHER): Payer: Medicare Other | Admitting: Dietician

## 2016-10-13 DIAGNOSIS — Z713 Dietary counseling and surveillance: Secondary | ICD-10-CM | POA: Diagnosis not present

## 2016-10-13 DIAGNOSIS — E669 Obesity, unspecified: Secondary | ICD-10-CM

## 2016-10-13 DIAGNOSIS — M150 Primary generalized (osteo)arthritis: Secondary | ICD-10-CM | POA: Diagnosis not present

## 2016-10-13 DIAGNOSIS — I10 Essential (primary) hypertension: Secondary | ICD-10-CM

## 2016-10-13 DIAGNOSIS — I872 Venous insufficiency (chronic) (peripheral): Secondary | ICD-10-CM

## 2016-10-13 DIAGNOSIS — Z6841 Body Mass Index (BMI) 40.0 and over, adult: Secondary | ICD-10-CM

## 2016-10-13 DIAGNOSIS — M159 Polyosteoarthritis, unspecified: Secondary | ICD-10-CM

## 2016-10-13 DIAGNOSIS — Z79899 Other long term (current) drug therapy: Secondary | ICD-10-CM

## 2016-10-13 DIAGNOSIS — Z634 Disappearance and death of family member: Secondary | ICD-10-CM

## 2016-10-13 DIAGNOSIS — Z0000 Encounter for general adult medical examination without abnormal findings: Secondary | ICD-10-CM

## 2016-10-13 DIAGNOSIS — Z87891 Personal history of nicotine dependence: Secondary | ICD-10-CM

## 2016-10-13 NOTE — Assessment & Plan Note (Signed)
Assessment  Her weight is up 7 pounds since she was last seen in clinic. She had done a wonderful job losing weight by watching her diet as well as by exercising through the [employer] program. She's had a rough several months stretch during the first part of 2018 and her weight is up 7 pounds. That being said, she is ready to restart exercising and plans on starting back with the [employer] program tomorrow.  Plan  She was encouraged to revert back to the dietary changes that she had made when she had lost all of that weight. She was also praised for her desire and plan to restart the [employer] exercise program. We will reassess her weight at the follow-up visit.

## 2016-10-13 NOTE — Assessment & Plan Note (Signed)
She is currently up-to-date on her healthcare maintenance. 

## 2016-10-13 NOTE — Assessment & Plan Note (Signed)
Assessment  She continues to have some pain associated with arthritis in multiple joints, particularly her hands and knees. Her knee pain has improved with her weight loss. She now is taking an over-the-counter arthritis cream for her hands and feels this is effective.  Plan  She was encouraged to continue with the over-the-counter arthritis hand cream and to restart her exercise program through [employer] to continue to lose weight and decrease the pressure on her knees. We will reassess the efficacy of these therapies in managing her arthritis pain at the follow-up visit.

## 2016-10-13 NOTE — Assessment & Plan Note (Signed)
Assessment  Her blood pressure is at target today 138/76. This is on hydrochlorothiazide 25 mg by mouth daily, carvedilol 25 mg by mouth twice daily, amlodipine 10 mg by mouth daily, and clonidine 0.3 mg twice daily. She is tolerating this regimen well.  Plan  We will continue the hydrochlorothiazide, carvedilol, amlodipine, and clonidine at the current doses and reassess the blood pressure control on this regimen at the follow-up visit.

## 2016-10-13 NOTE — Patient Instructions (Signed)
It was wonderful to see you again!  I am sorry to hear about your nephew and your bother's death.  I will be thinking about you and your family.  I hope you have a good time at your family reunions in [place] and [place].  1) Keep taking the medications as you are.  2) Keep exercising with silver sneakers.  I will see you in 6 months, sooner if necessary.

## 2016-10-13 NOTE — Patient Instructions (Signed)
Your goal to get back to [employer]NE[employer]:  1- Arrange for a ride Monday April 9th and Wednesday April 11th 2- Attend [employer] class on Monday April 9th 3- Attend [employer] Wednesday, April 11th 4- Continue to go to [employer] every Monday and Wednesday  Snacks- try to eat fruit when you want something sweet

## 2016-10-13 NOTE — Progress Notes (Signed)
   Subjective:    Patient ID: Amanda Roman, female    DOB: [birth year], 76 y.o.   MRN: [id number]  HPI  Amanda Roman is here for follow-up of her blood pressure, weight, and arhtritis. Please see the A&P for the status of the pt's chronic medical problems.  Since I last saw her she was admitted to the hospital for a tenosynovitis of her right hand. This responded rapidly to antibiotics and has returned back to baseline without any further problems. Also in the interim she lost her nephew in a car accident and her younger brother who died perioperatively. Obviously, this is saddened her. Because of these funerals and the food associated with them her weight has increased. That being said, she plans on restarting exercising through the [employer] program tomorrow.  Review of Systems  Constitutional: Negative for activity change, appetite change, fatigue and unexpected weight change.  HENT: Positive for congestion, rhinorrhea and sneezing.   Respiratory: Negative for chest tightness, shortness of breath and wheezing.   Cardiovascular: Positive for leg swelling. Negative for chest pain and palpitations.  Gastrointestinal: Negative for abdominal pain, constipation, diarrhea, nausea and vomiting.  Musculoskeletal: Positive for arthralgias and gait problem. Negative for back pain, joint swelling and myalgias.  Skin: Negative for color change and rash.  Neurological: Negative for weakness and light-headedness.  Psychiatric/Behavioral: Positive for dysphoric mood. The patient is not nervous/anxious.       Objective:   Physical Exam  Constitutional: She is oriented to person, place, and time. She appears well-developed and well-nourished. No distress.  HENT:  Head: Normocephalic.  Eyes: Conjunctivae are normal. Right eye exhibits no discharge. Left eye exhibits no discharge. No scleral icterus.  Cardiovascular: Normal rate, regular rhythm and normal heart sounds.  Exam reveals no gallop and  no friction rub.   No murmur heard. Pulmonary/Chest: Effort normal and breath sounds normal. No respiratory distress. She has no wheezes. She has no rales.  Abdominal: Soft. Bowel sounds are normal. She exhibits no distension. There is no tenderness. There is no rebound and no guarding.  Musculoskeletal: Normal range of motion. She exhibits edema and deformity. She exhibits no tenderness.  Neurological: She is alert and oriented to person, place, and time. She exhibits normal muscle tone. Coordination normal.  Skin: Skin is warm and dry. No rash noted. She is not diaphoretic. No erythema.  Psychiatric: She has a normal mood and affect. Her behavior is normal. Judgment and thought content normal.  Nursing note and vitals reviewed.     Assessment & Plan:   Please see problem oriented charting.

## 2016-10-13 NOTE — Assessment & Plan Note (Signed)
Assessment  Her chronic venous insufficiency is stable at this time. She is taking hydrochlorothiazide 25 mg by mouth daily although we are unsure if this is helpful.  Plan  We will continue to follow her chronic venous insufficiency clinically and continue the hydrochlorothiazide 25 mg by mouth daily.

## 2016-10-13 NOTE — Progress Notes (Signed)
  Medical Nutrition Therapy:  Appt start time: 0921 end time:  0948. Visit # 2 this year (7 in total)  Assessment:  Primary concerns today: weight loss and healthy eating  Amanda Roman has not attended [employer] because of arthritis I her hand and family funerals.  She gained 7# since January. She attributes this to the travel and eat foods she doesn't;t usually eat associated with the family deaths. She was abel to continue to omit sugary drinks and is trying to get back to eating her regular foods. She reports having to stop and take breaks while cleaning her house. Usual physical activity: cleans house, watches TV 2-3 hours a day ANTHROPOMETRICS: weight today- 185.2#, BMI 41   DIETARY INTAKE:  24-hr recall: estimated 1300-1400 calories B ( 8 AM):  oatmeal, peaches, water  L ( 11PM):  Big salad with grille Chicken, water Snk (3 PM): fruit D ( 5 PM): spaghetti with ground [place], peppers, onions and a salad  Beverages: water, milk, coffee, unsweet tea, sugar free ginger ale  Estimated calories needs for weight loss of 1#/week: 1400-1500 calories/day   Progress Towards Goal(s):  No progress.   Nutritional Diagnosis:  NB-2.1 Physical inactivity As related to competing values is reinitiated As evidenced by her report of not going to the [facility] and planning to go next week.  NI-1.6 Predicted suboptional energy  As related to her cutting back of portions and using lite and lowfat foods and cooking methods has regressed As evidenced by her weight gain.    Intervention:  Nutrition education about how to ask for support from family to maintain healthy eating and exercise behaviors during stressful time. Goal setting to assist her with starting back to silver sneakers.  Teaching Method Utilized: Visual ,  Auditory, visual Handouts given during visit include: AVS, shopping list and tips Barriers to learning/adherence to lifestyle change: difficulty with transportation and material  resources Demonstrated degree of understanding via:  Teach Back   Monitoring/Evaluation:  Dietary intake, exercise, and body weight 12 weeks. Amanda Roman, Butch Penny, RD 10/13/2016 9:51 AM.

## 2016-10-16 ENCOUNTER — Telehealth: Payer: Self-pay

## 2016-10-16 NOTE — Telephone Encounter (Signed)
Called pt, she states she got her medicine today in the mail and everything is ok

## 2016-10-16 NOTE — Telephone Encounter (Signed)
Need to speak with a nurse about carvedilol (COREG) 25 MG tablet, please call back.

## 2016-10-16 NOTE — Telephone Encounter (Signed)
Pt is calling back to speak with a nurse. Please call pt back.

## 2017-01-11 ENCOUNTER — Other Ambulatory Visit: Payer: Self-pay | Admitting: Internal Medicine

## 2017-01-11 DIAGNOSIS — I10 Essential (primary) hypertension: Secondary | ICD-10-CM

## 2017-01-11 MED ORDER — CARVEDILOL 25 MG PO TABS: 25.0000 mg | ORAL_TABLET | Freq: Two times a day (BID) | ORAL | 3 refills | Status: DC

## 2017-01-11 NOTE — Telephone Encounter (Signed)
NEEDS REFILL, CARBEDILO, 25MG . [facility]

## 2017-04-13 ENCOUNTER — Encounter (INDEPENDENT_AMBULATORY_CARE_PROVIDER_SITE_OTHER): Payer: Self-pay

## 2017-04-13 ENCOUNTER — Encounter: Payer: Self-pay | Admitting: Internal Medicine

## 2017-04-13 ENCOUNTER — Ambulatory Visit (INDEPENDENT_AMBULATORY_CARE_PROVIDER_SITE_OTHER): Payer: Medicare Other | Admitting: Internal Medicine

## 2017-04-13 VITALS — BP 136/69 | HR 62 | Temp 98.1°F | Wt 181.3 lb

## 2017-04-13 DIAGNOSIS — M150 Primary generalized (osteo)arthritis: Secondary | ICD-10-CM | POA: Diagnosis not present

## 2017-04-13 DIAGNOSIS — I872 Venous insufficiency (chronic) (peripheral): Secondary | ICD-10-CM | POA: Diagnosis not present

## 2017-04-13 DIAGNOSIS — M159 Polyosteoarthritis, unspecified: Secondary | ICD-10-CM

## 2017-04-13 DIAGNOSIS — Z6841 Body Mass Index (BMI) 40.0 and over, adult: Secondary | ICD-10-CM | POA: Diagnosis not present

## 2017-04-13 DIAGNOSIS — I10 Essential (primary) hypertension: Secondary | ICD-10-CM

## 2017-04-13 DIAGNOSIS — Z0000 Encounter for general adult medical examination without abnormal findings: Secondary | ICD-10-CM
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
## 2017-04-13 NOTE — Assessment & Plan Note (Signed)
She is currently up-to-date on her healthcare maintenance. 

## 2017-04-13 NOTE — Assessment & Plan Note (Signed)
Assessment  Her blood pressure today was at target at 136/69. This is on amlodipine 10 mg by mouth daily, carvedilol 25 mg by mouth twice daily, clonidine 0.3 mg by mouth twice daily, and hydrochlorothiazide 25 mg by mouth daily.  Plan  We will continue the amlodipine 10 mg by mouth daily, carvedilol 25 mg by mouth twice daily, clonidine 0.3 mg by mouth twice daily, and hydrochlorothiazide 25 mg by mouth daily. We will reassess the blood pressure control on this regimen at the follow-up visit.

## 2017-04-13 NOTE — Assessment & Plan Note (Signed)
Assessment  Her weight is down 4 pounds despite the increased lower extremity swelling. She was praised for this achievement. She was also educated on the importance of weight loss given her worsening left knee arthralgia and the fact that each pound lost reduces 40 pounds of pressure on the knee joint.  Plan  We discussed appropriate dietary changes, in particular, limiting the amount of starchy vegetables that she loves. She was encouraged to continue to keep the green leafy he vegetables that she likes, as well as the baked chicken and fish. We will reassess her success at further weight loss at the follow-up visit.

## 2017-04-13 NOTE — Assessment & Plan Note (Signed)
Assessment  She states her chronic venous insufficiency has worsened bilaterally. She has always had worsening swelling on the left compared to the right and this remains true. The physical exam confirms that her bilateral lower extremity edema is greater than baseline and worse on the left than the right. The worsening began several months ago and persist despite her efforts at keeping her legs elevated, particularly at night. She does state the swelling goes down somewhat during the night, but then recurs as she is up and about throughout the day.  Plan  Given the notable worsening in her venous insufficiency, we will obtain a bilateral lower extremity Doppler examination to assure no evidence of deep venous thromboses. My suspicion is low, but she does have chronic venous stasis and likely has been less active recently with the worsening in her left knee pain. We will continue the hydrochlorothiazide at 25 mg by mouth daily for now albeit I'm not sure this is having any effect. She was encouraged to continue to keep her legs elevated, as she has, when she is not up and about. If this continues to impact upon ambulation we will consider the addition of Lasix in place of the hydrochlorothiazide at the follow-up visit in order to decrease the swelling so as not to impact upon ambulation. I do not believe that she is capable of placing compression stockings at this point.

## 2017-04-13 NOTE — Patient Instructions (Signed)
It was good to see you again.  Great job with loosing the 4 pounds!  1) Keep taking the medications as you are.  2) We will work on getting you a walker to help with keeping you active.  3) Keep trying to remain active, like water aerobics.  4) I will fill out any paperwork they give me for a care assistant.  5) I will order an ultrasound of your leg veins to make sure we are not missing anything with the increased swelling.  Keep the leg elevated as you have been when ever you can.  I will see you in 6 months, sooner if necessary.

## 2017-04-13 NOTE — Assessment & Plan Note (Signed)
Assessment  She continues to have arthralgias in several joints but her left knee is giving her the most problem. She is having increasing difficulty ambulating with just her cane and feels she requires a walker. She also notes symptoms consistent with bilateral calf claudication with ambulation that improves with sitting. Thus, a walker with a chair may be ideal.  Plan  We will try to get her a rolling walker with a chair. I am hopeful this will actually increase her ambulation and therefore may slightly worsened in her claudication. That being said, it was recommended she remain active and push herself to mild claudication when possible in order to improve blood flow and functionality. She will also continue with her over-the-counter capsacian cream which does provide her with some symptomatic relief. We will reassess her symptoms of arthralgia and possible claudication at the follow-up visit with these changes.

## 2017-04-18 DIAGNOSIS — H25093 Other age-related incipient cataract, bilateral: Secondary | ICD-10-CM | POA: Diagnosis not present

## 2017-04-18 DIAGNOSIS — M069 Rheumatoid arthritis, unspecified: Secondary | ICD-10-CM | POA: Diagnosis not present

## 2017-04-30 DIAGNOSIS — M150 Primary generalized (osteo)arthritis: Secondary | ICD-10-CM | POA: Diagnosis not present

## 2017-05-11 ENCOUNTER — Other Ambulatory Visit: Payer: Self-pay | Admitting: *Deleted

## 2017-05-11 DIAGNOSIS — I10 Essential (primary) hypertension: Secondary | ICD-10-CM

## 2017-05-11 MED ORDER — POTASSIUM CHLORIDE ER 20 MEQ PO TBCR: 40.0000 meq | EXTENDED_RELEASE_TABLET | Freq: Every day | ORAL | 3 refills | Status: DC

## 2017-05-11 MED ORDER — CLONIDINE HCL 0.3 MG PO TABS: 0.3000 mg | ORAL_TABLET | Freq: Two times a day (BID) | ORAL | 3 refills | Status: DC

## 2017-05-11 MED ORDER — AMLODIPINE BESYLATE 10 MG PO TABS
10.0000 mg | ORAL_TABLET | Freq: Every day | ORAL | 3 refills | Status: DC
Start: 2017-05-11 — End: 2019-09-09

## 2017-06-26 ENCOUNTER — Other Ambulatory Visit: Payer: Self-pay | Admitting: Internal Medicine

## 2017-06-26 DIAGNOSIS — I10 Essential (primary) hypertension: Secondary | ICD-10-CM

## 2017-07-16 ENCOUNTER — Telehealth: Payer: Self-pay | Admitting: Internal Medicine

## 2017-07-16 DIAGNOSIS — I10 Essential (primary) hypertension: Secondary | ICD-10-CM

## 2017-07-16 NOTE — Telephone Encounter (Signed)
carvedilol (COREG) 25 MG tablet, Refill request @ [facility] on [address].

## 2017-07-20 ENCOUNTER — Other Ambulatory Visit: Payer: Self-pay | Admitting: Internal Medicine

## 2017-07-20 DIAGNOSIS — I10 Essential (primary) hypertension: Secondary | ICD-10-CM

## 2017-07-20 MED ORDER — CARVEDILOL 25 MG PO TABS: 25.0000 mg | ORAL_TABLET | Freq: Two times a day (BID) | ORAL | 3 refills | Status: DC

## 2017-07-20 NOTE — Telephone Encounter (Signed)
Patient said she still haven't got the Carvedilol, she had enough pills to last until Friday when called but now patient is out. Pls call patient

## 2017-07-27 ENCOUNTER — Encounter (HOSPITAL_COMMUNITY): Payer: Self-pay | Admitting: Family Medicine

## 2017-07-27 ENCOUNTER — Ambulatory Visit (HOSPITAL_COMMUNITY)
Admission: RE | Admit: 2017-07-27 | Discharge: 2017-07-27 | Disposition: A | Discharge status: Home / Self Care | Payer: Medicare Other | Source: Ambulatory Visit | Attending: Emergency Medicine | Admitting: Emergency Medicine

## 2017-07-27 ENCOUNTER — Ambulatory Visit (HOSPITAL_COMMUNITY)
Admission: EM | Admit: 2017-07-27 | Discharge: 2017-07-27 | Disposition: A | Discharge status: Home / Self Care | Payer: Medicare Other | Attending: Emergency Medicine | Admitting: Emergency Medicine

## 2017-07-27 DIAGNOSIS — M7989 Other specified soft tissue disorders: Secondary | ICD-10-CM | POA: Diagnosis not present

## 2017-07-27 DIAGNOSIS — R600 Localized edema: Secondary | ICD-10-CM | POA: Diagnosis not present

## 2017-07-27 DIAGNOSIS — R609 Edema, unspecified: Secondary | ICD-10-CM

## 2017-07-27 MED ORDER — MUPIROCIN CALCIUM 2 % EX CREA: 1.0000 "application " | TOPICAL_CREAM | Freq: Three times a day (TID) | CUTANEOUS | 0 refills | Status: AC

## 2017-07-27 MED ORDER — DOXYCYCLINE HYCLATE 100 MG PO CAPS: 100.0000 mg | ORAL_CAPSULE | Freq: Two times a day (BID) | ORAL | 0 refills | Status: AC
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
## 2017-07-27 NOTE — Discharge Instructions (Signed)
Please go get an ultrasound of lower extremity to check for blood clot go to admitting and vascular lab. If there is a blood clot please go to emergency room.   Please take doxycycline twice daily for 10 days to help with infection in the leg. Apply Bactroban cream to ulcered area.  Follow up with primary doctor and wound care.
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
## 2017-07-27 NOTE — Progress Notes (Signed)
*  PRELIMINARY RESULTS* Vascular Ultrasound Left lower extremity venous duplex has been completed.  Preliminary findings: No evidence of deep vein thrombosis in the visualized veins of the left lower extremity.  Negative for baker's cyst on the left.  Difficult exam due to patient body habitus and edema.  Preliminary results called to [facility] @ 16:50, patient okay to leave.   Everrett Coombe 07/27/2017, 5:04 PM

## 2017-08-09 ENCOUNTER — Other Ambulatory Visit: Payer: Self-pay

## 2017-08-09 ENCOUNTER — Ambulatory Visit (INDEPENDENT_AMBULATORY_CARE_PROVIDER_SITE_OTHER): Payer: Medicare Other | Admitting: Internal Medicine

## 2017-08-09 ENCOUNTER — Encounter: Payer: Self-pay | Admitting: Internal Medicine

## 2017-08-09 VITALS — BP 185/84 | HR 80 | Temp 97.9°F | Wt 176.1 lb

## 2017-08-09 DIAGNOSIS — M1990 Unspecified osteoarthritis, unspecified site: Secondary | ICD-10-CM

## 2017-08-09 DIAGNOSIS — Z79899 Other long term (current) drug therapy: Secondary | ICD-10-CM

## 2017-08-09 DIAGNOSIS — M79605 Pain in left leg: Secondary | ICD-10-CM | POA: Diagnosis not present

## 2017-08-09 DIAGNOSIS — Z87891 Personal history of nicotine dependence: Secondary | ICD-10-CM | POA: Diagnosis not present

## 2017-08-09 DIAGNOSIS — I10 Essential (primary) hypertension: Secondary | ICD-10-CM

## 2017-08-09 DIAGNOSIS — I872 Venous insufficiency (chronic) (peripheral): Secondary | ICD-10-CM

## 2017-08-09 DIAGNOSIS — E785 Hyperlipidemia, unspecified: Secondary | ICD-10-CM

## 2017-08-09 MED ORDER — FUROSEMIDE 20 MG PO TABS: 20.0000 mg | ORAL_TABLET | Freq: Every day | ORAL | 0 refills | Status: DC

## 2017-08-09 MED ORDER — SILVER SULFADIAZINE 1 % EX CREA: TOPICAL_CREAM | CUTANEOUS | 0 refills | Status: DC
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
## 2017-08-09 NOTE — Progress Notes (Signed)
Medicine attending: Medical history, presenting problems, physical findings, and medications, reviewed with resident physician Dr Colbert Ewing on the day of the patient visit and I concur with her evaluation and management plan. The pt has developed a venous stasis ulcer. In fact, doppler studies were done earlier this month and showed no signs of DVT, right or left. We will treat with topical sulfadene cream. Wound Center referral.

## 2017-08-09 NOTE — Assessment & Plan Note (Signed)
Assessment Approximately 2 weeks ago, she developed a sore on the lateral side of her left leg. She was seen 2 weeks ago by Urgent Care, where bilateral LE U/S was negative for DVT and completed a course of doxycycline for 7 days. On exam, she has bilateral lower extremity swelling, left greater than right. The ulcer does not appear infected. She is afebrile with stable vital signs.  Ulcer on lateral left lower extremity most likely related to chronic venous insufficiency. Will consult wound care as well as work on decreasing swelling to help with wound healing.  Plan - Encouraged to keep legs elevated - Referral to wound clinic placed - Apply silver sulfadiazine cream daily - Discontinued HCTZ - Start lasix 20mg  once a day - F/u in 2 weeks for BMET

## 2017-08-09 NOTE — Assessment & Plan Note (Signed)
Assessment BP 185/84. Denies symptoms. She is on amlodipine 10 mg daily, clonidine 0.3 mg twice a day, carvedilol 25 mg twice a day, and HCTZ 25 mg daily. She has not taken her blood pressure medications this morning.  Plan - Continue amlodipine, clonidine, and carvedilol - Discontinue HCTZ - Start furosemide 20 mg once a day - F/u in 2 weeks for BMET and re-check of BP

## 2017-08-09 NOTE — Patient Instructions (Addendum)
FOLLOW-UP INSTRUCTIONS When: 2 weeks For: left leg check What to bring: medications   Amanda Roman,  It was a pleasure to meet you today.  For your leg sore, I am placing a referral for wound clinic. They will call you to let you know when to come in.  For your leg swelling, please stop taking the HCTZ. Please start taking lasix (furosemide) 20mg  once a day.  Please return to clinic in 2 weeks to check your left leg and labs.

## 2017-08-15 ENCOUNTER — Telehealth: Payer: Self-pay | Admitting: *Deleted

## 2017-08-15 NOTE — Telephone Encounter (Signed)
[facility] [place] [phone]). REFERRAL IN PROGRESS/ APPOINTMENTS ARE ABOUT 2 WEEKS OUT. DAVID WILL CONTACT PATIENT WITH APPOINTMENT.

## 2017-08-16 ENCOUNTER — Other Ambulatory Visit: Payer: Self-pay

## 2017-08-16 DIAGNOSIS — I10 Essential (primary) hypertension: Secondary | ICD-10-CM

## 2017-08-16 MED ORDER — CLONIDINE HCL 0.3 MG PO TABS: 0.3000 mg | ORAL_TABLET | Freq: Two times a day (BID) | ORAL | 3 refills | Status: DC

## 2017-08-16 NOTE — Telephone Encounter (Signed)
cloNIDine (CATAPRES) 0.3 MG tablet, refill request @ [facility] on [address].

## 2017-08-17 ENCOUNTER — Other Ambulatory Visit: Payer: Self-pay | Admitting: *Deleted

## 2017-08-17 DIAGNOSIS — I872 Venous insufficiency (chronic) (peripheral): Secondary | ICD-10-CM

## 2017-08-17 MED ORDER — FUROSEMIDE 20 MG PO TABS
20.0000 mg | ORAL_TABLET | Freq: Every day | ORAL | 1 refills | Status: DC
Start: 2017-08-17 — End: 2017-09-17

## 2017-08-23 ENCOUNTER — Other Ambulatory Visit: Payer: Self-pay

## 2017-08-23 ENCOUNTER — Ambulatory Visit (INDEPENDENT_AMBULATORY_CARE_PROVIDER_SITE_OTHER): Payer: Medicare Other | Admitting: Internal Medicine

## 2017-08-23 ENCOUNTER — Encounter: Payer: Self-pay | Admitting: Internal Medicine

## 2017-08-23 VITALS — BP 189/86 | HR 89 | Temp 98.0°F | Ht <= 58 in | Wt 176.2 lb

## 2017-08-23 DIAGNOSIS — L97929 Non-pressure chronic ulcer of unspecified part of left lower leg with unspecified severity: Secondary | ICD-10-CM | POA: Insufficient documentation

## 2017-08-23 DIAGNOSIS — I10 Essential (primary) hypertension: Secondary | ICD-10-CM

## 2017-08-23 DIAGNOSIS — Z79899 Other long term (current) drug therapy: Secondary | ICD-10-CM

## 2017-08-23 DIAGNOSIS — L97829 Non-pressure chronic ulcer of other part of left lower leg with unspecified severity: Secondary | ICD-10-CM | POA: Diagnosis not present

## 2017-08-23 DIAGNOSIS — I872 Venous insufficiency (chronic) (peripheral): Secondary | ICD-10-CM

## 2017-08-23 NOTE — Assessment & Plan Note (Signed)
Patient presents for HTN follow-up.  She was fasting 2 weeks ago which time her HCTZ was discontinued she was started on Lasix 20 daily.  She is currently on amlodipine 10, clonidine 0.3 BID, Coreg 25 BID, and Lasix 20 and reports compliance.  This BP today 195/85.  States she did not take her blood pressure medications today as she usually takes them after she eats breakfast.  Patient counseled on importance of taking her medications on daily basis before leaving home.  We will not make adjustments to medications today.  - Continue current regimen - BMP  - Patient advised to keep appointment with PCP for HTN follow up

## 2017-08-23 NOTE — Assessment & Plan Note (Signed)
Patient presents for L leg ulcer follow up. States wound has been healing well and denies systemic symptoms. Has an appointment at wound center on 2/18. On exam, there is a 1x1 cm ulcer on lateral L Leg that appears to be healing well and without signs of infection. There is significant LE edema bilaterally that is stable and secondary to chronic venous insufficiency. Recommend patient to continue sulfadiazine cream and to cancel appointment at wound care center as her wound is superficial and is healing well.  Discussed compression stockings to help with lower extremity edema as well as return precautions.   - Continue sulfadiazine cream - Compression stockings - Follow-up as needed - Return precautions discussed

## 2017-08-23 NOTE — Patient Instructions (Addendum)
Amanda Roman,   Please use the compression stockings during the day. This will help with your swelling and with the healing of your ulcer. You do not need to wear them at night, only during the day.   You can cancel your appointment at the wound care center as your ulcer is healing very well. If you notice it worsens give [place] a call and schedule an appointment so that we can evaluate you.   Continue taking all of your medications. Remember to take all your blood pressure medicines before leaving the house every day. This is very very important.   I will call you if the results of your blood work are not normal.   Please call us if you have any questions.
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
## 2017-08-24 LAB — BMP8+ANION GAP
Anion Gap: 18 mmol/L (ref 10.0–18.0)
BUN / CREAT RATIO: 10 — AB (ref 12–28)
BUN: 6 mg/dL — ABNORMAL LOW (ref 8–27)
CO2: 23 mmol/L (ref 20–29)
Calcium: 10.4 mg/dL — ABNORMAL HIGH (ref 8.7–10.3)
Chloride: 102 mmol/L (ref 96–106)
Creatinine, Ser: 0.58 mg/dL (ref 0.57–1.00)
GFR, EST AFRICAN AMERICAN: 102 mL/min/{1.73_m2} (ref >59)
GFR, EST NON AFRICAN AMERICAN: 89 mL/min/{1.73_m2} (ref >59)
Glucose: 99 mg/dL (ref 65–99)
Potassium: 3.5 mmol/L (ref 3.5–5.2)
SODIUM: 143 mmol/L (ref 134–144)

## 2017-08-24 NOTE — Addendum Note (Signed)
Addended by: Lalla Brothers T on: 08/24/2017 11:41 AM   Modules accepted: Level of Service

## 2017-08-24 NOTE — Progress Notes (Signed)
Internal Medicine Clinic Attending  I saw and evaluated the patient.  I personally confirmed the key portions of the history and exam documented by Dr. Santos-Sanchez and I reviewed pertinent patient test results.  The assessment, diagnosis, and plan were formulated together and I agree with the documentation in the resident's note. 

## 2017-08-27 ENCOUNTER — Encounter (HOSPITAL_BASED_OUTPATIENT_CLINIC_OR_DEPARTMENT_OTHER): Payer: Medicare Other

## 2017-08-27 NOTE — Addendum Note (Signed)
Addended by: Hulan Fray on: 08/27/2017 06:57 PM   Modules accepted: Orders

## 2017-09-13 ENCOUNTER — Other Ambulatory Visit: Payer: Self-pay | Admitting: Internal Medicine

## 2017-09-13 DIAGNOSIS — I872 Venous insufficiency (chronic) (peripheral): Secondary | ICD-10-CM

## 2017-09-13 NOTE — Telephone Encounter (Signed)
Patient is requesting refills on furosemide, she been calling since last Friday

## 2017-09-14 NOTE — Telephone Encounter (Signed)
Patient called in requesting refill on lasix. Lasix 20 mg #90 with one refill sent to [facility] on 08/17/2017. Patient will call [facility] and will call us back if there is any difficulty. Hubbard Hartshorn, RN, BSN

## 2017-09-17 ENCOUNTER — Telehealth: Payer: Self-pay | Admitting: *Deleted

## 2017-09-17 MED ORDER — FUROSEMIDE 20 MG PO TABS: 20.0000 mg | ORAL_TABLET | Freq: Every day | ORAL | 3 refills | Status: DC

## 2017-09-17 NOTE — Telephone Encounter (Signed)
Pt's caregiver/ sister calls and states she cannot pick up pt's furosemide, doesn't understand why, nurse called pharmacy [facility] [address], [facility], pharmacist states pt profile from insurance states pt has recently picked up med from another pharm possibly but the code is for one that has gone to another pharm, sister states she has not gotten med anywhere else. Spoke to pharmacist and he is Administrator co, spoke to dr Eppie Gibson and he states ok for dr kim to assist in pt getting temporary supply.

## 2017-09-17 NOTE — Telephone Encounter (Signed)
furosemide (LASIX) 20 MG tablet, REFILL REQUEST @ [facility] [address].

## 2017-09-17 NOTE — Telephone Encounter (Signed)
[facility] changed to [facility] [facility]. Called/ informed pt rx sent to [facility] - stated ok.

## 2017-09-17 NOTE — Telephone Encounter (Signed)
Her Pharmacy was [facility] [address], not [facility] on [address].  I therefore refilled the medication for [facility] instead of [facility].  Please call the patient to confirm [facility].  If I need to change her pharmacy to [facility] I will need to be informed.  Thanks.

## 2017-10-12 ENCOUNTER — Ambulatory Visit (INDEPENDENT_AMBULATORY_CARE_PROVIDER_SITE_OTHER): Payer: Medicare Other | Admitting: Internal Medicine

## 2017-10-12 ENCOUNTER — Other Ambulatory Visit: Payer: Self-pay

## 2017-10-12 ENCOUNTER — Encounter: Payer: Self-pay | Admitting: Internal Medicine

## 2017-10-12 VITALS — BP 139/71 | HR 57 | Temp 97.6°F | Wt 173.3 lb

## 2017-10-12 DIAGNOSIS — Z79899 Other long term (current) drug therapy: Secondary | ICD-10-CM

## 2017-10-12 DIAGNOSIS — Z6841 Body Mass Index (BMI) 40.0 and over, adult: Secondary | ICD-10-CM

## 2017-10-12 DIAGNOSIS — R011 Cardiac murmur, unspecified: Secondary | ICD-10-CM | POA: Diagnosis not present

## 2017-10-12 DIAGNOSIS — I10 Essential (primary) hypertension: Secondary | ICD-10-CM | POA: Diagnosis not present

## 2017-10-12 DIAGNOSIS — I872 Venous insufficiency (chronic) (peripheral): Secondary | ICD-10-CM

## 2017-10-12 DIAGNOSIS — I83023 Varicose veins of left lower extremity with ulcer of ankle: Secondary | ICD-10-CM | POA: Diagnosis not present

## 2017-10-12 DIAGNOSIS — L97329 Non-pressure chronic ulcer of left ankle with unspecified severity: Secondary | ICD-10-CM

## 2017-10-12 DIAGNOSIS — L97921 Non-pressure chronic ulcer of unspecified part of left lower leg limited to breakdown of skin: Secondary | ICD-10-CM

## 2017-10-12 DIAGNOSIS — R008 Other abnormalities of heart beat: Secondary | ICD-10-CM

## 2017-10-12 DIAGNOSIS — M159 Polyosteoarthritis, unspecified: Secondary | ICD-10-CM

## 2017-10-12 DIAGNOSIS — M150 Primary generalized (osteo)arthritis: Secondary | ICD-10-CM

## 2017-10-12 DIAGNOSIS — M8580 Other specified disorders of bone density and structure, unspecified site: Secondary | ICD-10-CM

## 2017-10-12 MED ORDER — CAPSAICIN 0.025 % EX CREA: TOPICAL_CREAM | Freq: Two times a day (BID) | CUTANEOUS | 0 refills | Status: DC

## 2017-10-12 MED ORDER — SILVER SULFADIAZINE 1 % EX CREA: TOPICAL_CREAM | CUTANEOUS | 0 refills | Status: DC

## 2017-10-12 MED ORDER — FUROSEMIDE 40 MG PO TABS: 40.0000 mg | ORAL_TABLET | Freq: Every day | ORAL | 3 refills | Status: DC

## 2017-10-12 NOTE — Assessment & Plan Note (Signed)
Assessment  She continues to have the left lateral leg ulcer near the lateral malleolus. This is superficial and appears unchanged from the most recent photograph placed in the chart. There is no erythema or drainage. Undoubtedly, this is a venous stasis ulcer. She states that since changing from hydrochlorothiazide to Lasix she has noted some decrease in the swelling of her legs bilaterally. The swelling on the left has always been greater than the right.  Plan  We will continue local wound care with Silvadene cream. We will also work on her chronic venous insufficiency by increasing her Lasix to 40 mg by mouth every morning. We will reassess healing of the ulcer and improvement in the chronic venous insufficiency at the follow-up visit.

## 2017-10-12 NOTE — Assessment & Plan Note (Signed)
Assessment  Her blood pressure is well controlled today at 139/71.  This is on amlodipine 10 mg by mouth daily, carvedilol 25 mg by mouth twice daily, and clonidine 0.3 mg by mouth twice daily.  Plan  We will continue the amlodipine, carvedilol, and clonidine at the current doses and reassess her blood pressure at the follow-up visit.

## 2017-10-12 NOTE — Patient Instructions (Addendum)
It was great to see you again.  You are still doing a good job with your weight having lost another 3 pounds.  1) Keep eating your greens.  They are healthy and helping with weight loss.  This will take pressure off of your leg.  2) Keep taking your medications as you are.  3) We drew blood from you today.  I will call you with the results if there is anything we need to worry about.  4) We will talk about the bone scan next time to see if you may want to check your bone strength.  5) I will look into personal care services.  I am not sure what happened to the paperwork we filled out last time.  I will see you back in 6 months, sooner if necessary.

## 2017-10-12 NOTE — Assessment & Plan Note (Signed)
Assessment  The patient states subjectively there's been improvement in her bilateral chronic venous insufficiency with the switch from hydrochlorothiazide to Lasix. That said, there is still considerable swelling bilaterally, left greater than right. Given the presence of an ulcer in the left lower extremity, improvement in this swelling from the chronic venous insufficiency will be important.  Plan  We have increased the Lasix from 20 mg to 40 mg every morning. A basic metabolic panel has been drawn is pending at the time of this dictation. I do not believe she is able to put on compression stockings at this point. We will therefore reassess the efficacy of this medical therapy in improving her chronic venous insufficiency and hopefully improving healing of the left lower extremity wound when she is seen at the follow-up visit.

## 2017-10-12 NOTE — Assessment & Plan Note (Signed)
Other than the DEXA scan, she is up-to-date on her health care maintenance.
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
## 2017-10-12 NOTE — Assessment & Plan Note (Signed)
Assessment  She has no signs or symptoms of a pathologic fracture related to her osteopenia. It has been 2 years since her last DEXA scan. We discussed repeating the DEXA scan to assess the status of her osteopenia. Rather than placing the order, she preferred that she consider it over the next couple of months and we discussed the issue again at the follow-up visit.  Plan  At the follow-up visit we will rediscuss the issue of repeating the DEXA scan to assess the stability of her osteopenia.

## 2017-10-12 NOTE — Assessment & Plan Note (Signed)
Assessment  She has taken the heart some of the dietary changes that we discussed at the last visit and has lost an additional 3 pounds. She states this is due to changing from more starchy vegetables to more greens. She was praised for her efforts in this area.  Plan  She was encouraged to continue to work on her diet in order to lose even more weight. We will reassess the success of further weight loss with dietary changes at the follow-up visit.

## 2017-10-12 NOTE — Assessment & Plan Note (Signed)
Assessment  She continues to have occasional left knee pain associated with ambulation. In the past this has responded to Capsaician cream. She is interested in restarting this medication. I also stressed the importance of further weight loss in order to take the pressure off of the knee.  Plan  She was given a prescription for Capsacian cream 0.025% to be placed over the left knee on an as-needed basis every 12 hours. She will also continue to work on her weight loss. We will reassess the efficacy of these interventions at the follow-up visit.

## 2017-10-13 LAB — BMP8+ANION GAP
ANION GAP: 14 mmol/L (ref 10.0–18.0)
BUN/Creatinine Ratio: 14 (ref 12–28)
BUN: 10 mg/dL (ref 8–27)
CHLORIDE: 99 mmol/L (ref 96–106)
CO2: 27 mmol/L (ref 20–29)
Calcium: 10 mg/dL (ref 8.7–10.3)
Creatinine, Ser: 0.71 mg/dL (ref 0.57–1.00)
GFR, EST AFRICAN AMERICAN: 94 mL/min/{1.73_m2} (ref >59)
GFR, EST NON AFRICAN AMERICAN: 82 mL/min/{1.73_m2} (ref >59)
GLUCOSE: 107 mg/dL — AB (ref 65–99)
POTASSIUM: 3.9 mmol/L (ref 3.5–5.2)
Sodium: 140 mmol/L (ref 134–144)

## 2017-10-16 NOTE — Progress Notes (Signed)
Patient ID: Amanda Roman, female   DOB: 1941-06-26, 77 y.o.   MRN: [id number]  BMP: K 3.9, Cr 0.71, eGFR 94.  No concerns.  Will continue current plan.  Amanda Roman asked that I call if there were any abnormalities we needed to address.  With these results I did not call her as everything looked very good.

## 2017-10-19 ENCOUNTER — Inpatient Hospital Stay (HOSPITAL_COMMUNITY)
Admission: EM | Admit: 2017-10-19 | Discharge: 2017-10-26 | DRG: 330 | Disposition: A | Discharge status: Skilled Nursing Facility | Payer: Medicare Other | Attending: Internal Medicine | Admitting: Internal Medicine

## 2017-10-19 ENCOUNTER — Other Ambulatory Visit: Payer: Self-pay

## 2017-10-19 ENCOUNTER — Emergency Department (HOSPITAL_COMMUNITY): Payer: Medicare Other

## 2017-10-19 DIAGNOSIS — K436 Other and unspecified ventral hernia with obstruction, without gangrene: Secondary | ICD-10-CM | POA: Diagnosis not present

## 2017-10-19 DIAGNOSIS — Z87891 Personal history of nicotine dependence: Secondary | ICD-10-CM

## 2017-10-19 DIAGNOSIS — I10 Essential (primary) hypertension: Secondary | ICD-10-CM | POA: Diagnosis not present

## 2017-10-19 DIAGNOSIS — Z880 Allergy status to penicillin: Secondary | ICD-10-CM | POA: Diagnosis not present

## 2017-10-19 DIAGNOSIS — E876 Hypokalemia: Secondary | ICD-10-CM | POA: Diagnosis present

## 2017-10-19 DIAGNOSIS — I739 Peripheral vascular disease, unspecified: Secondary | ICD-10-CM | POA: Diagnosis present

## 2017-10-19 DIAGNOSIS — K219 Gastro-esophageal reflux disease without esophagitis: Secondary | ICD-10-CM | POA: Diagnosis present

## 2017-10-19 DIAGNOSIS — Z888 Allergy status to other drugs, medicaments and biological substances status: Secondary | ICD-10-CM

## 2017-10-19 DIAGNOSIS — K567 Ileus, unspecified: Secondary | ICD-10-CM | POA: Diagnosis not present

## 2017-10-19 DIAGNOSIS — Z66 Do not resuscitate: Secondary | ICD-10-CM | POA: Diagnosis present

## 2017-10-19 DIAGNOSIS — Z7982 Long term (current) use of aspirin: Secondary | ICD-10-CM | POA: Diagnosis not present

## 2017-10-19 DIAGNOSIS — Z978 Presence of other specified devices: Secondary | ICD-10-CM | POA: Diagnosis not present

## 2017-10-19 DIAGNOSIS — K56600 Partial intestinal obstruction, unspecified as to cause: Secondary | ICD-10-CM | POA: Diagnosis present

## 2017-10-19 DIAGNOSIS — I872 Venous insufficiency (chronic) (peripheral): Secondary | ICD-10-CM | POA: Diagnosis not present

## 2017-10-19 DIAGNOSIS — R112 Nausea with vomiting, unspecified: Secondary | ICD-10-CM | POA: Diagnosis not present

## 2017-10-19 DIAGNOSIS — K439 Ventral hernia without obstruction or gangrene: Secondary | ICD-10-CM

## 2017-10-19 DIAGNOSIS — E669 Obesity, unspecified: Secondary | ICD-10-CM | POA: Diagnosis not present

## 2017-10-19 DIAGNOSIS — K2970 Gastritis, unspecified, without bleeding: Secondary | ICD-10-CM | POA: Diagnosis not present

## 2017-10-19 DIAGNOSIS — Z885 Allergy status to narcotic agent status: Secondary | ICD-10-CM | POA: Diagnosis not present

## 2017-10-19 DIAGNOSIS — Z6841 Body Mass Index (BMI) 40.0 and over, adult: Secondary | ICD-10-CM

## 2017-10-19 DIAGNOSIS — K430 Incisional hernia with obstruction, without gangrene: Secondary | ICD-10-CM | POA: Diagnosis not present

## 2017-10-19 DIAGNOSIS — E785 Hyperlipidemia, unspecified: Secondary | ICD-10-CM | POA: Diagnosis present

## 2017-10-19 DIAGNOSIS — R1110 Vomiting, unspecified: Secondary | ICD-10-CM | POA: Diagnosis not present

## 2017-10-19 DIAGNOSIS — K450 Other specified abdominal hernia with obstruction, without gangrene: Secondary | ICD-10-CM | POA: Diagnosis present

## 2017-10-19 DIAGNOSIS — Z79899 Other long term (current) drug therapy: Secondary | ICD-10-CM | POA: Diagnosis not present

## 2017-10-19 DIAGNOSIS — E860 Dehydration: Secondary | ICD-10-CM | POA: Diagnosis not present

## 2017-10-19 DIAGNOSIS — R011 Cardiac murmur, unspecified: Secondary | ICD-10-CM | POA: Diagnosis not present

## 2017-10-19 DIAGNOSIS — M1990 Unspecified osteoarthritis, unspecified site: Secondary | ICD-10-CM | POA: Diagnosis present

## 2017-10-19 DIAGNOSIS — Z4682 Encounter for fitting and adjustment of non-vascular catheter: Secondary | ICD-10-CM | POA: Diagnosis not present

## 2017-10-19 DIAGNOSIS — Z452 Encounter for adjustment and management of vascular access device: Secondary | ICD-10-CM

## 2017-10-19 DIAGNOSIS — M8580 Other specified disorders of bone density and structure, unspecified site: Secondary | ICD-10-CM | POA: Diagnosis present

## 2017-10-19 LAB — CBC WITH DIFFERENTIAL/PLATELET
BASOS ABS: 0 10*3/uL (ref 0.0–0.1)
Basophils Relative: 1 %
EOS PCT: 2 %
Eosinophils Absolute: 0.1 10*3/uL (ref 0.0–0.7)
HEMATOCRIT: 40.6 % (ref 36.0–46.0)
Hemoglobin: 13.8 g/dL (ref 12.0–15.0)
LYMPHS ABS: 1.4 10*3/uL (ref 0.7–4.0)
LYMPHS PCT: 21 %
MCH: 30.7 pg (ref 26.0–34.0)
MCHC: 34 g/dL (ref 30.0–36.0)
MCV: 90.2 fL (ref 78.0–100.0)
MONO ABS: 0.2 10*3/uL (ref 0.1–1.0)
MONOS PCT: 3 %
NEUTROS ABS: 4.7 10*3/uL (ref 1.7–7.7)
Neutrophils Relative %: 73 %
PLATELETS: 215 10*3/uL (ref 150–400)
RBC: 4.5 MIL/uL (ref 3.87–5.11)
RDW: 12.6 % (ref 11.5–15.5)
WBC: 6.4 10*3/uL (ref 4.0–10.5)

## 2017-10-19 LAB — I-STAT CHEM 8, ED
BUN: 7 mg/dL (ref 6–20)
CALCIUM ION: 1.09 mmol/L — AB (ref 1.15–1.40)
CHLORIDE: 100 mmol/L — AB (ref 101–111)
Creatinine, Ser: 0.6 mg/dL (ref 0.44–1.00)
Glucose, Bld: 136 mg/dL — ABNORMAL HIGH (ref 65–99)
HCT: 44 % (ref 36.0–46.0)
Hemoglobin: 15 g/dL (ref 12.0–15.0)
Potassium: 3.7 mmol/L (ref 3.5–5.1)
SODIUM: 139 mmol/L (ref 135–145)
TCO2: 31 mmol/L (ref 22–32)

## 2017-10-19 LAB — COMPREHENSIVE METABOLIC PANEL
ALT: 23 U/L (ref 14–54)
AST: 27 U/L (ref 15–41)
Albumin: 4.1 g/dL (ref 3.5–5.0)
Alkaline Phosphatase: 73 U/L (ref 38–126)
Anion gap: 10 (ref 5–15)
BILIRUBIN TOTAL: 0.8 mg/dL (ref 0.3–1.2)
BUN: 6 mg/dL (ref 6–20)
CHLORIDE: 100 mmol/L — AB (ref 101–111)
CO2: 27 mmol/L (ref 22–32)
CREATININE: 0.6 mg/dL (ref 0.44–1.00)
Calcium: 9.5 mg/dL (ref 8.9–10.3)
Glucose, Bld: 134 mg/dL — ABNORMAL HIGH (ref 65–99)
POTASSIUM: 3.8 mmol/L (ref 3.5–5.1)
Sodium: 137 mmol/L (ref 135–145)
TOTAL PROTEIN: 8.3 g/dL — AB (ref 6.5–8.1)

## 2017-10-19 LAB — URINALYSIS, ROUTINE W REFLEX MICROSCOPIC
Bilirubin Urine: NEGATIVE
Glucose, UA: NEGATIVE mg/dL
Hgb urine dipstick: NEGATIVE
Ketones, ur: NEGATIVE mg/dL
Leukocytes, UA: NEGATIVE
NITRITE: NEGATIVE
Protein, ur: NEGATIVE mg/dL
SPECIFIC GRAVITY, URINE: 1.008 (ref 1.005–1.030)
pH: 9 — ABNORMAL HIGH (ref 5.0–8.0)

## 2017-10-19 LAB — LIPASE, BLOOD: Lipase: 32 U/L (ref 11–51)

## 2017-10-19 LAB — I-STAT TROPONIN, ED: Troponin i, poc: 0.01 ng/mL (ref 0.00–0.08)

## 2017-10-19 LAB — I-STAT CG4 LACTIC ACID, ED: LACTIC ACID, VENOUS: 1.56 mmol/L (ref 0.5–1.9)

## 2017-10-19 MED ORDER — SODIUM CHLORIDE 0.9 % IV BOLUS
1000.0000 mL | Freq: Once | INTRAVENOUS | Status: AC
  Administered 2017-10-19: 1000 mL via INTRAVENOUS

## 2017-10-19 MED ORDER — ONDANSETRON HCL 4 MG/2ML IJ SOLN
4.0000 mg | Freq: Once | INTRAMUSCULAR | Status: AC
  Administered 2017-10-19: 4 mg via INTRAVENOUS
  Filled 2017-10-19: qty 2

## 2017-10-19 MED ORDER — FENTANYL CITRATE (PF) 100 MCG/2ML IJ SOLN
50.0000 ug | Freq: Once | INTRAMUSCULAR | Status: AC
  Administered 2017-10-19: 50 ug via INTRAVENOUS
  Filled 2017-10-19: qty 2

## 2017-10-19 MED ORDER — SODIUM CHLORIDE 0.9 % IV SOLN
Freq: Once | INTRAVENOUS | Status: AC
  Administered 2017-10-20: via INTRAVENOUS

## 2017-10-19 MED ORDER — IOPAMIDOL (ISOVUE-300) INJECTION 61%
INTRAVENOUS | Status: AC
  Filled 2017-10-19: qty 100

## 2017-10-19 MED ORDER — IOPAMIDOL (ISOVUE-300) INJECTION 61%
100.0000 mL | Freq: Once | INTRAVENOUS | Status: AC | PRN
  Administered 2017-10-19: 100 mL via INTRAVENOUS

## 2017-10-19 NOTE — ED Triage Notes (Signed)
Per ems pt was at home and and ate [employer] last night and again today and she stated she got sick. Both nausea and vomitting. Started today at 12:30. 4mg  IM zofran, 174/81, 60 hr, 100% ra

## 2017-10-19 NOTE — ED Notes (Signed)
Patient transported to CT 
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
## 2017-10-19 NOTE — ED Notes (Addendum)
Bladder Scan: 345 mL. MD and RN notified.

## 2017-10-20 ENCOUNTER — Emergency Department (HOSPITAL_COMMUNITY): Payer: Medicare Other

## 2017-10-20 ENCOUNTER — Inpatient Hospital Stay (HOSPITAL_COMMUNITY): Payer: Medicare Other | Admitting: Certified Registered"

## 2017-10-20 ENCOUNTER — Inpatient Hospital Stay (HOSPITAL_COMMUNITY): Payer: Medicare Other

## 2017-10-20 ENCOUNTER — Encounter (HOSPITAL_COMMUNITY): Payer: Self-pay | Admitting: Certified Registered"

## 2017-10-20 ENCOUNTER — Encounter (HOSPITAL_COMMUNITY)
Admission: EM | Disposition: A | Discharge status: Skilled Nursing Facility | Payer: Self-pay | Source: Home / Self Care | Attending: Internal Medicine

## 2017-10-20 DIAGNOSIS — K56600 Partial intestinal obstruction, unspecified as to cause: Secondary | ICD-10-CM | POA: Diagnosis not present

## 2017-10-20 DIAGNOSIS — R011 Cardiac murmur, unspecified: Secondary | ICD-10-CM | POA: Diagnosis not present

## 2017-10-20 DIAGNOSIS — I10 Essential (primary) hypertension: Secondary | ICD-10-CM

## 2017-10-20 DIAGNOSIS — L97929 Non-pressure chronic ulcer of unspecified part of left lower leg with unspecified severity: Secondary | ICD-10-CM | POA: Diagnosis not present

## 2017-10-20 DIAGNOSIS — I872 Venous insufficiency (chronic) (peripheral): Secondary | ICD-10-CM

## 2017-10-20 DIAGNOSIS — R262 Difficulty in walking, not elsewhere classified: Secondary | ICD-10-CM | POA: Diagnosis not present

## 2017-10-20 DIAGNOSIS — Z885 Allergy status to narcotic agent status: Secondary | ICD-10-CM | POA: Diagnosis not present

## 2017-10-20 DIAGNOSIS — K649 Unspecified hemorrhoids: Secondary | ICD-10-CM | POA: Diagnosis not present

## 2017-10-20 DIAGNOSIS — Z8249 Family history of ischemic heart disease and other diseases of the circulatory system: Secondary | ICD-10-CM

## 2017-10-20 DIAGNOSIS — E669 Obesity, unspecified: Secondary | ICD-10-CM | POA: Diagnosis not present

## 2017-10-20 DIAGNOSIS — I739 Peripheral vascular disease, unspecified: Secondary | ICD-10-CM | POA: Diagnosis not present

## 2017-10-20 DIAGNOSIS — Z888 Allergy status to other drugs, medicaments and biological substances status: Secondary | ICD-10-CM

## 2017-10-20 DIAGNOSIS — K436 Other and unspecified ventral hernia with obstruction, without gangrene: Secondary | ICD-10-CM | POA: Diagnosis not present

## 2017-10-20 DIAGNOSIS — Z4682 Encounter for fitting and adjustment of non-vascular catheter: Secondary | ICD-10-CM | POA: Diagnosis not present

## 2017-10-20 DIAGNOSIS — M1990 Unspecified osteoarthritis, unspecified site: Secondary | ICD-10-CM | POA: Diagnosis not present

## 2017-10-20 DIAGNOSIS — E860 Dehydration: Secondary | ICD-10-CM | POA: Diagnosis present

## 2017-10-20 DIAGNOSIS — M6281 Muscle weakness (generalized): Secondary | ICD-10-CM | POA: Diagnosis not present

## 2017-10-20 DIAGNOSIS — M8580 Other specified disorders of bone density and structure, unspecified site: Secondary | ICD-10-CM | POA: Diagnosis present

## 2017-10-20 DIAGNOSIS — Z87891 Personal history of nicotine dependence: Secondary | ICD-10-CM | POA: Diagnosis not present

## 2017-10-20 DIAGNOSIS — Z66 Do not resuscitate: Secondary | ICD-10-CM | POA: Diagnosis present

## 2017-10-20 DIAGNOSIS — S3092XA Unspecified superficial injury of abdominal wall, initial encounter: Secondary | ICD-10-CM | POA: Diagnosis not present

## 2017-10-20 DIAGNOSIS — G8911 Acute pain due to trauma: Secondary | ICD-10-CM | POA: Diagnosis not present

## 2017-10-20 DIAGNOSIS — Z79899 Other long term (current) drug therapy: Secondary | ICD-10-CM

## 2017-10-20 DIAGNOSIS — E876 Hypokalemia: Secondary | ICD-10-CM | POA: Diagnosis not present

## 2017-10-20 DIAGNOSIS — K219 Gastro-esophageal reflux disease without esophagitis: Secondary | ICD-10-CM

## 2017-10-20 DIAGNOSIS — Z452 Encounter for adjustment and management of vascular access device: Secondary | ICD-10-CM | POA: Diagnosis not present

## 2017-10-20 DIAGNOSIS — Z6841 Body Mass Index (BMI) 40.0 and over, adult: Secondary | ICD-10-CM | POA: Diagnosis not present

## 2017-10-20 DIAGNOSIS — K559 Vascular disorder of intestine, unspecified: Secondary | ICD-10-CM | POA: Diagnosis not present

## 2017-10-20 DIAGNOSIS — Z978 Presence of other specified devices: Secondary | ICD-10-CM

## 2017-10-20 DIAGNOSIS — E785 Hyperlipidemia, unspecified: Secondary | ICD-10-CM | POA: Diagnosis not present

## 2017-10-20 DIAGNOSIS — K567 Ileus, unspecified: Secondary | ICD-10-CM | POA: Diagnosis not present

## 2017-10-20 DIAGNOSIS — Z880 Allergy status to penicillin: Secondary | ICD-10-CM

## 2017-10-20 DIAGNOSIS — Z7982 Long term (current) use of aspirin: Secondary | ICD-10-CM | POA: Diagnosis not present

## 2017-10-20 DIAGNOSIS — R609 Edema, unspecified: Secondary | ICD-10-CM | POA: Diagnosis not present

## 2017-10-20 DIAGNOSIS — K450 Other specified abdominal hernia with obstruction, without gangrene: Secondary | ICD-10-CM | POA: Diagnosis not present

## 2017-10-20 DIAGNOSIS — K430 Incisional hernia with obstruction, without gangrene: Secondary | ICD-10-CM | POA: Diagnosis not present

## 2017-10-20 HISTORY — PX: VENTRAL HERNIA REPAIR: SHX424

## 2017-10-20 HISTORY — PX: BOWEL RESECTION: SHX1257

## 2017-10-20 LAB — BASIC METABOLIC PANEL
ANION GAP: 12 (ref 5–15)
BUN: 5 mg/dL — AB (ref 6–20)
CALCIUM: 9.2 mg/dL (ref 8.9–10.3)
CO2: 25 mmol/L (ref 22–32)
Chloride: 98 mmol/L — ABNORMAL LOW (ref 101–111)
Creatinine, Ser: 0.51 mg/dL (ref 0.44–1.00)
GFR calc Af Amer: >60 mL/min (ref >60)
GLUCOSE: 110 mg/dL — AB (ref 65–99)
Potassium: 5.1 mmol/L (ref 3.5–5.1)
SODIUM: 135 mmol/L (ref 135–145)

## 2017-10-20 LAB — SURGICAL PCR SCREEN
MRSA, PCR: NEGATIVE
Staphylococcus aureus: POSITIVE — AB

## 2017-10-20 LAB — GLUCOSE, CAPILLARY
Glucose-Capillary: 113 mg/dL — ABNORMAL HIGH (ref 65–99)
Glucose-Capillary: 91 mg/dL (ref 65–99)

## 2017-10-20 SURGERY — REPAIR, HERNIA, VENTRAL
Anesthesia: General | Site: Abdomen

## 2017-10-20 MED ORDER — FENTANYL 40 MCG/ML IV SOLN
INTRAVENOUS | Status: DC
  Administered 2017-10-20: 1000 ug via INTRAVENOUS
  Administered 2017-10-20: 30 ug via INTRAVENOUS
  Administered 2017-10-20: 20 ug via INTRAVENOUS
  Administered 2017-10-21: 50 ug via INTRAVENOUS
  Administered 2017-10-21: 40 ug via INTRAVENOUS
  Administered 2017-10-21: 140 ug via INTRAVENOUS
  Administered 2017-10-21: 100 ug via INTRAVENOUS
  Administered 2017-10-21: 50 ug via INTRAVENOUS
  Administered 2017-10-22: 10 ug via INTRAVENOUS

## 2017-10-20 MED ORDER — DIPHENHYDRAMINE HCL 12.5 MG/5ML PO ELIX: 12.5000 mg | ORAL_SOLUTION | Freq: Four times a day (QID) | ORAL | Status: DC | PRN

## 2017-10-20 MED ORDER — LACTATED RINGERS IV SOLN
INTRAVENOUS | Status: DC
  Administered 2017-10-20: 11:00:00 via INTRAVENOUS

## 2017-10-20 MED ORDER — HYDROMORPHONE HCL 1 MG/ML IJ SOLN
INTRAMUSCULAR | Status: AC
  Administered 2017-10-20: 0.5 mg via INTRAVENOUS
  Filled 2017-10-20: qty 1

## 2017-10-20 MED ORDER — PROPOFOL 10 MG/ML IV BOLUS
INTRAVENOUS | Status: DC | PRN
  Administered 2017-10-20: 50 mg via INTRAVENOUS

## 2017-10-20 MED ORDER — MUPIROCIN 2 % EX OINT
1.0000 "application " | TOPICAL_OINTMENT | Freq: Two times a day (BID) | CUTANEOUS | Status: AC
  Administered 2017-10-20 – 2017-10-24 (×10): 1 via NASAL
  Filled 2017-10-20: qty 22

## 2017-10-20 MED ORDER — ONDANSETRON HCL 4 MG PO TABS
4.0000 mg | ORAL_TABLET | Freq: Four times a day (QID) | ORAL | Status: DC | PRN
  Filled 2017-10-20: qty 1

## 2017-10-20 MED ORDER — SODIUM CHLORIDE 0.9% FLUSH: 9.0000 mL | INTRAVENOUS | Status: DC | PRN

## 2017-10-20 MED ORDER — SODIUM CHLORIDE 0.9 % IV SOLN
INTRAVENOUS | Status: DC
  Administered 2017-10-20 – 2017-10-21 (×2): via INTRAVENOUS

## 2017-10-20 MED ORDER — HYDROMORPHONE HCL 1 MG/ML IJ SOLN
0.2500 mg | INTRAMUSCULAR | Status: DC | PRN
  Administered 2017-10-20 (×2): 0.5 mg via INTRAVENOUS

## 2017-10-20 MED ORDER — BUPIVACAINE-EPINEPHRINE (PF) 0.25% -1:200000 IJ SOLN
INTRAMUSCULAR | Status: AC
  Filled 2017-10-20: qty 30

## 2017-10-20 MED ORDER — BUPIVACAINE LIPOSOME 1.3 % IJ SUSP
20.0000 mL | Freq: Once | INTRAMUSCULAR | Status: DC
  Filled 2017-10-20: qty 20

## 2017-10-20 MED ORDER — FENTANYL CITRATE (PF) 100 MCG/2ML IJ SOLN
INTRAMUSCULAR | Status: AC
  Filled 2017-10-20: qty 2

## 2017-10-20 MED ORDER — SODIUM CHLORIDE 0.9 % IV BOLUS
1000.0000 mL | Freq: Once | INTRAVENOUS | Status: AC
  Administered 2017-10-20: 1000 mL via INTRAVENOUS

## 2017-10-20 MED ORDER — NALOXONE HCL 0.4 MG/ML IJ SOLN: 0.4000 mg | INTRAMUSCULAR | Status: DC | PRN

## 2017-10-20 MED ORDER — FENTANYL CITRATE (PF) 250 MCG/5ML IJ SOLN
INTRAMUSCULAR | Status: AC
  Filled 2017-10-20: qty 5

## 2017-10-20 MED ORDER — VANCOMYCIN HCL IN DEXTROSE 1-5 GM/200ML-% IV SOLN
1000.0000 mg | INTRAVENOUS | Status: AC
  Administered 2017-10-20: 1000 mg via INTRAVENOUS
  Filled 2017-10-20 (×2): qty 200

## 2017-10-20 MED ORDER — CHLORHEXIDINE GLUCONATE CLOTH 2 % EX PADS
6.0000 | MEDICATED_PAD | Freq: Every day | CUTANEOUS | Status: AC
  Administered 2017-10-20 – 2017-10-24 (×5): 6 via TOPICAL

## 2017-10-20 MED ORDER — HYDROMORPHONE HCL 2 MG/ML IJ SOLN
0.5000 mg | INTRAMUSCULAR | Status: DC | PRN
  Administered 2017-10-20: 0.5 mg via INTRAVENOUS
  Filled 2017-10-20: qty 1

## 2017-10-20 MED ORDER — SODIUM CHLORIDE 0.9 % IV SOLN
INTRAVENOUS | Status: DC | PRN
  Administered 2017-10-20: 40 mL

## 2017-10-20 MED ORDER — PROPOFOL 10 MG/ML IV BOLUS
INTRAVENOUS | Status: AC
Start: 2017-10-20 — End: 2017-10-20
  Filled 2017-10-20: qty 20

## 2017-10-20 MED ORDER — BUPIVACAINE-EPINEPHRINE (PF) 0.25% -1:200000 IJ SOLN
INTRAMUSCULAR | Status: DC | PRN
  Administered 2017-10-20: 10 mL

## 2017-10-20 MED ORDER — LABETALOL HCL 5 MG/ML IV SOLN
10.0000 mg | INTRAVENOUS | Status: DC | PRN
  Filled 2017-10-20: qty 4

## 2017-10-20 MED ORDER — MIDAZOLAM HCL 2 MG/2ML IJ SOLN
INTRAMUSCULAR | Status: AC
  Filled 2017-10-20: qty 2

## 2017-10-20 MED ORDER — PHENYLEPHRINE HCL 10 MG/ML IJ SOLN
INTRAVENOUS | Status: DC | PRN
  Administered 2017-10-20: 25 ug/min via INTRAVENOUS

## 2017-10-20 MED ORDER — 0.9 % SODIUM CHLORIDE (POUR BTL) OPTIME
TOPICAL | Status: DC | PRN
  Administered 2017-10-20 (×2): 1000 mL

## 2017-10-20 MED ORDER — ONDANSETRON HCL 4 MG/2ML IJ SOLN
4.0000 mg | Freq: Once | INTRAMUSCULAR | Status: AC
  Administered 2017-10-20: 4 mg via INTRAVENOUS
  Filled 2017-10-20 (×2): qty 2

## 2017-10-20 MED ORDER — GLYCOPYRROLATE 0.2 MG/ML IJ SOLN
INTRAMUSCULAR | Status: DC | PRN
  Administered 2017-10-20: .1 mg via INTRAVENOUS

## 2017-10-20 MED ORDER — ONDANSETRON HCL 4 MG/2ML IJ SOLN
4.0000 mg | Freq: Four times a day (QID) | INTRAMUSCULAR | Status: DC | PRN
  Administered 2017-10-22 (×2): 4 mg via INTRAVENOUS
  Filled 2017-10-20 (×3): qty 2

## 2017-10-20 MED ORDER — FENTANYL CITRATE (PF) 100 MCG/2ML IJ SOLN
INTRAMUSCULAR | Status: DC | PRN
  Administered 2017-10-20: 50 ug via INTRAVENOUS
  Administered 2017-10-20: 25 ug via INTRAVENOUS
  Administered 2017-10-20: 50 ug via INTRAVENOUS
  Administered 2017-10-20: 25 ug via INTRAVENOUS

## 2017-10-20 MED ORDER — ONDANSETRON HCL 4 MG/2ML IJ SOLN
INTRAMUSCULAR | Status: DC | PRN
  Administered 2017-10-20: 4 mg via INTRAVENOUS

## 2017-10-20 MED ORDER — SUCCINYLCHOLINE CHLORIDE 20 MG/ML IJ SOLN
INTRAMUSCULAR | Status: DC | PRN
  Administered 2017-10-20: 100 mg via INTRAVENOUS

## 2017-10-20 MED ORDER — SUGAMMADEX SODIUM 200 MG/2ML IV SOLN
INTRAVENOUS | Status: DC | PRN
  Administered 2017-10-20: 150.2 mg via INTRAVENOUS

## 2017-10-20 MED ORDER — DEXAMETHASONE SODIUM PHOSPHATE 10 MG/ML IJ SOLN
INTRAMUSCULAR | Status: DC | PRN
  Administered 2017-10-20: 4 mg via INTRAVENOUS

## 2017-10-20 MED ORDER — LACTATED RINGERS IV SOLN
INTRAVENOUS | Status: DC | PRN
  Administered 2017-10-20: 12:00:00 via INTRAVENOUS

## 2017-10-20 MED ORDER — ENOXAPARIN SODIUM 40 MG/0.4ML ~~LOC~~ SOLN: 40.0000 mg | SUBCUTANEOUS | Status: DC

## 2017-10-20 MED ORDER — HYDRALAZINE HCL 20 MG/ML IJ SOLN
5.0000 mg | Freq: Three times a day (TID) | INTRAMUSCULAR | Status: DC | PRN
  Administered 2017-10-21 – 2017-10-23 (×3): 5 mg via INTRAVENOUS
  Filled 2017-10-20 (×5): qty 1

## 2017-10-20 MED ORDER — ONDANSETRON HCL 4 MG/2ML IJ SOLN
4.0000 mg | Freq: Four times a day (QID) | INTRAMUSCULAR | Status: DC | PRN
  Filled 2017-10-20: qty 2

## 2017-10-20 MED ORDER — HYDROMORPHONE HCL 1 MG/ML IJ SOLN
0.5000 mg | INTRAMUSCULAR | Status: DC | PRN
  Administered 2017-10-20: 0.5 mg via INTRAVENOUS
  Filled 2017-10-20: qty 1

## 2017-10-20 MED ORDER — ROCURONIUM BROMIDE 100 MG/10ML IV SOLN
INTRAVENOUS | Status: DC | PRN
  Administered 2017-10-20: 40 mg via INTRAVENOUS

## 2017-10-20 MED ORDER — DIPHENHYDRAMINE HCL 50 MG/ML IJ SOLN: 12.5000 mg | Freq: Four times a day (QID) | INTRAMUSCULAR | Status: DC | PRN

## 2017-10-20 MED ORDER — LIDOCAINE HCL (CARDIAC) 20 MG/ML IV SOLN
INTRAVENOUS | Status: DC | PRN
  Administered 2017-10-20: 60 mg via INTRAVENOUS

## 2017-10-20 MED ORDER — BUPIVACAINE HCL (PF) 0.5 % IJ SOLN
30.0000 mL | Freq: Once | INTRAMUSCULAR | Status: DC
  Filled 2017-10-20: qty 30

## 2017-10-20 SURGICAL SUPPLY — 47 items
ADH SKN CLS APL DERMABOND .7 (GAUZE/BANDAGES/DRESSINGS) ×2
BINDER ABDOMINAL 12 ML 46-62 (SOFTGOODS) ×2 IMPLANT
CANISTER SUCT 3000ML PPV (MISCELLANEOUS) ×4 IMPLANT
CHLORAPREP W/TINT 26ML (MISCELLANEOUS) ×4 IMPLANT
COVER SURGICAL LIGHT HANDLE (MISCELLANEOUS) ×4 IMPLANT
DERMABOND ADVANCED (GAUZE/BANDAGES/DRESSINGS) ×2
DERMABOND ADVANCED .7 DNX12 (GAUZE/BANDAGES/DRESSINGS) ×2 IMPLANT
DRAPE LAPAROSCOPIC ABDOMINAL (DRAPES) ×4 IMPLANT
DRSG OPSITE POSTOP 4X8 (GAUZE/BANDAGES/DRESSINGS) ×2 IMPLANT
ELECT CAUTERY BLADE 6.4 (BLADE) ×4 IMPLANT
ELECT REM PT RETURN 9FT ADLT (ELECTROSURGICAL) ×4
ELECTRODE REM PT RTRN 9FT ADLT (ELECTROSURGICAL) ×2 IMPLANT
EVACUATOR SILICONE 100CC (DRAIN) ×2 IMPLANT
GAUZE SPONGE 4X4 12PLY STRL (GAUZE/BANDAGES/DRESSINGS) ×2 IMPLANT
GLOVE BIO SURGEON STRL SZ8 (GLOVE) ×6 IMPLANT
GLOVE BIOGEL PI IND STRL 8 (GLOVE) ×2 IMPLANT
GLOVE BIOGEL PI INDICATOR 8 (GLOVE) ×2
GOWN STRL REUS W/ TWL LRG LVL3 (GOWN DISPOSABLE) ×6 IMPLANT
GOWN STRL REUS W/ TWL XL LVL3 (GOWN DISPOSABLE) ×2 IMPLANT
GOWN STRL REUS W/TWL LRG LVL3 (GOWN DISPOSABLE) ×4
GOWN STRL REUS W/TWL XL LVL3 (GOWN DISPOSABLE) ×4
HANDLE SUCTION POOLE (INSTRUMENTS) IMPLANT
KIT BASIN OR (CUSTOM PROCEDURE TRAY) ×4 IMPLANT
KIT TURNOVER KIT B (KITS) ×4 IMPLANT
LIGASURE IMPACT 36 18CM CVD LR (INSTRUMENTS) ×2 IMPLANT
NDL HYPO 25GX1X1/2 BEV (NEEDLE) ×2 IMPLANT
NEEDLE HYPO 25GX1X1/2 BEV (NEEDLE) ×4 IMPLANT
NS IRRIG 1000ML POUR BTL (IV SOLUTION) ×4 IMPLANT
PACK GENERAL/GYN (CUSTOM PROCEDURE TRAY) ×4 IMPLANT
PAD ARMBOARD 7.5X6 YLW CONV (MISCELLANEOUS) ×4 IMPLANT
RELOAD PROXIMATE 75MM BLUE (ENDOMECHANICALS) ×8 IMPLANT
RELOAD STAPLE 75 3.8 BLU REG (ENDOMECHANICALS) IMPLANT
SPONGE LAP 18X18 X RAY DECT (DISPOSABLE) ×4 IMPLANT
STAPLER GUN LINEAR PROX 60 (STAPLE) ×2 IMPLANT
STAPLER PROXIMATE 75MM BLUE (STAPLE) ×2 IMPLANT
STAPLER VISISTAT 35W (STAPLE) ×2 IMPLANT
SUCTION POOLE HANDLE (INSTRUMENTS) ×4
SUT ETHILON 2 0 FS 18 (SUTURE) ×2 IMPLANT
SUT NOVA 1 T20/GS 25DT (SUTURE) ×4 IMPLANT
SUT PDS AB 1 TP1 54 (SUTURE) ×4 IMPLANT
SUT PDS AB 1 TP1 96 (SUTURE) ×2 IMPLANT
SUT VIC AB 2-0 SH 18 (SUTURE) ×2 IMPLANT
SUT VIC AB 3-0 SH 18 (SUTURE) ×2 IMPLANT
SYR CONTROL 10ML LL (SYRINGE) ×4 IMPLANT
TAPE CLOTH SURG 4X10 WHT LF (GAUZE/BANDAGES/DRESSINGS) ×2 IMPLANT
TOWEL OR 17X26 10 PK STRL BLUE (TOWEL DISPOSABLE) ×4 IMPLANT
YANKAUER SUCT BULB TIP NO VENT (SUCTIONS) ×2 IMPLANT

## 2017-10-20 NOTE — Transfer of Care (Signed)
Immediate Anesthesia Transfer of Care Note  Patient: Amanda Roman  Procedure(s) Performed: HERNIA REPAIR VENTRAL ADULT (N/A Abdomen) SMALL BOWEL RESECTION (Abdomen)  Patient Location: PACU  Anesthesia Type:General  Level of Consciousness: oriented, drowsy and patient cooperative  Airway & Oxygen Therapy: Patient Spontanous Breathing and Patient connected to nasal cannula oxygen  Post-op Assessment: Report given to RN and Post -op Vital signs reviewed and stable  Post vital signs: Reviewed and stable  Last Vitals:  Vitals Value Taken Time  BP 146/54 10/20/2017  2:22 PM  Temp    Pulse 69 10/20/2017  2:23 PM  Resp 25 10/20/2017  2:23 PM  SpO2 93 % 10/20/2017  2:23 PM  Vitals shown include unvalidated device data.  Last Pain:  Vitals:   10/20/17 0945  TempSrc: Oral  PainSc:          Complications: No apparent anesthesia complications

## 2017-10-20 NOTE — Interval H&P Note (Signed)
History and Physical Interval Note:  10/20/2017 10:46 AM  Amanda Roman  has presented today for surgery, with the diagnosis of INCARCERATED VENTRAL HERNIA  The various methods of treatment have been discussed with the patient and family. After consideration of risks, benefits and other options for treatment, the patient has consented to  Procedure(s): HERNIA REPAIR VENTRAL ADULT, POSSIBLE MESH REPAIR (N/A) as a surgical intervention .  The patient's history has been reviewed, patient examined, no change in status, stable for surgery.  I have reviewed the patient's chart and labs.  Questions were answered to the patient's satisfaction.   The risk of hernia repair include bleeding,  Infection,   Recurrence of the hernia,  Mesh use, chronic pain,  Organ injury,  Bowel injury,  Bladder injury,   nerve injury with numbness around the incision,  Death,  and worsening of preexisting  medical problems.  The alternatives to surgery have been discussed as well..  Long term expectations of both operative and non operative treatments have been discussed.   The patient agrees to proceed.   [place]

## 2017-10-20 NOTE — Interval H&P Note (Signed)
History and Physical Interval Note:  10/20/2017 10:45 AM  Amanda Roman  has presented today for surgery, with the diagnosis of INCARCERATED VENTRAL HERNIA  The various methods of treatment have been discussed with the patient and family. After consideration of risks, benefits and other options for treatment, the patient has consented to  Procedure(s): HERNIA REPAIR VENTRAL ADULT, POSSIBLE MESH REPAIR (N/A) as a surgical intervention .  The patient's history has been reviewed, patient examined, no change in status, stable for surgery.  I have reviewed the patient's chart and labs.  Questions were answered to the patient's satisfaction.     [place]

## 2017-10-20 NOTE — Progress Notes (Signed)
Subjective No acute events. Stable abdominal discomfort  Objective: Vital signs in last 24 hours: Temp:  [98.4 F (36.9 C)-99 F (37.2 C)] 99 F (37.2 C) (04/13 0302) Pulse Rate:  [65-78] 76 (04/13 0302) Resp:  [15-17] 17 (04/13 0302) BP: (161-172)/(71-88) 172/88 (04/13 0302) SpO2:  [89 %-100 %] 100 % (04/13 0302) Weight:  [75.1 kg (165 lb 9.1 oz)-79.8 kg (176 lb)] 75.1 kg (165 lb 9.1 oz) (04/13 0302) Last BM Date: 10/19/17  Intake/Output from previous day: 04/12 0701 - 04/13 0700 In: 1000 [IV Piggyback:1000] Out: -  Intake/Output this shift: No intake/output data recorded.  Gen: NAD, comfortable CV: RRR Pulm: Normal work of breathing Abd: Soft, mildly ttp in hernia sac - left of midline; no erythema to skin overlying. Hernia firm, not reducible. Ext: SCDs in place  Lab Results: CBC  Recent Labs    10/19/17 2050 10/19/17 2131  WBC 6.4  --   HGB 13.8 15.0  HCT 40.6 44.0  PLT 215  --    BMET Recent Labs    10/19/17 2050 10/19/17 2131 10/20/17 0427  NA 137 139 135  K 3.8 3.7 5.1  CL 100* 100* 98*  CO2 27  --  25  GLUCOSE 134* 136* 110*  BUN 6 7 5*  CREATININE 0.60 0.60 0.51  CALCIUM 9.5  --  9.2   PT/INR No results for input(s): LABPROT, INR in the last 72 hours. ABG No results for input(s): PHART, HCO3 in the last 72 hours.  Invalid input(s): PCO2, PO2  Studies/Results:  Anti-infectives: Anti-infectives (From admission, onward)   Start     Dose/Rate Route Frequency Ordered Stop   10/20/17 0800  vancomycin (VANCOCIN) IVPB 1000 mg/200 mL premix     1,000 mg 200 mL/hr over 60 Minutes Intravenous To [facility] 10/20/17 0354 10/21/17 0800       Assessment/Plan: Patient Active Problem List   Diagnosis Date Noted  . Partial small bowel obstruction ([place]) 10/20/2017  . Leg ulcer, left ([place]) 08/23/2017  . Chronic venous insufficiency 05/02/2012  . Hemorrhoids without complication 98/33/8250  . Healthcare maintenance 05/11/2011  . Tubular  adenoma of colon 07/18/2006  . Osteopenia 07/18/2006  . Morbid obesity with BMI of 40.0-44.9, adult ([place]) 05/03/2006  . Essential hypertension 05/03/2006  . Degenerative joint disease involving multiple joints 05/03/2006   A/P Ms. Rueckert is a 51yoF with incarcerated bowel containing incisional hernia with associated SBO  -Anticipate OR later today with my partner, Dr. Brantley Stage -The anatomy and physiology of the GI tract and abdominal wall was discussed at length with the patient with associated illustrations. The pathophysiology of hernias was discussed at length with associated pictures.  -The planned procedure, material risks (including, but not limited to, pain, bleeding, infection, scarring, need for blood transfusion, damage to surrounding structures-blood vessels/nerves/viscus/organs, need for additional procedures, recurrence, dehiscence, chronic pain, mesh complications including erosion into other structures/vessels/organs/viscus, pneumonia, heart attack, stroke, death) benefits and alternatives to surgery were discussed at length. I noted a good probability that the procedure help improve their symptoms. The patient's questions were answered to their satisfaction, they voiced understanding and they elected to proceed with surgery. Additionally, we discussed typical postoperative expectations and the recovery process.   LOS: 0 days   Sharon Mt. Dema Severin, M.D. [facility]  Note: This dictation was prepared with Dragon/digital dictation along with [employer]. Any transcriptional errors that result from this process are unintentional.
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
## 2017-10-20 NOTE — Anesthesia Procedure Notes (Signed)
Central Venous Catheter Insertion Performed by: Roderic Palau, MD, anesthesiologist Start/End4/13/2019 11:45 AM, 10/20/2017 11:55 AM Patient location: OR. Preanesthetic checklist: patient identified, IV checked, site marked, risks and benefits discussed, surgical consent, monitors and equipment checked, pre-op evaluation, timeout performed and anesthesia consent Position: Trendelenburg Lidocaine 1% used for infiltration and patient sedated Hand hygiene performed , maximum sterile barriers used  and Seldinger technique used Catheter size: 8 Fr Total catheter length 16. Central line was placed.Double lumen Procedure performed using ultrasound guided technique. Ultrasound Notes:anatomy identified, needle tip was noted to be adjacent to the nerve/plexus identified, no ultrasound evidence of intravascular and/or intraneural injection and image(s) printed for medical record Attempts: 1 Following insertion, dressing applied, line sutured and Biopatch. Post procedure assessment: blood return through all ports  Patient tolerated the procedure well with no immediate complications.

## 2017-10-20 NOTE — Anesthesia Postprocedure Evaluation (Signed)
Anesthesia Post Note  Patient: Amanda Roman  Procedure(s) Performed: HERNIA REPAIR VENTRAL ADULT (N/A Abdomen) SMALL BOWEL RESECTION (Abdomen)     Patient location during evaluation: PACU Anesthesia Type: General Level of consciousness: awake and alert Pain management: pain level controlled Vital Signs Assessment: post-procedure vital signs reviewed and stable Respiratory status: spontaneous breathing, nonlabored ventilation, respiratory function stable and patient connected to nasal cannula oxygen Cardiovascular status: blood pressure returned to baseline and stable Postop Assessment: no apparent nausea or vomiting Anesthetic complications: no    Last Vitals:  Vitals:   10/20/17 1545 10/20/17 1550  BP:    Pulse: 62   Resp: 11   Temp:  (!) 36.2 C  SpO2: 100%     Last Pain:  Vitals:   10/20/17 0945  TempSrc: Oral  PainSc:                  Earvin Blazier,W. EDMOND

## 2017-10-20 NOTE — H&P (View-Only) (Signed)
Subjective No acute events. Stable abdominal discomfort  Objective: Vital signs in last 24 hours: Temp:  [98.4 F (36.9 C)-99 F (37.2 C)] 99 F (37.2 C) (04/13 0302) Pulse Rate:  [65-78] 76 (04/13 0302) Resp:  [15-17] 17 (04/13 0302) BP: (161-172)/(71-88) 172/88 (04/13 0302) SpO2:  [89 %-100 %] 100 % (04/13 0302) Weight:  [75.1 kg (165 lb 9.1 oz)-79.8 kg (176 lb)] 75.1 kg (165 lb 9.1 oz) (04/13 0302) Last BM Date: 10/19/17  Intake/Output from previous day: 04/12 0701 - 04/13 0700 In: 1000 [IV Piggyback:1000] Out: -  Intake/Output this shift: No intake/output data recorded.  Gen: NAD, comfortable CV: RRR Pulm: Normal work of breathing Abd: Soft, mildly ttp in hernia sac - left of midline; no erythema to skin overlying. Hernia firm, not reducible. Ext: SCDs in place  Lab Results: CBC  Recent Labs    10/19/17 2050 10/19/17 2131  WBC 6.4  --   HGB 13.8 15.0  HCT 40.6 44.0  PLT 215  --    BMET Recent Labs    10/19/17 2050 10/19/17 2131 10/20/17 0427  NA 137 139 135  K 3.8 3.7 5.1  CL 100* 100* 98*  CO2 27  --  25  GLUCOSE 134* 136* 110*  BUN 6 7 5*  CREATININE 0.60 0.60 0.51  CALCIUM 9.5  --  9.2   PT/INR No results for input(s): LABPROT, INR in the last 72 hours. ABG No results for input(s): PHART, HCO3 in the last 72 hours.  Invalid input(s): PCO2, PO2  Studies/Results:  Anti-infectives: Anti-infectives (From admission, onward)   Start     Dose/Rate Route Frequency Ordered Stop   10/20/17 0800  vancomycin (VANCOCIN) IVPB 1000 mg/200 mL premix     1,000 mg 200 mL/hr over 60 Minutes Intravenous To [facility] 10/20/17 0354 10/21/17 0800       Assessment/Plan: Patient Active Problem List   Diagnosis Date Noted  . Partial small bowel obstruction ([place]) 10/20/2017  . Leg ulcer, left ([place]) 08/23/2017  . Chronic venous insufficiency 05/02/2012  . Hemorrhoids without complication 62/70/3500  . Healthcare maintenance 05/11/2011  . Tubular  adenoma of colon 07/18/2006  . Osteopenia 07/18/2006  . Morbid obesity with BMI of 40.0-44.9, adult ([place]) 05/03/2006  . Essential hypertension 05/03/2006  . Degenerative joint disease involving multiple joints 05/03/2006   A/P Ms. Tooley is a 48yoF with incarcerated bowel containing incisional hernia with associated SBO  -Anticipate OR later today with my partner, Dr. Brantley Stage -The anatomy and physiology of the GI tract and abdominal wall was discussed at length with the patient with associated illustrations. The pathophysiology of hernias was discussed at length with associated pictures.  -The planned procedure, material risks (including, but not limited to, pain, bleeding, infection, scarring, need for blood transfusion, damage to surrounding structures-blood vessels/nerves/viscus/organs, need for additional procedures, recurrence, dehiscence, chronic pain, mesh complications including erosion into other structures/vessels/organs/viscus, pneumonia, heart attack, stroke, death) benefits and alternatives to surgery were discussed at length. I noted a good probability that the procedure help improve their symptoms. The patient's questions were answered to their satisfaction, they voiced understanding and they elected to proceed with surgery. Additionally, we discussed typical postoperative expectations and the recovery process.   LOS: 0 days   Sharon Mt. Dema Severin, M.D. [facility]  Note: This dictation was prepared with Dragon/digital dictation along with [employer]. Any transcriptional errors that result from this process are unintentional.
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
## 2017-10-20 NOTE — Progress Notes (Signed)
   Subjective: [name] continues to endorse pain in the area of her incarcerated hernia. She denied fever, chills, nausea, vomiting, or generalized weakness.   Objective:  Vital signs in last 24 hours: Vitals:   10/20/17 1530 10/20/17 1545 10/20/17 1550 10/20/17 1622  BP:    (!) 161/84  Pulse: (!) 57 62  (!) 57  Resp: 13 11  13   Temp:   (!) 97.2 F (36.2 C) 98.3 F (36.8 C)  TempSrc:    Oral  SpO2: 99% 100%  100%  Weight:      Height:       General: in no acute distress, afebrile, nondiaphoretic, conversant Neuro: No acute focal deficits Cardio: regular rate and rhythm, there is a gallop, she has 2+ bilateral lower extremity edema   Pulmonary: Lungs clear to auscultation bilaterally, no increased work of breathing GI: BS hyperactive, hyperresonant to percussion, Abdomen is soft, tender to palpation in the LLQ and a large mass is palpable in in the area of the incarcerated hernia, otherwise no tenderness or distention   Assessment/Plan:  [name] is a very pleasant 77 year old woman with a history of hypertension, GERD, osteoarthritis, and chronic venous insufficiency who is here for management of a small bowel obstruction secondary to an incarcerated ventral hernia.    Partial small bowel obstruction (HCC) secondary to incarcerated ventral hernia  - NG tube to intermittent suction with bilious fluid, no nausea or vomiting, no flatus or bowel movements since the time of admission  - Amanda Roman went for open surgical reduction of the incarcerated loops of bowel and had partial small bowel resection of an ischemic incarcerated loops prior to repairing the hernia with mesh and a drain was placed in the left lower quadrant  - hemodynamically stable at this time, no signs of perforation noted in the op note  -Fentanyl PCA and dilaudid 0.5 q4 h PRN  - NPO, NS 100 cc/hr - may benefit from stool softener for prophylaxis going forward  - general surgery is following, appreciate  their recommendations   HTN: Hypertensive to 161/84 at this time. We will continue her home antihypertensives once she is able to tolerate PO medications.  -Labetalol IV discontinued due to bradycardia -IV hydralazine PRN for BP >180/110 either or -Amlodipine 10 mg daily when she can tolerate PO meds -Carvedilol 25 once she tolerates PO meds  Dispo: Anticipated discharge in approximately 2-3 day(s).   Amanda Noss, MD 10/20/2017, 5:00 PM Pager: Pager# [phone]

## 2017-10-20 NOTE — Anesthesia Procedure Notes (Signed)
Procedure Name: Intubation Date/Time: 10/20/2017 11:43 AM Performed by: Scheryl Darter, CRNA Pre-anesthesia Checklist: Patient identified, Emergency Drugs available, Suction available and Patient being monitored Patient Re-evaluated:Patient Re-evaluated prior to induction Oxygen Delivery Method: Circle System Utilized Preoxygenation: Pre-oxygenation with 100% oxygen Induction Type: IV induction and Rapid sequence Ventilation: Mask ventilation without difficulty Laryngoscope Size: Mac and 3 Grade View: Grade I Tube type: Oral Tube size: 7.5 mm Number of attempts: 1 Airway Equipment and Method: Stylet and Oral airway Placement Confirmation: ETT inserted through vocal cords under direct vision,  positive ETCO2 and breath sounds checked- equal and bilateral Secured at: 22 cm Tube secured with: Tape Dental Injury: Teeth and Oropharynx as per pre-operative assessment

## 2017-10-20 NOTE — Anesthesia Preprocedure Evaluation (Addendum)
Anesthesia Evaluation  Patient identified by MRN, date of birth, ID band Patient awake    Reviewed: Allergy & Precautions, H&P , NPO status , Patient's Chart, lab work & pertinent test results, reviewed documented beta blocker date and time   Airway Mallampati: III  TM Distance: >3 FB Neck ROM: Full    Dental no notable dental hx. (+) Edentulous Upper, Edentulous Lower, Dental Advisory Given   Pulmonary neg pulmonary ROS, former smoker,    Pulmonary exam normal        Cardiovascular hypertension, Pt. on medications and Pt. on home beta blockers + Peripheral Vascular Disease   Rhythm:Regular Rate:Normal     Neuro/Psych negative neurological ROS  negative psych ROS   GI/Hepatic Neg liver ROS, GERD  ,  Endo/Other  negative endocrine ROS  Renal/GU negative Renal ROS  negative genitourinary   Musculoskeletal  (+) Arthritis , Osteoarthritis,    Abdominal   Peds  Hematology negative hematology ROS (+)   Anesthesia Other Findings   Reproductive/Obstetrics negative OB ROS                            Anesthesia Physical Anesthesia Plan  ASA: II  Anesthesia Plan: General   Post-op Pain Management:    Induction: Intravenous, Rapid sequence and Cricoid pressure planned  PONV Risk Score and Plan: 4 or greater and Ondansetron, Dexamethasone and Treatment may vary due to age or medical condition  Airway Management Planned: Oral ETT  Additional Equipment:   Intra-op Plan:   Post-operative Plan: Extubation in OR  Informed Consent: I have reviewed the patients History and Physical, chart, labs and discussed the procedure including the risks, benefits and alternatives for the proposed anesthesia with the patient or authorized representative who has indicated his/her understanding and acceptance.   Dental advisory given  Plan Discussed with: CRNA  Anesthesia Plan Comments:          Anesthesia Quick Evaluation

## 2017-10-20 NOTE — Op Note (Signed)
Preoperative diagnosis: Incarcerated incisional hernia  Postoperative diagnosis: Same  Procedure: Primary repair of incarcerated incisional hernia with small bowel resection  Surgeon: Erroll Luna MD  Anesthesia: General with local utilizing Exparel diluted with 20 cc saline   EBL: 70 cc  Specimen: Small bowel consisting of distal jejunum to pathology  Drains: 19 round [place] drain  IV fluids: Per anesthesia record  Indications for procedure: The patient is a 77 year old female who presents to the emergency room to the teaching service with nausea and vomiting times 2 days.  Evaluation revealed a large non-reducible incisional hernia involving the lower abdominal wall from previous surgery.  CT scan showed hernia without obstruction.  Clinically, she had signs of obstruction and therefore surgery was recommended for concern of incarceration and possible strangulation.  Risk, benefits and other options discussed with the patient and her sister at the bedside.The risk of hernia repair include bleeding,  Infection,   Recurrence of the hernia,  Mesh use, chronic pain,  Organ injury,  Bowel injury,  Bladder injury,   nerve injury with numbness around the incision,  Death,  and worsening of preexisting  medical problems.  The alternatives to surgery have been discussed as well..  Long term expectations of both operative and non operative treatments have been discussed.   The patient agrees to proceed.  Description of procedure: The patient was met in the holding area.  I answered all of her questions.  She was taken back to the operating room placed supine on the operating room table.  After induction of general anesthesia, a Foley catheter was placed under direct vision.  Timeout was done.  Proper patient and procedure were verified.  Lower midline incision was used to an old scar.  She had multiple hernia defects involving the lower abdominal wall with multiple projections of the omentum in these  fascial defects.  The largest was about 3 cm.  This contains small bowel and omentum.  I carefully took the adhesions down.  I was able to reduce the hernia after opening the neck of the hernia sac reducing the small bowel but this was ischemic appearing did not appear viable.  I felt that resection would be in her best interest.  No evidence of perforation at this point time.  Using the LigaSure took down the mesentery.  The segment was about 10-12 cm appeared to be the distal jejunum.  We used a GIA 75 stapling devices to divide the proximal and distal bowel.  The segment was passed off the field.  A side-to-side functional end-to-end anastomosis was created using the GIA 75 stapler using blue cartridges and a TA 60 the common enterotomy.  The anastomosis was widely patent under no tension and there is no signs of any bleeding.  The mesenteric defect was closed with 2-0 Vicryl.  This was not kinked and a crotch stitch was placed at the anastomosis.  This was placed back in the abdomen.  The small bowel otherwise was run and found to be normal.  The descending colon was normal as well as sigmoid colon.  A ascending and transverse colons were grossly normal upon inspection.  There is no sign no signs of any pelvic mass.  The bladder is well decompressed with a Foley.  I then began to mobilize the fat and skin off of the fascial anteriorly.  She had significant rectus diastases.  Given small bowel resection I did not opt to use mesh in this circumstance.  I was able to  mobilize the midline quite easily and she had a very floppy abdomen.  I felt that primary closure would be in her best interest given her advanced age and poor medical condition overall.  I mobilized the hernia sac off and passed any excess hernia sac off the field.  This exposed the fascia was pulled medially to the midline.  I then irrigated out the abdominal cavity and changed gloves.  I then repaired the abdominal wall primarily with #1 PDS and  interrupted #1 Novafil.  This closed the defect nicely.  I then was able to imbricate the lower part of both rectus muscles back to the midline over the repair using #1 Novafil.  Hemostasis was achieved.  Through separate stab incision around [employer] drain was placed.  This was secured to the skin with 2-0 nylon.  Exparel was injected as a block.  This was done up and down the rectus sheath.  I then closed the skin with staples.  Bulb placed to suction.  Binder placed.  All final counts found to be correct after application of honeycomb dressing.  The patient was then awoke extubated taken to recovery in satisfactory condition.

## 2017-10-21 ENCOUNTER — Encounter (HOSPITAL_COMMUNITY): Payer: Self-pay | Admitting: Surgery

## 2017-10-21 DIAGNOSIS — E876 Hypokalemia: Secondary | ICD-10-CM

## 2017-10-21 LAB — BASIC METABOLIC PANEL
Anion gap: 9 (ref 5–15)
BUN: 6 mg/dL (ref 6–20)
CHLORIDE: 99 mmol/L — AB (ref 101–111)
CO2: 29 mmol/L (ref 22–32)
CREATININE: 0.59 mg/dL (ref 0.44–1.00)
Calcium: 9.2 mg/dL (ref 8.9–10.3)
GFR calc Af Amer: >60 mL/min (ref >60)
GFR calc non Af Amer: >60 mL/min (ref >60)
Glucose, Bld: 128 mg/dL — ABNORMAL HIGH (ref 65–99)
Potassium: 3.1 mmol/L — ABNORMAL LOW (ref 3.5–5.1)
Sodium: 137 mmol/L (ref 135–145)

## 2017-10-21 LAB — CBC
HCT: 38.9 % (ref 36.0–46.0)
Hemoglobin: 13.2 g/dL (ref 12.0–15.0)
MCH: 30.7 pg (ref 26.0–34.0)
MCHC: 33.9 g/dL (ref 30.0–36.0)
MCV: 90.5 fL (ref 78.0–100.0)
Platelets: 237 10*3/uL (ref 150–400)
RBC: 4.3 MIL/uL (ref 3.87–5.11)
RDW: 12.6 % (ref 11.5–15.5)
WBC: 9 10*3/uL (ref 4.0–10.5)

## 2017-10-21 LAB — GLUCOSE, CAPILLARY: Glucose-Capillary: 129 mg/dL — ABNORMAL HIGH (ref 65–99)

## 2017-10-21 MED ORDER — POTASSIUM CHLORIDE 10 MEQ/100ML IV SOLN
10.0000 meq | INTRAVENOUS | Status: AC
  Administered 2017-10-21 (×5): 10 meq via INTRAVENOUS
  Filled 2017-10-21 (×5): qty 100

## 2017-10-21 MED ORDER — ENOXAPARIN SODIUM 40 MG/0.4ML ~~LOC~~ SOLN
40.0000 mg | SUBCUTANEOUS | Status: DC
  Administered 2017-10-21 – 2017-10-26 (×6): 40 mg via SUBCUTANEOUS
  Filled 2017-10-21 (×6): qty 0.4

## 2017-10-21 NOTE — Progress Notes (Signed)
Rounding on patient,found NGT out. MD paged to be informed

## 2017-10-21 NOTE — Progress Notes (Signed)
Patient became agitated, got out bed yelling and screaming for her sister. Pulling on tubes and central line. JP drain bulb became disconnected and was replaced with new one. Patient was reassured by her sister on phone. Returned to bed.Bed alarm on. Will monitor closely.

## 2017-10-21 NOTE — Progress Notes (Signed)
Subjective No acute events. OR yesterday - SBR and primary repair. This was discussed with the patient on rounds today. She expressed understanding. Sore as expected; no n/v with NGT in place. Has not been out of bed. No flatus/BM yet.  Objective: Vital signs in last 24 hours: Temp:  [97.2 F (36.2 C)-100.6 F (38.1 C)] 99.3 F (37.4 C) (04/14 0827) Pulse Rate:  [57-81] 78 (04/14 0827) Resp:  [11-24] 13 (04/14 0944) BP: (146-181)/(54-84) 165/75 (04/14 0827) SpO2:  [92 %-100 %] 97 % (04/14 0944) Last BM Date: 10/19/17  Intake/Output from previous day: 04/13 0701 - 04/14 0700 In: 1300 [I.V.:1200] Out: 1202 [Urine:950; Emesis/NG output:150; Drains:52; Blood:50] Intake/Output this shift: No intake/output data recorded.  Gen: NAD, comfortable CV: RRR Pulm: Normal work of breathing Abd: Soft, minimal peri-incisional tenderness; obese difficult to assess distention. No rebound/guarding. Incision dressed and dry. JP output ss. Ext: SCDs in place  Lab Results: CBC  Recent Labs    10/19/17 2050 10/19/17 2131 10/21/17 0709  WBC 6.4  --  9.0  HGB 13.8 15.0 13.2  HCT 40.6 44.0 38.9  PLT 215  --  237   BMET Recent Labs    10/20/17 0427 10/21/17 0709  NA 135 137  K 5.1 3.1*  CL 98* 99*  CO2 25 29  GLUCOSE 110* 128*  BUN 5* 6  CREATININE 0.51 0.59  CALCIUM 9.2 9.2   PT/INR No results for input(s): LABPROT, INR in the last 72 hours. ABG No results for input(s): PHART, HCO3 in the last 72 hours.  Invalid input(s): PCO2, PO2  Studies/Results:  Anti-infectives: Anti-infectives (From admission, onward)   Start     Dose/Rate Route Frequency Ordered Stop   10/20/17 0800  vancomycin (VANCOCIN) IVPB 1000 mg/200 mL premix     1,000 mg 200 mL/hr over 60 Minutes Intravenous To [facility] 10/20/17 0354 10/20/17 1243       Assessment/Plan: Patient Active Problem List   Diagnosis Date Noted  . Partial small bowel obstruction ([place]) 10/20/2017  . Leg ulcer, left  ([place]) 08/23/2017  . Chronic venous insufficiency 05/02/2012  . Hemorrhoids without complication 07/18/3233  . Healthcare maintenance 05/11/2011  . Tubular adenoma of colon 07/18/2006  . Osteopenia 07/18/2006  . Morbid obesity with BMI of 40.0-44.9, adult ([place]) 05/03/2006  . Essential hypertension 05/03/2006  . Degenerative joint disease involving multiple joints 05/03/2006   s/p Procedure(s): HERNIA REPAIR VENTRAL ADULT SMALL BOWEL RESECTION 10/20/2017  -Expected postoperative ileus; NGT in place with bilious drainage -NPO, MIVF -Ambulate with assistance -Abdominal binder -PT/OT; may need SNF in future -PPx: Lovenox, SCDs   LOS: 1 day   Sharon Mt. Dema Severin, M.D. [facility]  Note: This dictation was prepared with Dragon/digital dictation along with [employer]. Any transcriptional errors that result from this process are unintentional.

## 2017-10-21 NOTE — Progress Notes (Addendum)
Received orders to leave NGT out and watch closely for nausea vomiting or abdominal distention.

## 2017-10-21 NOTE — Progress Notes (Signed)
   Subjective: [name] was seen resting in her bed. She says she is feeling okay, but is having intermittent pain in her abdomen. The pain remains adequately controlled with her current regimen. She is not experiencing nausea at this time. She has no other complaints this morning.  Objective:  Vital signs in last 24 hours: Vitals:   10/20/17 2332 10/21/17 0217 10/21/17 0421 10/21/17 0557  BP:  (!) 181/72  (!) 180/77  Pulse:  81  70  Resp: 20 20 20 20   Temp:  (!) 100.6 F (38.1 C)  99.5 F (37.5 C)  TempSrc:  Oral  Oral  SpO2: 98% 99% 98% 100%  Weight:      Height:       Physical Exam  Constitutional: She appears well-developed and well-nourished.  Tired appearing, elderly female NG Tube with bilious drainage  Cardiovascular: Normal rate, regular rhythm and intact distal pulses.  Systolic murmur  Pulmonary/Chest: Effort normal and breath sounds normal. No respiratory distress.  Abdominal: Soft.  Hypoactive bowl sounds Diffusely tender to palpation lower quadrant > uper quadrants Dressing and abdominal binder in place JP Drain with ~27mL Serosanguinous drainage  Skin: Skin is warm and dry.   Assessment/Plan: 77 yo F with a history of hypertension, GERD, osteoarthritis, and chronic venous insufficiency who presented with small bowel obstruction secondary to an incarcerated ventral hernia.  Partial small bowel obstruction (HCC) secondary to incarcerated ventral hernia  - Open surgical reduction of incarcerated bowel and partial resection ischemic loops; then hernia repair with drain placed. (No signs of perforation) - Temp 100.6 overnight (3/13), no PRN given, resolved, sususpect post-surgical fever, WBC 9.0 (4/14) - General surgery is following, appreciate their recommendations  - Fentanyl PCA and dilaudid 0.5 q4 h PRN; will likely need stool softeners - NG tube to intermittent suction with bilious fluid, no nausea or vomiting, no flatus or bowel movements yet - NPO, NS 100  cc/hr  HTN: Hypertensive to 180s overnight, PRN given. Will continue her home antihypertensives once she is able to tolerate PO medications.  - Labetalol IV discontinued due to bradycardia - IV hydralazine PRN for BP >180/110 either or - Amlodipine 10 mg daily when she can tolerate PO meds - Carvedilol 25 once she tolerates PO meds  Hypokalemia: - K 3.1 on 4/14 - 12mEq IV K x 5  Dispo: Anticipated discharge in approximately 2-3 day(s).   Neva Seat, MD 10/21/2017, 7:20 AM Pager: Pager# [phone]

## 2017-10-22 ENCOUNTER — Other Ambulatory Visit: Payer: Self-pay

## 2017-10-22 ENCOUNTER — Encounter (HOSPITAL_COMMUNITY): Payer: Self-pay | Admitting: General Practice

## 2017-10-22 DIAGNOSIS — K450 Other specified abdominal hernia with obstruction, without gangrene: Secondary | ICD-10-CM | POA: Diagnosis present

## 2017-10-22 DIAGNOSIS — E876 Hypokalemia: Secondary | ICD-10-CM | POA: Diagnosis present

## 2017-10-22 LAB — BASIC METABOLIC PANEL
ANION GAP: 11 (ref 5–15)
BUN: 6 mg/dL (ref 6–20)
CO2: 28 mmol/L (ref 22–32)
Calcium: 8.8 mg/dL — ABNORMAL LOW (ref 8.9–10.3)
Chloride: 97 mmol/L — ABNORMAL LOW (ref 101–111)
Creatinine, Ser: 0.5 mg/dL (ref 0.44–1.00)
GFR calc Af Amer: >60 mL/min (ref >60)
Glucose, Bld: 106 mg/dL — ABNORMAL HIGH (ref 65–99)
POTASSIUM: 3.2 mmol/L — AB (ref 3.5–5.1)
SODIUM: 136 mmol/L (ref 135–145)

## 2017-10-22 LAB — CBC WITH DIFFERENTIAL/PLATELET
Basophils Absolute: 0 10*3/uL (ref 0.0–0.1)
Basophils Relative: 0 %
EOS ABS: 0 10*3/uL (ref 0.0–0.7)
EOS PCT: 0 %
HCT: 34.6 % — ABNORMAL LOW (ref 36.0–46.0)
Hemoglobin: 11.7 g/dL — ABNORMAL LOW (ref 12.0–15.0)
Lymphocytes Relative: 19 %
Lymphs Abs: 1.6 10*3/uL (ref 0.7–4.0)
MCH: 31 pg (ref 26.0–34.0)
MCHC: 33.8 g/dL (ref 30.0–36.0)
MCV: 91.5 fL (ref 78.0–100.0)
MONO ABS: 0.6 10*3/uL (ref 0.1–1.0)
Monocytes Relative: 8 %
Neutro Abs: 6.1 10*3/uL (ref 1.7–7.7)
Neutrophils Relative %: 73 %
PLATELETS: 209 10*3/uL (ref 150–400)
RBC: 3.78 MIL/uL — AB (ref 3.87–5.11)
RDW: 13 % (ref 11.5–15.5)
WBC: 8.4 10*3/uL (ref 4.0–10.5)

## 2017-10-22 LAB — GLUCOSE, CAPILLARY: GLUCOSE-CAPILLARY: 95 mg/dL (ref 65–99)

## 2017-10-22 MED ORDER — ONDANSETRON HCL 4 MG PO TABS: 4.0000 mg | ORAL_TABLET | Freq: Four times a day (QID) | ORAL | Status: DC | PRN

## 2017-10-22 MED ORDER — ONDANSETRON HCL 4 MG/2ML IJ SOLN
4.0000 mg | INTRAMUSCULAR | Status: DC | PRN
  Administered 2017-10-22 – 2017-10-23 (×3): 4 mg via INTRAVENOUS
  Filled 2017-10-22 (×2): qty 2

## 2017-10-22 MED ORDER — DEXTROSE-NACL 5-0.45 % IV SOLN
INTRAVENOUS | Status: DC
  Administered 2017-10-22 – 2017-10-25 (×7): via INTRAVENOUS

## 2017-10-22 MED ORDER — FENTANYL CITRATE (PF) 100 MCG/2ML IJ SOLN: 12.5000 ug | INTRAMUSCULAR | Status: DC | PRN

## 2017-10-22 MED ORDER — ACETAMINOPHEN 10 MG/ML IV SOLN
1000.0000 mg | Freq: Three times a day (TID) | INTRAVENOUS | Status: AC
  Administered 2017-10-22 – 2017-10-23 (×3): 1000 mg via INTRAVENOUS
  Filled 2017-10-22 (×3): qty 100

## 2017-10-22 MED ORDER — BISACODYL 10 MG RE SUPP
10.0000 mg | Freq: Once | RECTAL | Status: AC
  Administered 2017-10-22: 10 mg via RECTAL
  Filled 2017-10-22: qty 1

## 2017-10-22 MED ORDER — WHITE PETROLATUM EX OINT
TOPICAL_OINTMENT | CUTANEOUS | Status: AC
  Filled 2017-10-22: qty 28.35

## 2017-10-22 MED ORDER — POTASSIUM CHLORIDE 10 MEQ/50ML IV SOLN
10.0000 meq | INTRAVENOUS | Status: AC
  Administered 2017-10-22 (×4): 10 meq via INTRAVENOUS
  Filled 2017-10-22 (×5): qty 50

## 2017-10-22 MED ORDER — POTASSIUM CHLORIDE 10 MEQ/100ML IV SOLN
10.0000 meq | INTRAVENOUS | Status: DC
  Administered 2017-10-22: 10 meq via INTRAVENOUS
  Filled 2017-10-22: qty 100

## 2017-10-22 NOTE — Evaluation (Signed)
Occupational Therapy Evaluation Patient Details Name: Amanda Roman MRN: [id number] DOB: 05-21-1941 Today's Date: 10/22/2017    History of Present Illness Amanda Roman is a very pleasant 77 year old woman with a history of hypertension, GERD, osteoarthritis, and chronic venous insufficiency who is here for management of a small bowel obstruction secondary to an incarcerated ventral hernia.   Clinical Impression   Patient presenting with decreased I in self care, mobility, balance, safety awareness, strength, and endurance.  Patient was mod I with use of cane, RW, and sink bathing PTA. Patient currently functioning min - mod A. Patient will benefit from acute OT to increase overall independence in the areas of ADLs, functional mobility, balance in order to safely discharge home with family.    Follow Up Recommendations  Home health OT;Supervision/Assistance - 24 hour    Equipment Recommendations  3 in 1 bedside commode    Recommendations for Other Services (none at this time)     Precautions / Restrictions Precautions Precautions: Fall Restrictions Weight Bearing Restrictions: No      Mobility Bed Mobility Overal bed mobility: Needs Assistance Bed Mobility: Supine to Sit     Supine to sit: Min assist     General bed mobility comments: min A to elevate trunk with cues for sequence and technique  Transfers   Equipment used: Rolling walker (2 wheeled)      General transfer comment: mod A for lifting assistance from EOB    Balance Overall balance assessment: Needs assistance Sitting-balance support: Feet supported Sitting balance-Leahy Scale: Fair     Standing balance support: Bilateral upper extremity supported Standing balance-Leahy Scale: Poor Standing balance comment: relies on RW     ADL either performed or assessed with clinical judgement   ADL Overall ADL's : Needs assistance/impaired     Grooming: Wash/dry face;Sitting;Set up   Lower Body Dressing:  Total assistance     Functional mobility during ADLs: Minimal assistance;Rolling walker;Cueing for sequencing;Cueing for safety General ADL Comments: [name] with min A for balance and assistance to advance RW. Pt required assist to don B socks. Caregiver reports pt requiring more assistance prior to hospital admission.      Vision Baseline Vision/History: Wears glasses Wears Glasses: At all times Patient Visual Report: No change from baseline Vision Assessment?: No apparent visual deficits            Pertinent Vitals/Pain Pain Assessment: Faces Pain Score: 0-No pain     Hand Dominance Right   Extremity/Trunk Assessment Upper Extremity Assessment Upper Extremity Assessment: Generalized weakness   Lower Extremity Assessment Lower Extremity Assessment: Defer to PT evaluation       Communication Communication Communication: No difficulties   Cognition Arousal/Alertness: Lethargic Behavior During Therapy: WFL for tasks assessed/performed Overall Cognitive Status: Within Functional Limits for tasks assessed                    Home Living Family/patient expects to be discharged to:: Private residence Living Arrangements: Alone Available Help at Discharge: Family;Available 24 hours/day Type of Home: House Home Access: Stairs to enter [employer] of Steps: 2 per sister Entrance Stairs-Rails: Right Home Layout: One level     Bathroom Shower/Tub: Teacher, early years/pre: Standard Bathroom Accessibility: Yes How Accessible: Accessible via walker Home Equipment: [place] - single point;Walker - standard   Additional Comments: pt and caregiver confirm pt used SPC at home and RW in community      Prior Functioning/Environment Level of Independence:  Independent with assistive device(s)             OT Problem List: Decreased strength;Impaired balance (sitting and/or standing);Pain;Decreased safety awareness;Decreased  activity tolerance;Decreased coordination;Decreased knowledge of use of DME or AE      OT Treatment/Interventions: Self-care/ADL training;Manual therapy;Therapeutic exercise;Patient/family education;Balance training;Energy conservation;Therapeutic activities;DME and/or AE instruction    OT Goals(Current goals can be found in the care plan section) Acute Rehab OT Goals Patient Stated Goal: "to get home" OT Goal Formulation: With patient/family Time For Goal Achievement: 11/05/17 Potential to Achieve Goals: Fair ADL Goals Pt Will Perform Grooming: with supervision Pt Will Perform Upper Body Bathing: with supervision Pt Will Perform Lower Body Bathing: with supervision Pt Will Perform Upper Body Dressing: with supervision Pt Will Perform Lower Body Dressing: with supervision Pt Will Transfer to Toilet: with supervision Pt Will Perform Toileting - Clothing Manipulation and hygiene: with supervision  OT Frequency: Min 2X/week   Barriers to D/C: Other (comment)  none known at this time          AM-PAC PT "6 Clicks" Daily Activity     Outcome Measure Help from another person eating meals?: A Little Help from another person taking care of personal grooming?: A Little Help from another person toileting, which includes using toliet, bedpan, or urinal?: A Lot Help from another person bathing (including washing, rinsing, drying)?: A Lot Help from another person to put on and taking off regular upper body clothing?: A Little Help from another person to put on and taking off regular lower body clothing?: A Lot 6 Click Score: 15   End of Session Equipment Utilized During Treatment: Oxygen;Rolling walker Nurse Communication: Mobility status  Activity Tolerance: Patient limited by fatigue Patient left: in chair;with call bell/phone within reach;with chair alarm set;with family/visitor present  OT Visit Diagnosis: Unsteadiness on feet (R26.81);Muscle weakness (generalized) (M62.81)                 Time: 6213-0865 OT Time Calculation (min): 23 min Charges:  OT General Charges $OT Visit: 1 Visit OT Treatments $Self Care/Home Management : 8-22 mins   Trevionne Advani P, MS, OTR/L 10/22/2017, 10:32 AM

## 2017-10-22 NOTE — Discharge Instructions (Signed)
[facility] _______[facility] ° °UMBILICAL OR INGUINAL HERNIA REPAIR: POST OP INSTRUCTIONS ° °Always review your discharge instruction sheet given to you by the facility where your surgery was performed. °IF YOU HAVE DISABILITY OR FAMILY LEAVE FORMS, YOU MUST BRING THEM TO THE OFFICE FOR PROCESSING.   °DO NOT GIVE THEM TO YOUR DOCTOR. ° °1. A  prescription for pain medication may be given to you upon discharge.  Take your pain medication as prescribed, if needed.  If narcotic pain medicine is not needed, then you may take acetaminophen (Tylenol) or ibuprofen (Advil) as needed. °2. Take your usually prescribed medications unless otherwise directed. °If you need a refill on your pain medication, please contact your pharmacy.  They will contact our office to request authorization. Prescriptions will not be filled after 5 pm or on week-ends. °3. You should follow a light diet the first 24 hours after arrival home, such as soup and crackers, etc.  Be sure to include lots of fluids daily.  Resume your normal diet the day after surgery. °4.Most patients will experience some swelling and bruising around the umbilicus or in the groin and scrotum.  Ice packs and reclining will help.  Swelling and bruising can take several days to resolve.  °6. It is common to experience some constipation if taking pain medication after surgery.  Increasing fluid intake and taking a stool softener (such as Colace) will usually help or prevent this problem from occurring.  A mild laxative (Milk of Magnesia or Miralax) should be taken according to package directions if there are no bowel movements after 48 hours. °7. Unless discharge instructions indicate otherwise, you may remove your bandages 24-48 hours after surgery, and you may shower at that time.  You may have steri-strips (small skin tapes) in place directly over the incision.  These strips should be left on the skin for 7-10 days.  If your surgeon used skin glue on the  incision, you may shower in 24 hours.  The glue will flake off over the next 2-3 weeks.  Any sutures or staples will be removed at the office during your follow-up visit. °8. ACTIVITIES:  You may resume regular (light) daily activities beginning the next day--such as daily self-care, walking, climbing stairs--gradually increasing activities as tolerated.  You may have sexual intercourse when it is comfortable.  Refrain from any heavy lifting or straining until approved by your doctor. ° °a.You may drive when you are no longer taking prescription pain medication, you can comfortably wear a seatbelt, and you can safely maneuver your car and apply brakes. °b.RETURN TO WORK:   °_____________________________________________ ° °9.You should see your doctor in the office for a follow-up appointment approximately 2-3 weeks after your surgery.  Make sure that you call for this appointment within a day or two after you arrive home to insure a convenient appointment time. °10.OTHER INSTRUCTIONS: _________________________ °   _____________________________________ ° °WHEN TO CALL YOUR DOCTOR: °1. Fever over 101.0 °2. Inability to urinate °3. Nausea and/or vomiting °4. Extreme swelling or bruising °5. Continued bleeding from incision. °6. Increased pain, redness, or drainage from the incision ° °The clinic staff is available to answer your questions during regular business hours.  Please don’t hesitate to call and ask to speak to one of the nurses for clinical concerns.  If you have a medical emergency, go to the nearest emergency room or call 911.  A surgeon from [facility] is always on call at the hospital ° ° °  [address], [address], [place], [place]  [address] ? ° [address] [address], [place], [place]   [address] °[phone] ? [phone] ? FAX [phone] °Web site: [email or web address] °

## 2017-10-22 NOTE — Progress Notes (Signed)
[facility] Progress Note  2 Days Post-Op  Subjective: CC: ileus Patient with expected post-operative ileus. Not passing flatus, abdominal distention feels unchanged from yesterday. Mild nausea. Denies abdominal pain. Has not had any ice chips or sips of liquids.  UOP good. VSS.  Objective: Vital signs in last 24 hours: Temp:  [99 F (37.2 C)-99.8 F (37.7 C)] 99 F (37.2 C) (04/15 0550) Pulse Rate:  [69-79] 79 (04/15 0550) Resp:  [16-26] 16 (04/15 0804) BP: (160-179)/(69-71) 175/69 (04/15 0550) SpO2:  [90 %-98 %] 96 % (04/15 0804) Last BM Date: 10/19/17  Intake/Output from previous day: 04/14 0701 - 04/15 0700 In: 2726.7 [I.V.:2226.7; IV Piggyback:500] Out: 1440 [Urine:1100; Emesis/NG output:250; Drains:90] Intake/Output this shift: No intake/output data recorded.  PE: Gen:  Alert, NAD, pleasant Card:  Regular rate and rhythm, pedal pulses 2+ BL Pulm:  Normal effort, clear to auscultation bilaterally Abd: Soft, non-tender, mildly distended, bowel sounds hypoactive, no HSM, incisions C/D/I GU: foley present Skin: warm and dry, no rashes  Psych: A&Ox3   Lab Results:  Recent Labs    10/21/17 0709 10/22/17 0526  WBC 9.0 8.4  HGB 13.2 11.7*  HCT 38.9 34.6*  PLT 237 209   BMET Recent Labs    10/21/17 0709 10/22/17 0526  NA 137 136  K 3.1* 3.2*  CL 99* 97*  CO2 29 28  GLUCOSE 128* 106*  BUN 6 6  CREATININE 0.59 0.50  CALCIUM 9.2 8.8*   PT/INR No results for input(s): LABPROT, INR in the last 72 hours. CMP     Component Value Date/Time   NA 136 10/22/2017 0526   NA 140 10/12/2017 1013   K 3.2 (L) 10/22/2017 0526   CL 97 (L) 10/22/2017 0526   CO2 28 10/22/2017 0526   GLUCOSE 106 (H) 10/22/2017 0526   BUN 6 10/22/2017 0526   BUN 10 10/12/2017 1013   CREATININE 0.50 10/22/2017 0526   CREATININE 0.68 11/05/2014 1017   CALCIUM 8.8 (L) 10/22/2017 0526   PROT 8.3 (H) 10/19/2017 2050   ALBUMIN 4.1 10/19/2017 2050   AST 27 10/19/2017 2050   ALT 23 10/19/2017 2050   ALKPHOS 73 10/19/2017 2050   BILITOT 0.8 10/19/2017 2050   GFRNONAA >60 10/22/2017 0526   GFRNONAA 86 11/05/2014 1017   GFRAA >60 10/22/2017 0526   GFRAA >89 11/05/2014 1017   Lipase     Component Value Date/Time   LIPASE 32 10/19/2017 2050       Studies/Results: Dg Chest Port 1 View  Result Date: 10/20/2017 CLINICAL DATA:  Central line placement. EXAM: PORTABLE CHEST 1 VIEW COMPARISON:  None. FINDINGS: RIGHT IJ central line appears adequately positioned with tip at the level of the mid/lower SVC. No pneumothorax seen. Lungs are clear. Enteric tube passes below the diaphragm. Cardiomegaly. IMPRESSION: 1. RIGHT IJ central line appears adequately positioned with tip at the level of the mid/lower SVC. No pneumothorax seen. 2. Enteric tube appears adequately positioned below the diaphragm. 3. Cardiomegaly. Electronically Signed   By: Franki Cabot M.D.   On: 10/20/2017 14:56    Anti-infectives: Anti-infectives (From admission, onward)   Start     Dose/Rate Route Frequency Ordered Stop   10/20/17 0800  vancomycin (VANCOCIN) IVPB 1000 mg/200 mL premix     1,000 mg 200 mL/hr over 60 Minutes Intravenous To [facility] 10/20/17 0354 10/20/17 1243       Assessment/Plan HTN - per primary service Hypokalemia - K 3.2, replace IV Delirium - patient with some  delirium yesterday, on precautions; appropriate and oriented when I saw her today   Incarcerated ventral hernia with PSBO - s/p primary hernia repair with small bowel resection 10/20/17 Dr. Brantley Stage - POD#2 - d/c foley - patient without abdominal pain, expected post-operative ileus - may have a few ice chips but would not start on CLD until passing flatus - mobilize, continue binder - PT/OT - drain with 90 cc serosanguinous output, continue drain - d/c PCA and can have prn fentanyl q2h and will put on scheduled IV tylenol  FEN: NPO with ice chips, IVF; dulcolax suppository  VTE: SCDs, lovenox ID: no  current abx  LOS: 2 days    Brigid Re , [facility] 10/22/2017, 10:31 AM Pager: [phone] Consults: [phone] Mon-Fri 7:00 am-4:30 pm Sat-Sun 7:00 am-11:30 am

## 2017-10-22 NOTE — Progress Notes (Signed)
   Subjective: Amanda Roman was seen in her bed this morning with sister at bedside. She states that she is still having pain, but it remains controlled with her current regimen. She states she has had some nausea starting last night. She denies any flatulence or bowl movements thus far. She remains tired, but was encourage to get out of bed as able.  Objective:  Vital signs in last 24 hours: Vitals:   10/21/17 2328 10/22/17 0408 10/22/17 0550 10/22/17 0804  BP:   (!) 175/69   Pulse:   79   Resp: 20 20 20 16   Temp:   99 F (37.2 C)   TempSrc:   Oral   SpO2: 90% 91% 97% 96%  Weight:      Height:       Physical Exam  Constitutional: She appears well-developed and well-nourished.  Tired appearing, elderly female NG Tube removed yesterdat  Cardiovascular: Normal rate, regular rhythm and intact distal pulses.  Systolic murmur  Pulmonary/Chest: Effort normal and breath sounds normal. No respiratory distress.  Abdominal: Soft.  Hypoactive bowl sounds Diffusely tender to palpation, lower quadrants > upper quadrants Dressing and abdominal binder in place JP Drain in place with serosanguinous drainage  Musculoskeletal: She exhibits no tenderness or deformity.  Skin: Skin is warm and dry.   Assessment/Plan: 77 yo F with a history of hypertension, GERD, osteoarthritis, and chronic venous insufficiency who presented with small bowel obstruction secondary to an incarcerated ventral hernia.  Partial small bowel obstruction (HCC) secondary to incarcerated ventral hernia: - 4/13 Open surgical reduction of incarcerated bowel and partial resection ischemic loops, then hernia repair, drain placed. (No signs of perforation) - Afebrile last 24hrs, WBC 8.4 - General surgery is following, appreciate their recommendations  - Fentanyl PCA and dilaudid 0.5 q4 h PRN; will likely need stool softeners - NG tube discontinued yesterday, nausea overnight but no vomiting - N flatus or bowel movements yet -  Discontinue Foley - NPO, NS 100 cc/hr - PT/OT Eval and Treat - Out of bed, Up in chair  HTN: Hypertensive to 170s overnight. Will continue her home antihypertensives once she is able to tolerate PO medications. Labetalol IV discontinued due to bradycardia - IV hydralazine PRN for BP >180/110 (either or) - Amlodipine 10mg  Daily and Carvedilol 25mg  BID, once she tolerates PO.  Hypokalemia: K 3.1 on 4/14 and received 5 runs IV K. - K 3.2 on 3/15  - 49mEq IV K x 5  Dispo: Anticipated discharge pending clinic course.   Neva Seat, MD 10/22/2017, 10:21 AM Pager: Pager# [phone]

## 2017-10-22 NOTE — Evaluation (Signed)
Physical Therapy Evaluation Patient Details Name: Amanda Roman MRN: [id number] DOB: [birth year] Today's Date: 10/22/2017   History of Present Illness  Amanda Roman is a very pleasant 77 year old woman with a history of hypertension, GERD, osteoarthritis, and chronic venous insufficiency who is here for management of a small bowel obstruction secondary to an incarcerated ventral hernia.  S/p Primary repair of incarcerated incisional hernia with small bowel resection on 10/20/17.  Clinical Impression  Pt was able to get up and walk a short distance around the room with RW.  She will need a youth RW next session as she is too short for the regular RW in her room.  She is limited by pain and post op weakness, but has been up and moving several times today.  She reports good family support and if this is the case would be appropriate for HHPT at discharge.   PT to follow acutely for deficits listed below.       Follow Up Recommendations Home health PT;Supervision for mobility/OOB    Equipment Recommendations  Rolling walker with 5" wheels;3in1 (PT)    Recommendations for Other Services   NA    Precautions / Restrictions Precautions Precautions: Fall;Other (comment)(abdominal) Precaution Comments: jp drain abdominal binder Restrictions Weight Bearing Restrictions: No      Mobility  Bed Mobility Overal bed mobility: Needs Assistance Bed Mobility: Rolling;Sidelying to Sit Rolling: Min assist Sidelying to sit: Min assist;HOB elevated       General bed mobility comments: Min assist to sit from sidelying, explained log roll and pillow to brace for comfort.   Transfers Overall transfer level: Needs assistance Equipment used: Rolling walker (2 wheeled) Transfers: Sit to/from Stand Sit to Stand: Min assist         General transfer comment: Min assist to stand from bed and BSc.   Ambulation/Gait Ambulation/Gait assistance: Min assist Ambulation Distance (Feet): 20 Feet(10'x2 with  seated rest break on BSC to urinate) Assistive device: Rolling walker (2 wheeled) Gait Pattern/deviations: Step-through pattern;Shuffle;Trunk flexed     General Gait Details: Pt with flexed, shuffling gait, frequent cues for upright posture.          Balance Overall balance assessment: Needs assistance Sitting-balance support: Feet supported;No upper extremity supported Sitting balance-Leahy Scale: Fair     Standing balance support: Bilateral upper extremity supported Standing balance-Leahy Scale: Poor                               Pertinent Vitals/Pain Pain Assessment: Faces Faces Pain Scale: Hurts little more Pain Location: abdomen Pain Descriptors / Indicators: Grimacing;Guarding Pain Intervention(s): Limited activity within patient's tolerance;Monitored during session;Repositioned    Home Living Family/patient expects to be discharged to:: Private residence Living Arrangements: Alone Available Help at Discharge: Family;Available 24 hours/day Type of Home: House Home Access: Stairs to enter Entrance Stairs-Rails: Right Entrance Stairs-Number of Steps: 2 per sister Home Layout: One level Home Equipment: Cane - single point;Walker - standard Additional Comments: pt and caregiver confirm pt used SPC at home and RW in community    Prior Function Level of Independence: Independent with assistive device(s)               Hand Dominance   Dominant Hand: Right    Extremity/Trunk Assessment   Upper Extremity Assessment Upper Extremity Assessment: Defer to OT evaluation    Lower Extremity Assessment Lower Extremity Assessment: Generalized weakness    Cervical / Trunk Assessment Cervical /  Trunk Assessment: Normal  Communication   Communication: No difficulties  Cognition Arousal/Alertness: Awake/alert Behavior During Therapy: WFL for tasks assessed/performed Overall Cognitive Status: Within Functional Limits for tasks assessed                                  General Comments: not specifically tested             Assessment/Plan    PT Assessment Patient needs continued PT services  PT Problem List Decreased strength;Decreased activity tolerance;Decreased balance;Decreased mobility;Decreased knowledge of use of DME;Decreased knowledge of precautions;Pain       PT Treatment Interventions DME instruction;Gait training;Stair training;Functional mobility training;Therapeutic activities;Balance training;Therapeutic exercise;Patient/family education    PT Goals (Current goals can be found in the Care Plan section)  Acute Rehab PT Goals Patient Stated Goal: "to get home" PT Goal Formulation: With patient Time For Goal Achievement: 11/05/17 Potential to Achieve Goals: Good    Frequency Min 3X/week           AM-PAC PT "6 Clicks" Daily Activity  Outcome Measure Difficulty turning over in bed (including adjusting bedclothes, sheets and blankets)?: Unable Difficulty moving from lying on back to sitting on the side of the bed? : Unable Difficulty sitting down on and standing up from a chair with arms (e.g., wheelchair, bedside commode, etc,.)?: Unable Help needed moving to and from a bed to chair (including a wheelchair)?: A Little Help needed walking in hospital room?: A Little Help needed climbing 3-5 steps with a railing? : A Lot 6 Click Score: 11    End of Session Equipment Utilized During Treatment: Other (comment)(abdominal binder) Activity Tolerance: Patient limited by pain Patient left: in chair;with call bell/phone within reach;with chair alarm set   PT Visit Diagnosis: Muscle weakness (generalized) (M62.81);Difficulty in walking, not elsewhere classified (R26.2);Pain Pain - Right/Left: (abdomen) Pain - part of body: (abdomen)    Time: 1744-1800 PT Time Calculation (min) (ACUTE ONLY): 16 min   Charges:         Wells Guiles B. Marcellas Marchant, PT, DPT [phone]   PT Evaluation $PT Eval Moderate  Complexity: 1 Mod     10/22/2017, 7:00 PM

## 2017-10-22 NOTE — Progress Notes (Signed)
Visited with patient.  Talked about Advance Directive which she already had on her tray table.  She said a family member is going to help her fill this out.  I shared to have nurse call chaplain when ready to sign and have it nortarized.     10/22/17 1100  Clinical Encounter Type  Visited With Patient  Visit Type Initial;Spiritual support  Referral From Physician  Consult/Referral To Chaplain  Spiritual Encounters  Spiritual Needs Ritual  Stress Factors  Patient Stress Factors None identified  Family Stress Factors None identified  Patient shared her concerns and we had prayer about feeling better and hoping to be able to get back to doing things she enjoys doing and for staff who care for her. Conard Novak, Chaplain

## 2017-10-22 NOTE — Progress Notes (Signed)
   I saw and examined the patient. I reviewed the resident's note and I agree with the resident's findings and plan as documented in the resident's note.  Post op day #2 with ileus related to mechanical SBO from incarcerated hernia. Patient looks to be struggling today, weak, having moderate nausea, no flatus yet, has not been out of bed, says she doesn't feel well. Greatly appreciate surgery consultation. We explained to daughter that post-op ileus is very common in these situations and time will tell. I encouraged out of bed activity, up to the chair, continue IV fluids while NPO, switch to D5 half NS.   Axel Filler, MD 10/22/2017, 12:33 PM

## 2017-10-23 LAB — BASIC METABOLIC PANEL
ANION GAP: 10 (ref 5–15)
CALCIUM: 8.9 mg/dL (ref 8.9–10.3)
CO2: 29 mmol/L (ref 22–32)
CREATININE: 0.5 mg/dL (ref 0.44–1.00)
Chloride: 100 mmol/L — ABNORMAL LOW (ref 101–111)
GFR calc Af Amer: >60 mL/min (ref >60)
GFR calc non Af Amer: >60 mL/min (ref >60)
GLUCOSE: 130 mg/dL — AB (ref 65–99)
Potassium: 3.1 mmol/L — ABNORMAL LOW (ref 3.5–5.1)
Sodium: 139 mmol/L (ref 135–145)

## 2017-10-23 LAB — CBC
HEMATOCRIT: 34.6 % — AB (ref 36.0–46.0)
Hemoglobin: 11.8 g/dL — ABNORMAL LOW (ref 12.0–15.0)
MCH: 31.1 pg (ref 26.0–34.0)
MCHC: 34.1 g/dL (ref 30.0–36.0)
MCV: 91.1 fL (ref 78.0–100.0)
Platelets: 223 10*3/uL (ref 150–400)
RBC: 3.8 MIL/uL — ABNORMAL LOW (ref 3.87–5.11)
RDW: 12.7 % (ref 11.5–15.5)
WBC: 7.8 10*3/uL (ref 4.0–10.5)

## 2017-10-23 LAB — GLUCOSE, CAPILLARY: Glucose-Capillary: 124 mg/dL — ABNORMAL HIGH (ref 65–99)

## 2017-10-23 LAB — MAGNESIUM: Magnesium: 1.7 mg/dL (ref 1.7–2.4)

## 2017-10-23 MED ORDER — POTASSIUM CHLORIDE 10 MEQ/50ML IV SOLN
10.0000 meq | INTRAVENOUS | Status: AC
  Administered 2017-10-23 (×6): 10 meq via INTRAVENOUS
  Filled 2017-10-23 (×6): qty 50

## 2017-10-23 MED ORDER — HYDRALAZINE HCL 20 MG/ML IJ SOLN
5.0000 mg | Freq: Four times a day (QID) | INTRAMUSCULAR | Status: DC | PRN
  Administered 2017-10-23 – 2017-10-24 (×2): 5 mg via INTRAVENOUS
  Filled 2017-10-23 (×2): qty 1

## 2017-10-23 NOTE — Progress Notes (Signed)
Physical Therapy Treatment Patient Details Name: Amanda Roman MRN: [id number] DOB: 1940/07/27 Today's Date: 10/23/2017    History of Present Illness Amanda Roman is a very pleasant 77 year old woman with a history of hypertension, GERD, osteoarthritis, and chronic venous insufficiency who is here for management of a small bowel obstruction secondary to an incarcerated ventral hernia.  S/p Primary repair of incarcerated incisional hernia with small bowel resection on 10/20/17.    PT Comments    Pt progressing with mobility, ambulated 40' with RW and min A. However, she is not at a level that would be safe to go home alone and be able to get around her house. Pt reports that she does not feel that she could manage at home. Discussed SNF and both pt and family agreeable to this for rehab before going home. Discussed with case management. PT will continue to follow.    Follow Up Recommendations  SNF;Supervision for mobility/OOB     Equipment Recommendations  Rolling walker with 5" wheels;3in1 (PT)    Recommendations for Other Services       Precautions / Restrictions Precautions Precautions: Fall;Other (comment) Precaution Comments: jp drain, abdominal binder Restrictions Weight Bearing Restrictions: No    Mobility  Bed Mobility Overal bed mobility: Needs Assistance Bed Mobility: Supine to Sit     Supine to sit: Min assist     General bed mobility comments: min HHA for trunk elevation from SL to sitting  Transfers Overall transfer level: Needs assistance Equipment used: Rolling walker (2 wheeled) Transfers: Sit to/from Stand Sit to Stand: Min guard         General transfer comment: vc's for hand placement. Youth RW obtained for her and she was able to safely stand to it better though still has a tendency to grab onto it  Ambulation/Gait Ambulation/Gait assistance: Min Wellsite geologist (Feet): 40 Feet Assistive device: Rolling walker (2 wheeled) Gait  Pattern/deviations: Trendelenburg;Step-through pattern;Trunk flexed Gait velocity: decreased Gait velocity interpretation: <1.31 ft/sec, indicative of household ambulator General Gait Details: pt begins to show trendelenberg gait pattern after 20' and ascribes to back and R hip pain.    Stairs             Wheelchair Mobility    Modified Rankin (Stroke Patients Only)       Balance Overall balance assessment: Needs assistance Sitting-balance support: Feet supported;No upper extremity supported Sitting balance-Leahy Scale: Fair     Standing balance support: Bilateral upper extremity supported Standing balance-Leahy Scale: Poor Standing balance comment: relies on RW                            Cognition Arousal/Alertness: Awake/alert Behavior During Therapy: WFL for tasks assessed/performed Overall Cognitive Status: Within Functional Limits for tasks assessed                                        Exercises General Exercises - Lower Extremity Ankle Circles/Pumps: AROM;Both;10 reps;Seated Quad Sets: AROM;Both;10 reps;Seated Gluteal Sets: AROM;Both;10 reps;Seated Heel Slides: AROM;Both;10 reps;Seated    General Comments General comments (skin integrity, edema, etc.): discussed discharge with pt and she reports she is nervous about going home alone. Discussed option of SNF and she felt that this would be much safer option before home      Pertinent Vitals/Pain Pain Assessment: Faces Faces Pain Scale: Hurts little more Pain Location:  back, R hip with ambulation Pain Descriptors / Indicators: Grimacing;Aching Pain Intervention(s): Limited activity within patient's tolerance;Monitored during session    Home Living                      Prior Function            PT Goals (current goals can now be found in the care plan section) Acute Rehab PT Goals Patient Stated Goal: "to get home" PT Goal Formulation: With patient Time For  Goal Achievement: 11/05/17 Potential to Achieve Goals: Good Progress towards PT goals: Progressing toward goals    Frequency    Min 3X/week      PT Plan Discharge plan needs to be updated    Co-evaluation              AM-PAC PT "6 Clicks" Daily Activity  Outcome Measure  Difficulty turning over in bed (including adjusting bedclothes, sheets and blankets)?: Unable Difficulty moving from lying on back to sitting on the side of the bed? : Unable Difficulty sitting down on and standing up from a chair with arms (e.g., wheelchair, bedside commode, etc,.)?: Unable Help needed moving to and from a bed to chair (including a wheelchair)?: A Little Help needed walking in hospital room?: A Little Help needed climbing 3-5 steps with a railing? : A Lot 6 Click Score: 11    End of Session Equipment Utilized During Treatment: Other (comment)(abdominal binder) Activity Tolerance: Patient tolerated treatment well Patient left: in chair;with call bell/phone within reach;with chair alarm set;with family/visitor present Nurse Communication: Mobility status PT Visit Diagnosis: Muscle weakness (generalized) (M62.81);Difficulty in walking, not elsewhere classified (R26.2);Pain Pain - Right/Left: Right Pain - part of body: Hip(back)     Time: 1539-1600 PT Time Calculation (min) (ACUTE ONLY): 21 min  Charges:  $Gait Training: 8-22 mins                    G Codes:       [place] 10/23/2017, 4:24 PM

## 2017-10-23 NOTE — Care Management Note (Signed)
Case Management Note  Patient Details  Name: Amanda Roman MRN: [id number] Date of Birth: [birth year]  Subjective/Objective:                    Action/Plan:  Discussed PT recommendations for HHPT, rolling walker and 3 in 1 with patient . Patient is in agreement. Confirmed face sheet information with patient. Provided list of home health agencies and left at bedside. Patient will discuss with her sister on which agency to pick.  Patient lives alone . Will follow up in morning.  Expected Discharge Date:                  Expected Discharge Plan:  [place]  In-House Referral:  NA  Discharge planning Services  CM Consult  Post Acute Care Choice:  Home Health, Durable Medical Equipment Choice offered to:  Patient  DME Arranged:  3-N-1, Walker rolling DME Agency:  [place]:  PT [facility] Agency:     Status of Service:  In process, will continue to follow  If discussed at Long Length of Stay Meetings, dates discussed:    Additional Comments:  Marilu Favre, RN 10/23/2017, 10:51 AM

## 2017-10-23 NOTE — Plan of Care (Signed)
  Problem: Education: Goal: Knowledge of General Education information will improve Outcome: Progressing Note:  POC reviewed with pt.   

## 2017-10-23 NOTE — Progress Notes (Signed)
Paged and informed patient would like to change Code status to FULL CODE. Visited patient in her room and discussed this with the patient and family who were present. Patient confirmed she would like to be FULL CODE and this has be updated in her chart.  Pearson Grippe, DO IM PGY-1 Pager: [phone]

## 2017-10-23 NOTE — Progress Notes (Addendum)
   Subjective: Ms. Chawla was seen resting in her bed this morning. She states that her pain is persistent, but remains well controlled on her current regimen. She endorse some nausea overnight, but states this is controlled with her PRN Zofran. She also states that she has passed some gas, but denies any bowl movements. She was out of bed, in her chair, and working with PT most of the day yesterday. She seems more alert today and her sister agrees. She has no other questions or concerns this morning.  Objective:  Vital signs in last 24 hours: Vitals:   10/22/17 2037 10/22/17 2118 10/23/17 0418 10/23/17 0456  BP: (!) 183/88 (!) 188/85 (!) 192/69 (!) 182/87  Pulse: 81 79 88   Resp:   16   Temp: 99.9 F (37.7 C)  99.1 F (37.3 C)   TempSrc: Oral  Oral   SpO2: 100%  97%   Weight:      Height:       Physical Exam  Constitutional: She appears well-developed and well-nourished.  Elderly female  Cardiovascular: Normal rate, regular rhythm and intact distal pulses.  Systolic murmur  Pulmonary/Chest: Effort normal and breath sounds normal. No respiratory distress.  Abdominal: Soft.  Hypoactive bowlsounds Tender to palpation Lower Quadrants > Upper Quadrants  Musculoskeletal: She exhibits no edema or deformity.  Neurological: She is alert.  Skin: Skin is warm and dry.   Assessment/Plan: 77 yo F with a history of hypertension, GERD, osteoarthritis, and chronic venous insufficiency who presented with small bowel obstruction secondary to an incarcerated ventral hernia.  Partial small bowel obstruction (HCC) secondary to incarcerated ventral hernia: Improving > 4/13 Open surgical reduction of incarcerated bowel and partial resection ischemic loops, then hernia repair, drain placed. (No signs of perforation) > Reports flatus since last visit, no bowel movements yet > PT/OT Recommend Home Health; 3 in 1 commode, Rolling Walker > Afebrile, WBC 7.8 - General surgery is following, appreciate  their recommendations  - Fentanyl 12.69mcg q2h PRN; Acetaminophen 1,000mg  q8h - Out of bed, Up in chair Daily - NPO, NS 100 cc/hr  HTN: Hypertensive here off home meds. Will continue her home antihypertensives once she is able to tolerate PO medications. Labetalol IV discontinued due to bradycardia. - Amlodipine 10mg  Daily and Carvedilol 25mg  BID, once she tolerates PO. - IV hydralazine PRN for BP >180/110 (either or)  Hypokalemia: Remains hypokalemic despite daily repletion. K 3.1 on 4/16. - 86mEq IV K x 6 - Check Mg, Replete  FEN: NPO VTE ppx: Lovenox, SCDs Code Status: FULL  Dispo: Anticipated discharge pending clinical course.   Neva Seat, MD 10/23/2017, 10:30 AM Pager: Pager# [phone]

## 2017-10-23 NOTE — Consult Note (Signed)
            [facility] CM Primary Care Navigator  10/23/2017  Amanda Roman 1940/09/27 [id number]  Seen patient at the bedsideto identify possible discharge needs. Patientreports that "stomach was hurting, had nausea and vomiting" which had led to this admission/ surgery. (Partial small bowel obstruction secondary to incarcerated ventral hernia- status post ventral hernia repair)   PatientendorsesDr. Oval Linsey with [place] primary care provider.   [facility] [employer] [address] [facility] service to obtainmedications without difficulty.   Patientstates managingherown medications at home straight out of the containers.  Patient reports using [facility] for transportation and her sister Amanda Roman) is able to providetransportation to her doctors'appointments after discharge.  Patient mentioned that she lives alone but her sister (who lives close by) has been assisting with her needs at home.   Anticipated discharge plan ishomewith home health services per therapy recommendation.  Patient voiced understanding to call primary care provider's office whenshereturns home, for a post discharge follow-up appointment within1- 2 weeksor sooner if needs arise.Patient letter (with PCP's contact number) was provided asareminder.  Explained topatientregardingTHN CM services available for health managementand resources at homebut sheindicated having no needs or concernsat this time. Patientexpressedunderstandingto seek referral from primary care provider to [facility] care management ifnecessaryand deemed appropriate for anyservices in the future.  [facility] care management information provided for future needs thatshemay have.  Patienthad verbally agreedand optedforEMMIcalls tofollowup with her recoveryat home.  Referral made for [facility] General calls after discharge.   For additional  questions please contact:  Edwena Felty A. Quin Mathenia, BSN, RN-BC [facility] PRIMARY CARE Navigator Cell: [phone]

## 2017-10-23 NOTE — Consult Note (Addendum)
[name] requests meeting in patient room to complete HCPOA paperwork.  Palliative Medicine CNS Regulatory affairs officer) conducted an open forum today in the [employer].  This encounter precipitated the request by Amanda Roman sister to meet in room [phone]  Upon entering the room Amanda Roman was in bed, opened her eyes to name.  She was assessed to be oriented to name, DOB, place and was able to give the time of day when referred to the clock.  Name and DOB verified by ID bracelet.  Preceded to discuss the purpose of visit and explain the [facility] paperwork and process.  Amanda Roman verbalized that she indeed wanted to name her sister [name] as her HCPOA.  We discussed that the designation of HCPOA is for the designee to inform medical staff related to medical decision making should the patient (Amanda Roman) be unable to speak on her own behalf.  The paper work was completed, notarized and copy placed on the shadow chart.  Secondly, Amanda Roman asked me to explain meaning of the purple band attached around her left wrist.  DNR designation was fully explained to her.  She states that when she was admitted through the ER on 10/19/17 she was confused, very sick and did not understand any discussion regarding CPR or mechanical ventilation.  She clearly verbalized that she wishes that CPR and/or mechanical ventilation employed on her should her heart stop and or if she ceases to breath.  We discussed the possibility that these efforts may not be successful if attempted due to medical deterioration.  She verbalized understanding and requested that her code status be made full code.  Sister, [name] and Amanda Roman understand that at such a point, should this happen, Garth Schlatter would then become the decision maker.  The conversation was verbalized and witnessed by primary RN, Dominque, who paged the attending physician for code status order change.  Charge nurse notified of conversation as well.    Please do not  hesitate to call with any questions.  Kizzie Fantasia, MSN, RN-BC Palliative Clinical Specialist Cell [phone]

## 2017-10-23 NOTE — Progress Notes (Addendum)
  Date: 10/23/2017  Patient name: Amanda Roman  Medical record number: [id number]  Date of birth: 1941/02/28   I have seen and evaluated this patient and I have discussed the plan of care with the house staff. Please see their note for complete details. I concur with their findings with the following additions/corrections:   77 yo woman admitted with SBO due to incarcerated ventral hernia no POD 3 after open surgical reduction and partial bowel resection. Appears improved today, having some nausea but no vomiting, passing some gas, no BM yet. Was able to be up and about yesterday, encouraged continued OOB today for improved bowel function. On exam, incision looks good, JP drain with small amount of serosanguinous drainage, abdomen mildly tender and distended with tympanic percussion and minimal bowel sounds. Likely postop ileus as expected, hopefully continued improvement in bowel function over the next few days and will resume diet and oral meds when able. Will replete hypokalemia and hypomagnesemia.   Lenice Pressman, M.D., Ph.D. 10/23/2017, 5:20 PM

## 2017-10-23 NOTE — Progress Notes (Signed)
[facility] Progress Note  3 Days Post-Op  Subjective: CC:  Pain controlled. Still endorses nausea. Denies flatus or BM. States she was out of bed most of the day yesterday and working with PT/OT as well as sat up in chair.   Objective: Vital signs in last 24 hours: Temp:  [98.6 F (37 C)-99.9 F (37.7 C)] 99.1 F (37.3 C) (04/16 0418) Pulse Rate:  [72-88] 88 (04/16 0418) Resp:  [16] 16 (04/16 0418) BP: (169-192)/(69-88) 182/87 (04/16 0456) SpO2:  [97 %-100 %] 97 % (04/16 0418) Last BM Date: 10/19/17  Intake/Output from previous day: 04/15 0701 - 04/16 0700 In: 1783.3 [I.V.:1483.3; IV Piggyback:300] Out: 265 [Urine:200; Drains:65] Intake/Output this shift: No intake/output data recorded.  PE: Gen:  Alert, NAD, pleasant Abd: Soft, appropriately tender, midline incision c/d/i w/ staples in place, JP drain with SS output (65 cc/24h), +BS Skin: warm and dry, no rashes, SCD's in place Psych: A&Ox3   Lab Results:  Recent Labs    10/22/17 0526 10/23/17 0740  WBC 8.4 7.8  HGB 11.7* 11.8*  HCT 34.6* 34.6*  PLT 209 223   BMET Recent Labs    10/21/17 0709 10/22/17 0526  NA 137 136  K 3.1* 3.2*  CL 99* 97*  CO2 29 28  GLUCOSE 128* 106*  BUN 6 6  CREATININE 0.59 0.50  CALCIUM 9.2 8.8*   PT/INR No results for input(s): LABPROT, INR in the last 72 hours. CMP     Component Value Date/Time   NA 136 10/22/2017 0526   NA 140 10/12/2017 1013   K 3.2 (L) 10/22/2017 0526   CL 97 (L) 10/22/2017 0526   CO2 28 10/22/2017 0526   GLUCOSE 106 (H) 10/22/2017 0526   BUN 6 10/22/2017 0526   BUN 10 10/12/2017 1013   CREATININE 0.50 10/22/2017 0526   CREATININE 0.68 11/05/2014 1017   CALCIUM 8.8 (L) 10/22/2017 0526   PROT 8.3 (H) 10/19/2017 2050   ALBUMIN 4.1 10/19/2017 2050   AST 27 10/19/2017 2050   ALT 23 10/19/2017 2050   ALKPHOS 73 10/19/2017 2050   BILITOT 0.8 10/19/2017 2050   GFRNONAA >60 10/22/2017 0526   GFRNONAA 86 11/05/2014 1017   GFRAA >60  10/22/2017 0526   GFRAA >89 11/05/2014 1017   Lipase     Component Value Date/Time   LIPASE 32 10/19/2017 2050    Studies/Results: No results found.  Anti-infectives: Anti-infectives (From admission, onward)   Start     Dose/Rate Route Frequency Ordered Stop   10/20/17 0800  vancomycin (VANCOCIN) IVPB 1000 mg/200 mL premix     1,000 mg 200 mL/hr over 60 Minutes Intravenous To ShortStay Surgical 10/20/17 0354 10/20/17 1243     Assessment/Plan HTN - per primary service Hypokalemia - K 3.2 on 4/15, todays labs pending  Delirium -   Incarcerated ventral hernia with PSBO - s/p primary hernia repair with small bowel resection 10/20/17 Dr. Brantley Stage - POD#3 - expected post-operative ileus - continue ice chips and await further bowel function - mobilize, continue binder - PT/OT - drain with 65 cc serosanguinous output, continue drain - IS  FEN: NPO with ice chips, IVF VTE: SCDs, lovenox ID: no current abx  Dispo: post-op ileus, mobilize and await bowel function   LOS: 3 days    Jill Alexanders , [facility] 10/23/2017, 9:54 AM Pager: [phone] Consults: [phone] Mon-Fri 7:00 am-4:30 pm Sat-Sun 7:00 am-11:30 am

## 2017-10-23 NOTE — Progress Notes (Signed)
Pt. nauseated- too early for Zofran; Dr. Reesa Chew informed

## 2017-10-24 LAB — BASIC METABOLIC PANEL
Anion gap: 7 (ref 5–15)
BUN: <5 mg/dL — ABNORMAL LOW (ref 6–20)
CO2: 30 mmol/L (ref 22–32)
Calcium: 9 mg/dL (ref 8.9–10.3)
Chloride: 101 mmol/L (ref 101–111)
Creatinine, Ser: 0.49 mg/dL (ref 0.44–1.00)
GFR calc Af Amer: >60 mL/min (ref >60)
GFR calc non Af Amer: >60 mL/min (ref >60)
Glucose, Bld: 123 mg/dL — ABNORMAL HIGH (ref 65–99)
Potassium: 3.2 mmol/L — ABNORMAL LOW (ref 3.5–5.1)
Sodium: 138 mmol/L (ref 135–145)

## 2017-10-24 LAB — GLUCOSE, CAPILLARY: GLUCOSE-CAPILLARY: 120 mg/dL — AB (ref 65–99)

## 2017-10-24 MED ORDER — CLONIDINE HCL 0.2 MG PO TABS
0.3000 mg | ORAL_TABLET | Freq: Two times a day (BID) | ORAL | Status: DC
  Administered 2017-10-24 – 2017-10-26 (×5): 0.3 mg via ORAL
  Filled 2017-10-24 (×5): qty 1

## 2017-10-24 MED ORDER — MAGNESIUM SULFATE 2 GM/50ML IV SOLN
2.0000 g | Freq: Once | INTRAVENOUS | Status: AC
  Administered 2017-10-24: 2 g via INTRAVENOUS
  Filled 2017-10-24: qty 50

## 2017-10-24 MED ORDER — AMLODIPINE BESYLATE 10 MG PO TABS
10.0000 mg | ORAL_TABLET | Freq: Every day | ORAL | Status: DC
  Administered 2017-10-24 – 2017-10-26 (×3): 10 mg via ORAL
  Filled 2017-10-24 (×3): qty 1

## 2017-10-24 MED ORDER — POTASSIUM CHLORIDE 10 MEQ/50ML IV SOLN
10.0000 meq | INTRAVENOUS | Status: AC
  Administered 2017-10-24 (×6): 10 meq via INTRAVENOUS
  Filled 2017-10-24 (×6): qty 50

## 2017-10-24 MED ORDER — SODIUM CHLORIDE 0.9% FLUSH: 10.0000 mL | INTRAVENOUS | Status: DC | PRN

## 2017-10-24 NOTE — Care Management Important Message (Signed)
Important Message  Patient Details  Name: VIANNE GRIESHOP MRN: [id number] Date of Birth: 21-Apr-1941   Medicare Important Message Given:  Yes    Tovah Slavick [address] 10/24/2017, 9:56 AM

## 2017-10-24 NOTE — Progress Notes (Signed)
   Subjective: Amanda Roman was seen resting in her bedside chair this morning. She state that she if feeling pretty good today and that her pain is slowing improving and remains controlled on her current regimen. She has continued to work on getting out of bed and working with PT. She states that she has continued to have flatus and has had 2 bowl movements in the past 24hrs. She has no other questions or concerns this morning.  Objective:  Vital signs in last 24 hours: Vitals:   10/23/17 2103 10/23/17 2328 10/24/17 0605 10/24/17 1152  BP: (!) 170/70 (!) 158/68 (!) 182/81 (!) 163/73  Pulse:  86 93 90  Resp:    16  Temp:   99.1 F (37.3 C) 99.3 F (37.4 C)  TempSrc:   Oral Oral  SpO2:   100% 100%  Weight:      Height:       Physical Exam  Constitutional: She appears well-developed and well-nourished. No distress.  Elderly female  Cardiovascular: Normal rate, regular rhythm and intact distal pulses.  Systolic murmur  Pulmonary/Chest: Effort normal and breath sounds normal. No respiratory distress.  Abdominal: Soft.  Bowl Sounds present Tender to palpation Lower Quadrants > Upper Quadrants Serosanguinous drainage in JP Drain  Musculoskeletal: She exhibits edema. She exhibits no deformity.  Nonpitting Edema  Neurological: She is alert.  Skin: Skin is warm and dry.   Assessment/Plan: 77 yo F with a history of hypertension, GERD, osteoarthritis, and chronic venous insufficiency who presented with small bowel obstruction secondary to an incarcerated ventral hernia.  Partial small bowel obstruction (HCC) secondary to incarcerated ventral hernia: Improving > 4/13 Open surgical reduction of incarcerated bowel and partial resection ischemic loops, then hernia repair, drain placed. (No signs of perforation) > Reports flatus since last visit, no bowel movements yet > PT/OT Recommend Home Health; 3 in 1 commode, Rolling Walker > Afebrile, WBC 7.8 - General surgery is following, appreciate  their recommendations  - Acetaminophen 1,000mg  q8h - Fentanyl 12.55mcg q2h PRN (Patient has not had to use this) - Out of bed, Up in chair Daily - Advance to clear liquid diet  HTN: Hypertensive here off home meds. Able to tolerate PO starting today. - Restarting home Amlodipine 10mg  Daily, Clonidine 0.3mg  BID - Holding Carvedilol 25mg  BID, Lasix 40mg  Daily - IV hydralazine PRN for BP >180/110 (either or)  Hypokalemia: Remains hypokalemic despite daily repletion. K 3.3 on 4/16. Mg 1.7 on 4/16 - 2g Mg IV - 43mEq IV K x 6 - AM BMP and Mg  FEN: Clear VTE ppx: Lovenox Code Status: FULL  Dispo: Anticipated discharge pending clinical course.   Amanda Seat, MD 10/24/2017, 1:41 PM Pager: Pager# [phone]

## 2017-10-24 NOTE — Progress Notes (Signed)
[facility] Progress Note  4 Days Post-Op  Subjective: CC:  Denies pain. Nausea resolved. Sitting up on bedside commode. Having flatus and had 2 BMs. Mobilizing with walker and getting up to chair.  Objective: Vital signs in last 24 hours: Temp:  [99.1 F (37.3 C)-99.9 F (37.7 C)] 99.1 F (37.3 C) (04/17 0605) Pulse Rate:  [85-93] 93 (04/17 0605) Resp:  [17-20] 17 (04/16 2041) BP: (158-195)/(66-88) 182/81 (04/17 0605) SpO2:  [93 %-100 %] 100 % (04/17 0605) Last BM Date: 10/24/17  Intake/Output from previous day: 04/16 0701 - 04/17 0700 In: 2698.3 [I.V.:2698.3] Out: 37 [Urine:450; Drains:40] Intake/Output this shift: Total I/O In: -  Out: 150 [Urine:150]  PE: Gen:  Alert, NAD, pleasant Abd: Soft, appropriately tender, midline incision c/d/i w/ staples in place, JP drain with SS output, +BS Skin: warm and dry, no rashes, SCD's in place Psych: A&Ox3     Lab Results:  Recent Labs    10/22/17 0526 10/23/17 0740  WBC 8.4 7.8  HGB 11.7* 11.8*  HCT 34.6* 34.6*  PLT 209 223   BMET Recent Labs    10/22/17 0526 10/23/17 0740  NA 136 139  K 3.2* 3.1*  CL 97* 100*  CO2 28 29  GLUCOSE 106* 130*  BUN 6 <5*  CREATININE 0.50 0.50  CALCIUM 8.8* 8.9   PT/INR No results for input(s): LABPROT, INR in the last 72 hours. CMP     Component Value Date/Time   NA 139 10/23/2017 0740   NA 140 10/12/2017 1013   K 3.1 (L) 10/23/2017 0740   CL 100 (L) 10/23/2017 0740   CO2 29 10/23/2017 0740   GLUCOSE 130 (H) 10/23/2017 0740   BUN <5 (L) 10/23/2017 0740   BUN 10 10/12/2017 1013   CREATININE 0.50 10/23/2017 0740   CREATININE 0.68 11/05/2014 1017   CALCIUM 8.9 10/23/2017 0740   PROT 8.3 (H) 10/19/2017 2050   ALBUMIN 4.1 10/19/2017 2050   AST 27 10/19/2017 2050   ALT 23 10/19/2017 2050   ALKPHOS 73 10/19/2017 2050   BILITOT 0.8 10/19/2017 2050   GFRNONAA >60 10/23/2017 0740   GFRNONAA 86 11/05/2014 1017   GFRAA >60 10/23/2017 0740   GFRAA >89  11/05/2014 1017   Lipase     Component Value Date/Time   LIPASE 32 10/19/2017 2050       Studies/Results: No results found.  Anti-infectives: Anti-infectives (From admission, onward)   Start     Dose/Rate Route Frequency Ordered Stop   10/20/17 0800  vancomycin (VANCOCIN) IVPB 1000 mg/200 mL premix     1,000 mg 200 mL/hr over 60 Minutes Intravenous To ShortStay Surgical 10/20/17 0354 10/20/17 1243     Assessment/Plan HTN - per primary service Hypokalemia - 3.1 yesterday, BMET pending today Delirium   Incarcerated ventral hernia with PSBO - s/p primary hernia repair with small bowel resection 10/20/17 Dr. Brantley Stage - POD#4 - post-op ileus resolving, now having bowel function - start clear liquids - mobilize, continue binder - PT/OT - drain output 40 cc/24h SS, continue drain - IS  FEN: clear liquids  VTE: SCDs, lovenox ID: no current abx  Dispo: start clear liquids and continue to mobilize     LOS: 4 days    Jill Alexanders , [facility] 10/24/2017, 9:36 AM Pager: [phone] Consults: [phone] Mon-Fri 7:00 am-4:30 pm Sat-Sun 7:00 am-11:30 am

## 2017-10-24 NOTE — Clinical Social Work Note (Signed)
Clinical Social Work Assessment  Patient Details  Name: Amanda Roman MRN: [id number] Date of Birth: 01/09/1941  Date of referral:  10/24/17               Reason for consult:  Facility Placement, Discharge Planning                Permission sought to share information with:  Facility Sport and exercise psychologist, Family Supports Permission granted to share information::  Yes, Verbal Permission Granted  Name::     Patent examiner::  SNFs  Relationship::  sister  Contact Information:     Housing/Transportation Living arrangements for the past 2 months:  Apartment Source of Information:  Patient Patient Interpreter Needed:  None Criminal Activity/Legal Involvement Pertinent to Current Situation/Hospitalization:  No - Comment as needed Significant Relationships:  Siblings, [employer] Lives with:  Self Do you feel safe going back to the place where you live?  Yes("after rehab") Need for family participation in patient care:  Yes (Comment)  Care giving concerns: Pt lives in an apartment by herself, she does not currently have 24/7 caregivers. SNF recommended before returning home.    Social Worker assessment / plan:CSW met with pt at bedside, initially she was going to return home with home health however would now like to go to a SNF prior to returning home. Pt states that she lives by herself, and has sisters that help her out. Pt is one of 8 children and gave permission for CSW to speak with sister [name]. Pt states she has never been to a nursing home before and would like to see her options to make the decision. Pt had no further concerns or complaints for this Probation officer. CSW will send out fl2 and present pt with options.   Employment status:  Retired Nurse, adult PT Recommendations:  [place], [place] / Referral to community resources:  [place]  Patient/Family's Response to care: Pt calm and quiet,  amenable to SNF recommendation. States understanding of CSW role and SNF process.  Patient/Family's Understanding of and Emotional Response to Diagnosis, Current Treatment, and Prognosis:  Pt states understanding of her diagnosis, current treatment and prognosis. Pt states that "I feel fine," and expresses reasonable understanding of her limitations and need for care greater than she could provide to herself at home.   Emotional Assessment Appearance:  Appears stated age Attitude/Demeanor/Rapport:  Gracious, Engaged Affect (typically observed):  Accepting, Adaptable, Appropriate, Calm, Pleasant, Quiet Orientation:  Oriented to Self, Oriented to Place, Oriented to  Time, Oriented to Situation Alcohol / Substance use:  Not Applicable Psych involvement (Current and /or in the community):  No (Comment)  Discharge Needs  Concerns to be addressed:  Care Coordination, Discharge Planning Concerns Readmission within the last 30 days:  No Current discharge risk:  Lives alone, Physical Impairment, Dependent with Mobility Barriers to Discharge:  Continued Medical Work up, [employer], [place] 10/24/2017, 9:53 AM

## 2017-10-24 NOTE — Progress Notes (Addendum)
  Date: 10/24/2017  Patient name: Amanda Roman  Medical record number: [id number]  Date of birth: 04-23-1941   I have seen and evaluated this patient and I have discussed the plan of care with the house staff. Please see their note for complete details. I concur with their findings with the following additions/corrections:   Improving, had bowel movement today, advancing diet. Pain well-controlled without opioids. Pathology returned benign.  Lenice Pressman, M.D., Ph.D. 10/24/2017, 3:22 PM

## 2017-10-24 NOTE — NC FL2 (Addendum)
[place] LEVEL OF CARE SCREENING TOOL     IDENTIFICATION  Patient Name: Amanda Roman Birthdate: Oct 18, 1940 Sex: female Admission Date (Current Location): 10/19/2017  [facility] [place] and [place] Number:  Herbalist and Address:  [facility], [place] [address] [place], [place] [address]      Provider Number: [id number]  Attending Physician Name and Address:  Oda Kilts, MD  Relative Name and Phone Number:  Edsel Petrin, sister, [phone]    Current Level of Care: Hospital Recommended Level of Care: [place] Prior Approval Number:    Date Approved/Denied:   PASRR Number:   [id number]   Discharge Plan: SNF    Current Diagnoses: Patient Active Problem List   Diagnosis Date Noted  . Hypomagnesemia 10/23/2017  . Incarcerated hernia of abdominal cavity 10/22/2017  . Hypokalemia 10/22/2017  . Partial small bowel obstruction ([place]) 10/20/2017  . Leg ulcer, left ([place]) 08/23/2017  . Chronic venous insufficiency 05/02/2012  . Hemorrhoids without complication 84/69/6295  . Healthcare maintenance 05/11/2011  . Tubular adenoma of colon 07/18/2006  . Osteopenia 07/18/2006  . Morbid obesity with BMI of 40.0-44.9, adult ([place]) 05/03/2006  . Essential hypertension 05/03/2006  . Degenerative joint disease involving multiple joints 05/03/2006    Orientation RESPIRATION BLADDER Height & Weight     Self, Time, Situation, Place  Normal Incontinent Weight: 165 lb 9.1 oz (75.1 kg) Height:  5' (152.4 cm)  BEHAVIORAL SYMPTOMS/MOOD NEUROLOGICAL BOWEL NUTRITION STATUS      Continent Diet(see discharge summary)  AMBULATORY STATUS COMMUNICATION OF NEEDS Skin   Limited Assist Verbally Surgical wounds(left lateral leg wound, left abdominal incision with staples, left abdominal jp drain 59fr)                       Personal Care Assistance Level of Assistance  Bathing, Feeding, Dressing Bathing Assistance: Limited  assistance Feeding assistance: Independent Dressing Assistance: Limited assistance     Functional Limitations Info  Sight, Hearing, Speech Sight Info: Adequate Hearing Info: Adequate(a little hard of hearing) Speech Info: Adequate    SPECIAL CARE FACTORS FREQUENCY  PT (By licensed PT), OT (By licensed OT)     PT Frequency: 3x week OT Frequency: 2x week            Contractures Contractures Info: Not present    Additional Factors Info  Code Status, Allergies Code Status Info: Full Code Allergies Info: ACE INHIBITORS, PENICILLINS, ACETAMINOPHEN-CODEINE, CODEINE            Current Medications (10/24/2017):  This is the current hospital active medication list Current Facility-Administered Medications  Medication Dose Route Frequency Provider Last Rate Last Dose  . amLODipine (NORVASC) tablet 10 mg  10 mg Oral Daily Ledell Noss, MD   10 mg at 10/24/17 1158  . bupivacaine liposome (EXPAREL) 1.3 % injection 266 mg  20 mL Infiltration Once Cornett, Thomas, MD      . cloNIDine (CATAPRES) tablet 0.3 mg  0.3 mg Oral BID Ledell Noss, MD   0.3 mg at 10/24/17 1158  . dextrose 5 %-0.45 % sodium chloride infusion   Intravenous Continuous Neva Seat, MD 100 mL/hr at 10/24/17 0615    . enoxaparin (LOVENOX) injection 40 mg  40 mg Subcutaneous Q24H Ileana Roup, MD   40 mg at 10/24/17 1158  . fentaNYL (SUBLIMAZE) injection 12.5 mcg  12.5 mcg Intravenous Q2H PRN Rayburn, Kelly A, PA-C      . hydrALAZINE (APRESOLINE)  injection 5 mg  5 mg Intravenous Q6H PRN Thomasene Ripple, MD   5 mg at 10/24/17 0614  . mupirocin ointment (BACTROBAN) 2 % 1 application  1 application Nasal BID Erroll Luna, MD   1 application at 61/68/37 1158  . ondansetron (ZOFRAN) injection 4 mg  4 mg Intravenous Q4H PRN Lorella Nimrod, MD   4 mg at 10/23/17 1242   Or  . ondansetron (ZOFRAN) tablet 4 mg  4 mg Oral Q6H PRN Lorella Nimrod, MD      . potassium chloride 10 mEq in 50 mL *CENTRAL LINE* IVPB  10 mEq  Intravenous Q1 Hr x 6 Neva Seat, MD      . sodium chloride flush (NS) 0.9 % injection 10-40 mL  10-40 mL Intracatheter PRN Oda Kilts, MD         Discharge Medications: Please see discharge summary for a list of discharge medications.  Relevant Imaging Results:  Relevant Lab Results:   Additional Information [id number] [place], [place]

## 2017-10-25 LAB — BASIC METABOLIC PANEL
Anion gap: 5 (ref 5–15)
BUN: <5 mg/dL — ABNORMAL LOW (ref 6–20)
CO2: 29 mmol/L (ref 22–32)
Calcium: 8.4 mg/dL — ABNORMAL LOW (ref 8.9–10.3)
Chloride: 103 mmol/L (ref 101–111)
Creatinine, Ser: 0.61 mg/dL (ref 0.44–1.00)
GFR calc Af Amer: >60 mL/min (ref >60)
GFR calc non Af Amer: >60 mL/min (ref >60)
Glucose, Bld: 118 mg/dL — ABNORMAL HIGH (ref 65–99)
Potassium: 3.6 mmol/L (ref 3.5–5.1)
Sodium: 137 mmol/L (ref 135–145)

## 2017-10-25 LAB — GLUCOSE, CAPILLARY: Glucose-Capillary: 110 mg/dL — ABNORMAL HIGH (ref 65–99)

## 2017-10-25 MED ORDER — CARVEDILOL 25 MG PO TABS
25.0000 mg | ORAL_TABLET | Freq: Two times a day (BID) | ORAL | Status: DC
  Administered 2017-10-25 – 2017-10-26 (×2): 25 mg via ORAL
  Filled 2017-10-25 (×2): qty 1

## 2017-10-25 NOTE — Progress Notes (Signed)
Physical Therapy Treatment Patient Details Name: Amanda Roman MRN: [id number] DOB: Jun 03, 1941 Today's Date: 10/25/2017    History of Present Illness Amanda Roman is a very pleasant 77 year old woman with a history of hypertension, GERD, osteoarthritis, and chronic venous insufficiency who is here for management of a small bowel obstruction secondary to an incarcerated ventral hernia.  S/p Primary repair of incarcerated incisional hernia with small bowel resection on 10/20/17.    PT Comments    Pt received in chair, pleasant and agreeable to participation in therapy. Assisted pt in the bathroom prior to ambulation in hallway. She was dependent with hygiene following BM. Pt positioned in recliner with feet elevated at end of session.    Follow Up Recommendations  SNF;Supervision for mobility/OOB     Equipment Recommendations  Rolling walker with 5" wheels;3in1 (PT)    Recommendations for Other Services       Precautions / Restrictions Precautions Precautions: Fall;Other (comment) Precaution Comments: jp drain, abdominal binder Restrictions Weight Bearing Restrictions: No    Mobility  Bed Mobility               General bed mobility comments: Pt received in chair.  Transfers Overall transfer level: Needs assistance Equipment used: Rolling walker (2 wheeled) Transfers: Sit to/from Stand Sit to Stand: Min guard         General transfer comment: verbal cues for hand placement  Ambulation/Gait Ambulation/Gait assistance: Min guard Ambulation Distance (Feet): 100 Feet Assistive device: Rolling walker (2 wheeled) Gait Pattern/deviations: Step-through pattern;Decreased stride length Gait velocity: decreased Gait velocity interpretation: <1.31 ft/sec, indicative of household ambulator General Gait Details: verbal cues for posture   Stairs             Wheelchair Mobility    Modified Rankin (Stroke Patients Only)       Balance   Sitting-balance  support: Feet supported;No upper extremity supported Sitting balance-Leahy Scale: Fair     Standing balance support: Bilateral upper extremity supported Standing balance-Leahy Scale: Poor Standing balance comment: relies on RW                            Cognition Arousal/Alertness: Awake/alert Behavior During Therapy: WFL for tasks assessed/performed Overall Cognitive Status: Within Functional Limits for tasks assessed                                        Exercises      General Comments        Pertinent Vitals/Pain Pain Assessment: Faces Faces Pain Scale: Hurts a little bit Pain Location: sx site Pain Descriptors / Indicators: Grimacing;Sore Pain Intervention(s): Monitored during session;Repositioned    Home Living                      Prior Function            PT Goals (current goals can now be found in the care plan section) Acute Rehab PT Goals Patient Stated Goal: rehab then home PT Goal Formulation: With patient Time For Goal Achievement: 11/05/17 Potential to Achieve Goals: Good Progress towards PT goals: Progressing toward goals    Frequency    Min 3X/week      PT Plan Current plan remains appropriate    Co-evaluation              AM-PAC PT "6 Clicks"  Daily Activity  Outcome Measure  Difficulty turning over in bed (including adjusting bedclothes, sheets and blankets)?: Unable Difficulty moving from lying on back to sitting on the side of the bed? : Unable Difficulty sitting down on and standing up from a chair with arms (e.g., wheelchair, bedside commode, etc,.)?: A Lot Help needed moving to and from a bed to chair (including a wheelchair)?: A Little Help needed walking in hospital room?: A Little Help needed climbing 3-5 steps with a railing? : A Lot 6 Click Score: 12    End of Session Equipment Utilized During Treatment: Other (comment)(abdominal binder) Activity Tolerance: Patient tolerated  treatment well Patient left: in chair;with chair alarm set;with call bell/phone within reach Nurse Communication: Mobility status PT Visit Diagnosis: Muscle weakness (generalized) (M62.81);Difficulty in walking, not elsewhere classified (R26.2);Pain     Time: 5520-8022 PT Time Calculation (min) (ACUTE ONLY): 23 min  Charges:  $Gait Training: 8-22 mins $Therapeutic Activity: 8-22 mins                    G Codes:       Lorrin Goodell, PT  Office # [phone] Pager [phone]    Lorriane Shire 10/25/2017, 10:11 AM

## 2017-10-25 NOTE — Progress Notes (Signed)
5 Days Post-Op   Subjective/Chief Complaint: Bowels moving No nausea    Objective: Vital signs in last 24 hours: Temp:  [98.6 F (37 C)-99.3 F (37.4 C)] 98.8 F (37.1 C) (04/18 0745) Pulse Rate:  [76-90] 79 (04/18 0745) Resp:  [16-20] 17 (04/18 0745) BP: (108-163)/(66-82) 149/72 (04/18 0745) SpO2:  [97 %-100 %] 98 % (04/18 0745) Last BM Date: 10/24/17  Intake/Output from previous day: 04/17 0701 - 04/18 0700 In: 2576.7 [P.O.:270; I.V.:2306.7] Out: 825 [Urine:800; Drains:25] Intake/Output this shift: No intake/output data recorded.  Incision/Wound:CDI JP serous soft NT   Lab Results:  Recent Labs    10/23/17 0740  WBC 7.8  HGB 11.8*  HCT 34.6*  PLT 223   BMET Recent Labs    10/24/17 0919 10/25/17 0417  NA 138 137  K 3.2* 3.6  CL 101 103  CO2 30 29  GLUCOSE 123* 118*  BUN <5* <5*  CREATININE 0.49 0.61  CALCIUM 9.0 8.4*   PT/INR No results for input(s): LABPROT, INR in the last 72 hours. ABG No results for input(s): PHART, HCO3 in the last 72 hours.  Invalid input(s): PCO2, PO2  Studies/Results: No results found.  Anti-infectives: Anti-infectives (From admission, onward)   Start     Dose/Rate Route Frequency Ordered Stop   10/20/17 0800  vancomycin (VANCOCIN) IVPB 1000 mg/200 mL premix     1,000 mg 200 mL/hr over 60 Minutes Intravenous To ShortStay Surgical 10/20/17 0354 10/20/17 1243      Assessment/Plan: s/p Procedure(s): HERNIA REPAIR VENTRAL ADULT (N/A) SMALL BOWEL RESECTION SOFT DIET  SL IV  PT/OT  LOS: 5 days    Amanda Roman 10/25/2017

## 2017-10-25 NOTE — Progress Notes (Signed)
  Date: 10/25/2017  Patient name: Amanda Roman  Medical record number: [id number]  Date of birth: 09-Jun-1941   I have seen and evaluated this patient and I have discussed the plan of care with the house staff. Please see their note for complete details. I concur with their findings with the following additions/corrections:   Continues to improve after partial resection of ischemic bowel due to incarcerated hernia on 4/13, having BMs, advancing to soft diet today. Hypokalemia has improved with repletion of K and Mg, slowly restarting home BP meds, careful to avoid hypotension.  Lenice Pressman, M.D., Ph.D. 10/25/2017, 11:37 AM

## 2017-10-25 NOTE — Progress Notes (Signed)
   Subjective: Ms. Pitcher was seen sitting her bedside chair this morning. She continues to improve daily, and states she is feeling pretty good today. She state her pain continues to improve and she is only requiring her scheduled tylenol. She has had continued bowl movements and has tolerated her clear liquid diet well without any nausea.   Objective:  Vital signs in last 24 hours: Vitals:   10/24/17 1152 10/24/17 1354 10/24/17 2108 10/25/17 0532  BP: (!) 163/73 108/66 134/74 (!) 157/82  Pulse: 90 82 76 83  Resp: 16 20 20 18   Temp: 99.3 F (37.4 C) 98.6 F (37 C) 99.3 F (37.4 C) 99.3 F (37.4 C)  TempSrc: Oral Oral Oral Oral  SpO2: 100% 100% 97% 100%  Weight:      Height:       Physical Exam  Constitutional: She appears well-developed and well-nourished. No distress.  Cardiovascular: Normal rate, regular rhythm and intact distal pulses.  Systolic murmur  Pulmonary/Chest: Effort normal and breath sounds normal. No respiratory distress.  Abdominal: Soft.  Bowl Sounds present Moderately Tender to palpation Lower Quadrants Minimal serosanguinous drainage in JP Drain  Musculoskeletal: She exhibits edema. She exhibits no deformity.  Nonpitting Edema  Neurological: She is alert.  Skin: Skin is warm and dry.   Assessment/Plan: 77 yo F with a history of hypertension, GERD, osteoarthritis, and chronic venous insufficiency who presented with small bowel obstruction secondary to an incarcerated ventral hernia.  Partial small bowel obstruction (HCC) secondary to incarcerated ventral hernia: Improving > 4/13 Open surgical reduction of incarcerated bowel and partial resection ischemic loops, then hernia repair, drain placed. (No signs of perforation) > Now having flatus and bowl movements. > PT recommending SNF; /OT Recommend Home Health; 3 in 1 commode, [place] surgery following, appreciate their recommendations  - Discontinue central line and IVF - Acetaminophen  1,000mg  q8h - Out of bed, Up in chair Daily - Diet advanced to soft today  HTN: Hypertensive here off home meds. Blood pressure improving as home medications are reintroduced. 893Y-101B Systolic Last 51WCH. - Amlodipine 10mg  Daily, Clonidine 0.3mg  BID, Carvedilol 25mg  BID - Holding Lasix 40mg  Daily  Hypokalemia: Improved today, K 3.6. - AM BMP  FEN: Soft Diet VTE ppx: Lovenox Code Status: FULL  Dispo: Anticipated discharge pending clinical course.   Neva Seat, MD 10/25/2017, 6:55 AM Pager: Pager# [phone]

## 2017-10-25 NOTE — Plan of Care (Signed)
  Problem: Activity: Goal: Risk for activity intolerance will decrease Outcome: Progressing   Problem: Skin Integrity: Goal: Risk for impaired skin integrity will decrease Outcome: Progressing   

## 2017-10-26 DIAGNOSIS — M1711 Unilateral primary osteoarthritis, right knee: Secondary | ICD-10-CM | POA: Diagnosis not present

## 2017-10-26 DIAGNOSIS — F0390 Unspecified dementia without behavioral disturbance: Secondary | ICD-10-CM | POA: Diagnosis present

## 2017-10-26 DIAGNOSIS — I700 Atherosclerosis of aorta: Secondary | ICD-10-CM | POA: Diagnosis not present

## 2017-10-26 DIAGNOSIS — M1611 Unilateral primary osteoarthritis, right hip: Secondary | ICD-10-CM | POA: Diagnosis not present

## 2017-10-26 DIAGNOSIS — F419 Anxiety disorder, unspecified: Secondary | ICD-10-CM | POA: Diagnosis present

## 2017-10-26 DIAGNOSIS — M8580 Other specified disorders of bone density and structure, unspecified site: Secondary | ICD-10-CM | POA: Diagnosis not present

## 2017-10-26 DIAGNOSIS — Z86010 Personal history of colonic polyps: Secondary | ICD-10-CM | POA: Diagnosis not present

## 2017-10-26 DIAGNOSIS — I872 Venous insufficiency (chronic) (peripheral): Secondary | ICD-10-CM | POA: Diagnosis not present

## 2017-10-26 DIAGNOSIS — G8911 Acute pain due to trauma: Secondary | ICD-10-CM | POA: Diagnosis not present

## 2017-10-26 DIAGNOSIS — R7881 Bacteremia: Secondary | ICD-10-CM | POA: Diagnosis not present

## 2017-10-26 DIAGNOSIS — R262 Difficulty in walking, not elsewhere classified: Secondary | ICD-10-CM | POA: Diagnosis not present

## 2017-10-26 DIAGNOSIS — K649 Unspecified hemorrhoids: Secondary | ICD-10-CM | POA: Diagnosis not present

## 2017-10-26 DIAGNOSIS — R1310 Dysphagia, unspecified: Secondary | ICD-10-CM | POA: Diagnosis not present

## 2017-10-26 DIAGNOSIS — M6281 Muscle weakness (generalized): Secondary | ICD-10-CM | POA: Diagnosis not present

## 2017-10-26 DIAGNOSIS — I493 Ventricular premature depolarization: Secondary | ICD-10-CM | POA: Diagnosis not present

## 2017-10-26 DIAGNOSIS — S3092XA Unspecified superficial injury of abdominal wall, initial encounter: Secondary | ICD-10-CM | POA: Diagnosis not present

## 2017-10-26 DIAGNOSIS — M19071 Primary osteoarthritis, right ankle and foot: Secondary | ICD-10-CM | POA: Diagnosis not present

## 2017-10-26 DIAGNOSIS — K450 Other specified abdominal hernia with obstruction, without gangrene: Secondary | ICD-10-CM | POA: Diagnosis not present

## 2017-10-26 DIAGNOSIS — A419 Sepsis, unspecified organism: Secondary | ICD-10-CM | POA: Diagnosis not present

## 2017-10-26 DIAGNOSIS — L97929 Non-pressure chronic ulcer of unspecified part of left lower leg with unspecified severity: Secondary | ICD-10-CM | POA: Diagnosis not present

## 2017-10-26 DIAGNOSIS — L98499 Non-pressure chronic ulcer of skin of other sites with unspecified severity: Secondary | ICD-10-CM | POA: Diagnosis not present

## 2017-10-26 DIAGNOSIS — N390 Urinary tract infection, site not specified: Secondary | ICD-10-CM | POA: Diagnosis not present

## 2017-10-26 DIAGNOSIS — A4159 Other Gram-negative sepsis: Secondary | ICD-10-CM | POA: Diagnosis not present

## 2017-10-26 DIAGNOSIS — R609 Edema, unspecified: Secondary | ICD-10-CM | POA: Diagnosis not present

## 2017-10-26 DIAGNOSIS — G9341 Metabolic encephalopathy: Secondary | ICD-10-CM | POA: Diagnosis not present

## 2017-10-26 DIAGNOSIS — E876 Hypokalemia: Secondary | ICD-10-CM | POA: Diagnosis not present

## 2017-10-26 DIAGNOSIS — K436 Other and unspecified ventral hernia with obstruction, without gangrene: Secondary | ICD-10-CM | POA: Diagnosis not present

## 2017-10-26 DIAGNOSIS — Z9889 Other specified postprocedural states: Secondary | ICD-10-CM | POA: Diagnosis not present

## 2017-10-26 DIAGNOSIS — I10 Essential (primary) hypertension: Secondary | ICD-10-CM | POA: Diagnosis not present

## 2017-10-26 DIAGNOSIS — R6520 Severe sepsis without septic shock: Secondary | ICD-10-CM | POA: Diagnosis not present

## 2017-10-26 DIAGNOSIS — M19042 Primary osteoarthritis, left hand: Secondary | ICD-10-CM | POA: Diagnosis not present

## 2017-10-26 DIAGNOSIS — K219 Gastro-esophageal reflux disease without esophagitis: Secondary | ICD-10-CM | POA: Diagnosis not present

## 2017-10-26 DIAGNOSIS — N2889 Other specified disorders of kidney and ureter: Secondary | ICD-10-CM | POA: Diagnosis not present

## 2017-10-26 DIAGNOSIS — H269 Unspecified cataract: Secondary | ICD-10-CM | POA: Diagnosis not present

## 2017-10-26 DIAGNOSIS — Z87891 Personal history of nicotine dependence: Secondary | ICD-10-CM | POA: Diagnosis not present

## 2017-10-26 DIAGNOSIS — M1990 Unspecified osteoarthritis, unspecified site: Secondary | ICD-10-CM | POA: Diagnosis not present

## 2017-10-26 DIAGNOSIS — M19041 Primary osteoarthritis, right hand: Secondary | ICD-10-CM | POA: Diagnosis not present

## 2017-10-26 DIAGNOSIS — M19072 Primary osteoarthritis, left ankle and foot: Secondary | ICD-10-CM | POA: Diagnosis not present

## 2017-10-26 DIAGNOSIS — Z9049 Acquired absence of other specified parts of digestive tract: Secondary | ICD-10-CM | POA: Diagnosis not present

## 2017-10-26 DIAGNOSIS — R5383 Other fatigue: Secondary | ICD-10-CM | POA: Diagnosis not present

## 2017-10-26 DIAGNOSIS — R4020 Unspecified coma: Secondary | ICD-10-CM | POA: Diagnosis not present

## 2017-10-26 DIAGNOSIS — R410 Disorientation, unspecified: Secondary | ICD-10-CM | POA: Diagnosis not present

## 2017-10-26 DIAGNOSIS — T8149XA Infection following a procedure, other surgical site, initial encounter: Secondary | ICD-10-CM | POA: Diagnosis not present

## 2017-10-26 DIAGNOSIS — Z139 Encounter for screening, unspecified: Secondary | ICD-10-CM | POA: Diagnosis not present

## 2017-10-26 DIAGNOSIS — Z8719 Personal history of other diseases of the digestive system: Secondary | ICD-10-CM | POA: Diagnosis not present

## 2017-10-26 DIAGNOSIS — R402441 Other coma, without documented Glasgow coma scale score, or with partial score reported, in the field [EMT or ambulance]: Secondary | ICD-10-CM | POA: Diagnosis not present

## 2017-10-26 LAB — BASIC METABOLIC PANEL
Anion gap: 7 (ref 5–15)
BUN: 5 mg/dL — ABNORMAL LOW (ref 6–20)
CO2: 28 mmol/L (ref 22–32)
Calcium: 8.8 mg/dL — ABNORMAL LOW (ref 8.9–10.3)
Chloride: 104 mmol/L (ref 101–111)
Creatinine, Ser: 0.51 mg/dL (ref 0.44–1.00)
GFR calc Af Amer: >60 mL/min (ref >60)
GFR calc non Af Amer: >60 mL/min (ref >60)
Glucose, Bld: 105 mg/dL — ABNORMAL HIGH (ref 65–99)
Potassium: 3.5 mmol/L (ref 3.5–5.1)
Sodium: 139 mmol/L (ref 135–145)

## 2017-10-26 LAB — GLUCOSE, CAPILLARY: GLUCOSE-CAPILLARY: 93 mg/dL (ref 65–99)

## 2017-10-26 MED ORDER — POTASSIUM CHLORIDE CRYS ER 20 MEQ PO TBCR
40.0000 meq | EXTENDED_RELEASE_TABLET | Freq: Every day | ORAL | Status: DC
Start: 2017-10-26 — End: 2017-10-26
  Administered 2017-10-26: 40 meq via ORAL
  Filled 2017-10-26: qty 2

## 2017-10-26 MED ORDER — FUROSEMIDE 20 MG PO TABS
20.0000 mg | ORAL_TABLET | Freq: Every day | ORAL | Status: DC
  Administered 2017-10-26: 20 mg via ORAL
  Filled 2017-10-26: qty 1

## 2017-10-26 MED ORDER — FUROSEMIDE 20 MG PO TABS: 20.0000 mg | ORAL_TABLET | Freq: Every day | ORAL | 3 refills | Status: DC

## 2017-10-26 NOTE — Progress Notes (Signed)
Occupational Therapy Treatment Patient Details Name: Amanda Roman MRN: [id number] DOB: Jan 11, 1941 Today's Date: 10/26/2017    History of present illness Ms. Frison is a very pleasant 77 year old woman with a history of hypertension, GERD, osteoarthritis, and chronic venous insufficiency who is here for management of a small bowel obstruction secondary to an incarcerated ventral hernia.  S/p Primary repair of incarcerated incisional hernia with small bowel resection on 10/20/17.   OT comments  Pt presents sitting up in recliner, pleasant and willing to participate in therapy session. Pt completing room level functional mobility and standing grooming ADLs using RW with overall MinGuard assist; completing toileting with MinA and LB ADLs with overall ModA. Pt d/c plan now updated to SNF, feel this is appropriate to maximize pts safety and independence with ADLs and mobility prior to return home. Will continue to follow acutely to progress pt towards established OT goals.   Follow Up Recommendations  SNF;Supervision/Assistance - 24 hour    Equipment Recommendations  3 in 1 bedside commode;Other (comment)(TBD in next venue )          Precautions / Restrictions Precautions Precautions: Fall;Other (comment) Precaution Comments: jp drain, abdominal binder Restrictions Weight Bearing Restrictions: No       Mobility Bed Mobility               General bed mobility comments: Pt received in chair.  Transfers Overall transfer level: Needs assistance Equipment used: Rolling walker (2 wheeled) Transfers: Sit to/from Stand Sit to Stand: Min guard         General transfer comment: verbal cues for hand placement    Balance Overall balance assessment: Needs assistance Sitting-balance support: Feet supported;No upper extremity supported Sitting balance-Leahy Scale: Fair     Standing balance support: Bilateral upper extremity supported;No upper extremity supported;During functional  activity Standing balance-Leahy Scale: Fair Standing balance comment: pt able to maintain static standing without UE support during grooming ADLs with minguard; relies on RW during mobility                            ADL either performed or assessed with clinical judgement   ADL Overall ADL's : Needs assistance/impaired     Grooming: Wash/dry hands;Oral care;Min guard;Standing       Lower Body Bathing: Minimal assistance;Sit to/from stand Lower Body Bathing Details (indicate cue type and reason): pt able to reach lower portion of LEs, assist for thoroughness  Upper Body Dressing : Minimal assistance;Sitting Upper Body Dressing Details (indicate cue type and reason): doffing/donning new gown  Lower Body Dressing: Maximal assistance;Sit to/from stand Lower Body Dressing Details (indicate cue type and reason): assist to doff/don new socks  Toilet Transfer: Min guard;Ambulation;Regular Toilet;RW   Toileting- Clothing Manipulation and Hygiene: Minimal assistance;Sit to/from stand Toileting - Clothing Manipulation Details (indicate cue type and reason): pt completing peri-care, assist for thoroughness; steady assist in standing for gown management      Functional mobility during ADLs: Min guard;Rolling walker;Cueing for sequencing;Cueing for safety General ADL Comments: pt ambulating to bathroom; with mobility pt having urgency and slightly incontinent prior to reaching toilet; assisted with washing LB and pericare, donning new gown seated on toilet; pt then completing standing grooming ADLs prior to transfer to recliner                        Cognition Arousal/Alertness: Awake/alert Behavior During Therapy: WFL for tasks assessed/performed Overall Cognitive Status: Within  Functional Limits for tasks assessed                                                            Pertinent Vitals/ Pain       Pain Assessment: Faces Faces Pain Scale:  Hurts a little bit Pain Location: sx site Pain Descriptors / Indicators: Grimacing;Sore Pain Intervention(s): Monitored during session                                                          Frequency  Min 2X/week        Progress Toward Goals  OT Goals(current goals can now be found in the care plan section)  Progress towards OT goals: Progressing toward goals  Acute Rehab OT Goals Patient Stated Goal: rehab then home OT Goal Formulation: With patient/family Time For Goal Achievement: 11/05/17 Potential to Achieve Goals: Good  Plan Discharge plan needs to be updated                     AM-PAC PT "6 Clicks" Daily Activity     Outcome Measure   Help from another person eating meals?: A Little Help from another person taking care of personal grooming?: A Little Help from another person toileting, which includes using toliet, bedpan, or urinal?: A Lot Help from another person bathing (including washing, rinsing, drying)?: A Lot Help from another person to put on and taking off regular upper body clothing?: A Little Help from another person to put on and taking off regular lower body clothing?: A Lot 6 Click Score: 15    End of Session Equipment Utilized During Treatment: Rolling walker;Gait belt  OT Visit Diagnosis: Unsteadiness on feet (R26.81);Muscle weakness (generalized) (M62.81)   Activity Tolerance Patient tolerated treatment well   Patient Left in chair;with call bell/phone within reach;with chair alarm set   Nurse Communication Mobility status        Time: 4970-2637 OT Time Calculation (min): 29 min  Charges: OT General Charges $OT Visit: 1 Visit OT Treatments $Self Care/Home Management : 23-37 mins  Lou Cal, OT Pager [phone] 10/26/2017    Raymondo Band 10/26/2017, 1:39 PM

## 2017-10-26 NOTE — Progress Notes (Signed)
   Subjective: Ms. Oldfield was seen resting comfortably in her chair. She states she is feeling well today and has continued to have bowl movements. She states she has tolerated eating including chicken and vegetables. She denies any pain or nausea today.  Objective:  Vital signs in last 24 hours: Vitals:   10/25/17 0745 10/25/17 1407 10/25/17 2025 10/26/17 0626  BP: (!) 149/72 (!) 155/70 (!) 154/73 (!) 158/75  Pulse: 79 71 74 77  Resp: 17 18 19    Temp: 98.8 F (37.1 C) 98.6 F (37 C) 99.4 F (37.4 C) 99.2 F (37.3 C)  TempSrc: Oral Oral Oral Oral  SpO2: 98% 100% 99% 100%  Weight:      Height:       Physical Exam  Constitutional: She appears well-developed and well-nourished. No distress.  Cardiovascular: Normal rate, regular rhythm and intact distal pulses.  Systolic murmur  Pulmonary/Chest: Effort normal and breath sounds normal. No respiratory distress.  Abdominal: Soft. Bowel sounds are normal. She exhibits no distension.  Mildly tender to palpation of lower quadrants  Musculoskeletal: She exhibits edema. She exhibits no deformity.  Nonpitting Edema  Neurological: She is alert.  Skin: Skin is warm and dry.  Psychiatric: She has a normal mood and affect.   Assessment/Plan: 77 yo F with a history of hypertension, GERD, osteoarthritis, and chronic venous insufficiency who presented with small bowel obstruction secondary to an incarcerated ventral hernia.  Partial small bowel obstruction (HCC) secondary to incarcerated ventral hernia: Improving. > 4/13 Open surgical reduction of incarcerated bowel and partial resection ischemic loops, then hernia repair, drain placed. (No signs of perforation) > Continues to have BMs and flatus without nausea > PT recommending SNF; /OT Recommend Home Health; 3 in 1 commode, [place] surgery following, appreciate their recommendations  - Acetaminophen 1,000mg  q8h - Out of bed, Up in chair Daily  HTN: Hypertensive here off  home meds. Blood pressure improving as home medications are reintroduced. 500X Systolic Last 38HWE. - Amlodipine 10mg  Daily, Clonidine 0.3mg  BID, Carvedilol 25mg  BID - Restart home Lasix 20mg  Daily  Hypokalemia: K 3.5 today - Home Lasix restarted, will also restart home 75mEq K Daily  FEN: Soft Diet VTE ppx: Lovenox Code Status: FULL  Dispo: Anticipated discharge later today pending SNF placement and JP Drain Removal.   Neva Seat, MD 10/26/2017, 6:50 AM Pager: Pager# [phone]

## 2017-10-26 NOTE — Social Work (Signed)
Clinical Social Worker facilitated patient discharge including contacting patient family and facility to confirm patient discharge plans.  Clinical information faxed to facility and family agreeable with plan.  CSW arranged ambulance transport via [facility] to [facility].  RN to call [phone] with report  prior to discharge.  Clinical Social Worker will sign off for now as social work intervention is no longer needed. Please consult [place] again if new need arises.  Alexander Mt, [place] Social Worker

## 2017-10-26 NOTE — Social Work (Signed)
CSW met with pt and two sisters at bedside, pt sister [name] (who is also her POA) states that she is a deacon at a church near [place] and that is her first choice, pt in agreement. Pt sisters are going to look over packet with pt and pick a second choice in case bed availability is not there on day of discharge.   CSW continuing to follow, [facility] aware of pt preference, following for medical stability to start authorization.  Alexander Mt, [place] Work [phone]

## 2017-10-26 NOTE — Social Work (Signed)
CSW aware pt is likely discharge for today. Pt preferred facility [facility] is initiating insurance authorizaiton, able to take pt today.   CSW continuing to follow.  Alexander Mt, [place] Work [phone]

## 2017-10-26 NOTE — Progress Notes (Addendum)
Per Dr Kieth Brightly keep the JP drain and he is aware of Roman's discharge to SNF today.   Amanda Roman is aware of her discharge to [facility] today. Report was given to Bethann Berkshire at [place]. Discharged Roman via [place].

## 2017-10-26 NOTE — Clinical Social Work Placement (Signed)
   CLINICAL SOCIAL WORK PLACEMENT  NOTE [place] to call report to [phone]  Date:  10/26/2017  Patient Details  Name: Amanda Roman MRN: [id number] Date of Birth: 1941/06/07  Clinical Social Work is seeking post-discharge placement for this patient at the [place] level of care (*CSW will initial, date and re-position this form in  chart as items are completed):  Yes   Patient/family provided with [facility] Work Department's list of facilities offering this level of care within the geographic area requested by the patient (or if unable, by the patient's family).  Yes   Patient/family informed of their freedom to choose among providers that offer the needed level of care, that participate in Medicare, Medicaid or managed care program needed by the patient, have an available bed and are willing to accept the patient.  Yes   Patient/family informed of [facility]'s ownership interest in [facility] and [facility], as well as of the fact that they are under no obligation to receive care at these facilities.  PASRR submitted to EDS on       PASRR number received on       Existing PASRR number confirmed on 10/23/17     FL2 transmitted to all facilities in geographic area requested by pt/family on 10/23/17     FL2 transmitted to all facilities within larger geographic area on       Patient informed that his/her managed care company has contracts with or will negotiate with certain facilities, including the following:        Yes   Patient/family informed of bed offers received.  Patient chooses bed at [facility]     Physician recommends and patient chooses bed at      Patient to be transferred to [facility] on 10/26/17.  Patient to be transferred to facility by [facility]     Patient family notified on 10/26/17 of transfer.  Name of family member notified:  [name], sister/POA     PHYSICIAN        Additional Comment:    _______________________________________________ Alexander Mt, [place] 10/26/2017, 2:17 PM

## 2017-10-26 NOTE — Progress Notes (Addendum)
[facility] Progress Note  6 Days Post-Op  Subjective: CC:  Pain controlled. No new complaints.Tolerating PO. Had a BM yesterday. Mobilizing with walker. No N/V.  Objective: Vital signs in last 24 hours: Temp:  [98.6 F (37 C)-99.4 F (37.4 C)] 99.2 F (37.3 C) (04/19 0626) Pulse Rate:  [71-77] 77 (04/19 0626) Resp:  [18-19] 19 (04/18 2025) BP: (154-158)/(70-75) 158/75 (04/19 0626) SpO2:  [99 %-100 %] 100 % (04/19 0626) Last BM Date: 10/25/17  Intake/Output from previous day: 04/18 0701 - 04/19 0700 In: 240 [P.O.:240] Out: 10 [Drains:10] Intake/Output this shift: No intake/output data recorded.  PE: Gen:  Alert, NAD, pleasant Pulm:  Normal effort Abd: Soft, appropriately tender, incision c/d/i with staples in place, JP drain in place with minimal Sanguinous drainage. Skin: warm and dry, no rashes  Psych: A&Ox3   Lab Results:  No results for input(s): WBC, HGB, HCT, PLT in the last 72 hours. BMET Recent Labs    10/25/17 0417 10/26/17 0536  NA 137 139  K 3.6 3.5  CL 103 104  CO2 29 28  GLUCOSE 118* 105*  BUN <5* 5*  CREATININE 0.61 0.51  CALCIUM 8.4* 8.8*   PT/INR No results for input(s): LABPROT, INR in the last 72 hours. CMP     Component Value Date/Time   NA 139 10/26/2017 0536   NA 140 10/12/2017 1013   K 3.5 10/26/2017 0536   CL 104 10/26/2017 0536   CO2 28 10/26/2017 0536   GLUCOSE 105 (H) 10/26/2017 0536   BUN 5 (L) 10/26/2017 0536   BUN 10 10/12/2017 1013   CREATININE 0.51 10/26/2017 0536   CREATININE 0.68 11/05/2014 1017   CALCIUM 8.8 (L) 10/26/2017 0536   PROT 8.3 (H) 10/19/2017 2050   ALBUMIN 4.1 10/19/2017 2050   AST 27 10/19/2017 2050   ALT 23 10/19/2017 2050   ALKPHOS 73 10/19/2017 2050   BILITOT 0.8 10/19/2017 2050   GFRNONAA >60 10/26/2017 0536   GFRNONAA 86 11/05/2014 1017   GFRAA >60 10/26/2017 0536   GFRAA >89 11/05/2014 1017   Lipase     Component Value Date/Time   LIPASE 32 10/19/2017 2050        Studies/Results: No results found.  Anti-infectives: Anti-infectives (From admission, onward)   Start     Dose/Rate Route Frequency Ordered Stop   10/20/17 0800  vancomycin (VANCOCIN) IVPB 1000 mg/200 mL premix     1,000 mg 200 mL/hr over 60 Minutes Intravenous To [facility] 10/20/17 0354 10/20/17 1243     Assessment/Plan HTN - per primary service Hypokalemia - 3.1 yesterday, BMET pending today Delirium   Incarcerated ventral hernia with PSBO - s/p primary hernia repair with small bowel resection 10/20/17 Dr. Brantley Stage - POD#6 - post-op ileus resolved, having flatus and BMs - tolerating soft diet - mobilize, continue binder - PT/OT - drain output 10 cc/24h SS, can likely D/C drain today - will confirm with MD. -IS  FEN: clear liquids  VTE: SCDs, lovenox ID: no current abx  Dispo: having bowel function, pain controlled, tolerating diet. Stable for discharge to recommended facility. F/U Dr. Brantley Stage.   pts abdominal staples should be removed between 4/26-4/27. Our office is scheduling her for staple removal, but it is also appropriate for these to be removed at SNF if they are comfortable with this.   LOS: 6 days    Jill Alexanders , [facility] Surgery 10/26/2017, 10:24 AM Pager: [phone] Consults: [phone] Mon-Fri 7:00 am-4:30 pm Sat-Sun 7:00  am-11:30 am

## 2017-10-26 NOTE — Discharge Summary (Signed)
Name: Amanda Roman MRN: [id number] DOB: December 30, 1940 77 y.o. PCP: Oval Linsey, MD  Date of Admission: 10/19/2017  8:25 PM Date of Discharge: 10/26/2017 Attending Physician: Oda Kilts, MD  Discharge Diagnosis:  Principal Problem:   Incarcerated hernia of abdominal cavity Active Problems:   Partial small bowel obstruction [facility])   Discharge Medications: Allergies as of 10/26/2017      Reactions   Ace Inhibitors Other (See Comments)   angioedema   Penicillins Itching   Has patient had a PCN reaction causing immediate rash, facial/tongue/throat swelling, SOB or lightheadedness with hypotension: Yes Has patient had a PCN reaction causing severe rash involving mucus membranes or skin necrosis: No Has patient had a PCN reaction that required hospitalization No Has patient had a PCN reaction occurring within the last 10 years: No If all of the above answers are "NO", then may proceed with Cephalosporin use.   Acetaminophen-codeine Rash   Codeine Rash      Medication List    TAKE these medications   amLODipine 10 MG tablet Commonly known as:  NORVASC Take 1 tablet (10 mg total) by mouth daily.   ARTHRITIS PAIN RELIEF RUB EX Apply 1 application topically daily as needed (knee pain).   aspirin 81 MG chewable tablet Commonly known as:  ASPIRIN CHILDRENS Chew 1 tablet (81 mg total) by mouth daily.   calcium-vitamin D 500 MG tablet Take 1 tablet by mouth 2 (two) times daily.   capsaicin 0.025 % cream Commonly known as:  ZOSTRIX Apply topically 2 (two) times daily.   carvedilol 25 MG tablet Commonly known as:  COREG Take 1 tablet (25 mg total) by mouth 2 (two) times daily with a meal.   cloNIDine 0.3 MG tablet Commonly known as:  CATAPRES Take 1 tablet (0.3 mg total) by mouth 2 (two) times daily.   furosemide 20 MG tablet Commonly known as:  LASIX Take 1 tablet (20 mg total) by mouth daily. What changed:    medication strength  how much to take     multivitamin with minerals Tabs tablet Take 2 tablets by mouth daily. Centrum Silver   Potassium Chloride ER 20 MEQ Tbcr Take 40 mEq by mouth daily. What changed:    how much to take  when to take this   silver sulfADIAZINE 1 % cream Commonly known as:  SILVADENE Apply to affected area daily            Durable Medical Equipment  (From admission, onward)        Start     Ordered   10/23/17 1055  For home use only DME Walker rolling  Once    Question:  Patient needs a walker to treat with the following condition  Answer:  Incarcerated ventral hernia   10/23/17 1054   10/23/17 1055  For home use only DME 3 n 1  Once     10/23/17 1054      Disposition and follow-up:   Amanda Roman was discharged from [facility] in Stable condition.  At the hospital follow up visit please address:  Partial small bowel obstruction ([place]) secondary to incarcerated ventral hernia. - Ensure follow up with surgery - Ensure wound healing appropriately - Ask about progress ar Rehab Facility  Hypokalemia: - Resolved with repletion of Mg and K - Discharged on home oral K supplement - Patient reported taking 30m Lasix daily as opposed to 464m  - BMP to confirm appropriate K level  2.  Labs / imaging needed at time of follow-up: BMP  3.  Pending labs/ test needing follow-up: None  Follow-up Appointments:  Contact information for follow-up providers    Erroll Luna, MD. Go on 11/12/2017.   Specialty:  General Surgery Why:  Please arrive by 11:20 AM for follow up appointment. Bring photo ID and insurance information.  Contact information: [address] [place] [address] [phone]        [facility] [place]. Call on 10/26/2017.   Specialty:  General Surgery Why:  call to confirm appointment date/time for abdominal staple removal.  Contact information: [address] [place]  [place] [phone]           Contact information for after-discharge care    [facility] .   Service:  Skilled Nursing Contact information: [address] [place] [facility] Course by problem list:  Partial small bowel obstruction ([place]) secondary to incarcerated ventral hernia. Patient presented with acute abdominal pain with nausea, bilious vomiting and loose diarrhea. She was found to have an incarcerated ventral hernia leading to a partial small bowl obstruction. A segment of her bowl was noted to be necrotic during surgery and was resected (Pathology was benign, ischemic bowl). She experienced a post operative ileus which resolved with supportive care. Her bowl function returned and she was weaned off of narcotic pain medications. A JP Drain which placed intraoperatively, drainage slowed and drain is to be removed prior to discharge to a skilled nursing facility. Patient will follow up with surgery for check up and staple removal.  HTN: Patient experienced hypertension while off her home medications while NPO. Blood pressure was managed by with IV medication as needed. Once tolerating PO her home medications were reintroduced and her blood pressure improved.  Hypokalemia: Patient noted to be hypokalemic. Initially resistant to repletion. Magnesium was checked, which was borderline low. Following repletion of magnesium potassium repleted appropriately. Daily potassium supplementation restarted along with her home lasix on day of discharge.  Discharge Vitals:   BP (!) 158/75 (BP Location: Left Arm) Comment: RN notified  Pulse 77   Temp 99.2 F (37.3 C) (Oral)   Resp 19   Ht 5' (1.524 m)   Wt 165 lb 9.1 oz (75.1 kg)   SpO2 100%   BMI 32.33 kg/m   Pertinent Labs, Studies, and Procedures:  CBC Latest Ref Rng & Units 10/23/2017 10/22/2017 10/21/2017  WBC 4.0 - 10.5 K/uL 7.8 8.4 9.0   Hemoglobin 12.0 - 15.0 g/dL 11.8(L) 11.7(L) 13.2  Hematocrit 36.0 - 46.0 % 34.6(L) 34.6(L) 38.9  Platelets 150 - 400 K/uL 223 209 237   BMP Latest Ref Rng & Units 10/26/2017 10/25/2017 10/24/2017  Glucose 65 - 99 mg/dL 105(H) 118(H) 123(H)  BUN 6 - 20 mg/dL 5(L) <5(L) <5(L)  Creatinine 0.44 - 1.00 mg/dL 0.51 0.61 0.49  BUN/Creat Ratio 12 - 28 - - -  Sodium 135 - 145 mmol/L 139 137 138  Potassium 3.5 - 5.1 mmol/L 3.5 3.6 3.2(L)  Chloride 101 - 111 mmol/L 104 103 101  CO2 22 - 32 mmol/L _0 Calcium 8.9 - 10.3 mg/dL 8.8(L) 8.4(L) 9.0   Surgical Pathology: Diagnosis Small intestine, resection - BENIGN SMALL BOWEL WITH ISCHEMIC CHANGE. - NO DYSPLASIA OR MALIGNANCY.  Admission EKG  EKG Interpretation  Date/Time:  Friday October 19 2017 20:29:15 EDT Ventricular Rate:  66 PR Interval:    QRS Duration: 96 QT Interval:  442 QTC Calculation: 464 R Axis:   23 Text Interpretation:  Sinus rhythm Confirmed by Lajean Saver [phone]) on 10/20/2017 7:52:17 PM      CT Abdomen Pelvis: IMPRESSION: 1. Complex lower ventral abdominal wall hernia, slightly dumbbell-shaped. Prominent fluid-filled small bowel in the more caudal hernia sac with stranding of the adjacent fat, may reflect focal ileus, however no evidence of obstruction or bowel wall thickening. The more cranial hernia sac contains normal small and large bowel. 2. Urinary bladder distention to the level in bili callus, recommend correlation for urinary retention. 3. Gallstone versus Phrygian cap of the gallbladder, no pericholecystic inflammation. 4. Fluid in the vagina of uncertain etiology and significance. Uterine calcifications likely fibroid. 5. Small hiatal hernia. 6. Cardiomegaly.  Aortic Atherosclerosis  Abdominal Xray: IMPRESSION: The side port of a gastric tube is seen at the GE junction and further advancement of the tube is recommended by at least 3 to 5cm.  CXR (Portable): IMPRESSION: 1. RIGHT IJ central line appears  adequately positioned with tip at the level of the mid/lower SVC. No pneumothorax seen. 2. Enteric tube appears adequately positioned below the diaphragm. 3. Cardiomegaly.  Op Note Description of procedure: The patient was met in the holding area.  I answered all of her questions.  She was taken back to the operating room placed supine on the operating room table.  After induction of general anesthesia, a Foley catheter was placed under direct vision.  Timeout was done.  Proper patient and procedure were verified.  Lower midline incision was used to an old scar.  She had multiple hernia defects involving the lower abdominal wall with multiple projections of the omentum in these fascial defects.  The largest was about 3 cm.  This contains small bowel and omentum.  I carefully took the adhesions down.  I was able to reduce the hernia after opening the neck of the hernia sac reducing the small bowel but this was ischemic appearing did not appear viable.  I felt that resection would be in her best interest.  No evidence of perforation at this point time.  Using the LigaSure took down the mesentery.  The segment was about 10-12 cm appeared to be the distal jejunum.  We used a GIA 75 stapling devices to divide the proximal and distal bowel.  The segment was passed off the field.  A side-to-side functional end-to-end anastomosis was created using the GIA 75 stapler using blue cartridges and a TA 60 the common enterotomy.  The anastomosis was widely patent under no tension and there is no signs of any bleeding.  The mesenteric defect was closed with 2-0 Vicryl.  This was not kinked and a crotch stitch was placed at the anastomosis.  This was placed back in the abdomen.  The small bowel otherwise was run and found to be normal.  The descending colon was normal as well as sigmoid colon.  A ascending and transverse colons were grossly normal upon inspection.  There is no sign no signs of any pelvic mass.  The bladder is  well decompressed with a Foley.  I then began to mobilize the fat and skin off of the fascial anteriorly.  She had significant rectus diastases.  Given small bowel resection I did not opt to use mesh in this circumstance.  I was able to mobilize the midline  quite easily and she had a very floppy abdomen.  I felt that primary closure would be in her best interest given her advanced age and poor medical condition overall.  I mobilized the hernia sac off and passed any excess hernia sac off the field.  This exposed the fascia was pulled medially to the midline.  I then irrigated out the abdominal cavity and changed gloves.  I then repaired the abdominal wall primarily with #1 PDS and interrupted #1 Novafil.  This closed the defect nicely.  I then was able to imbricate the lower part of both rectus muscles back to the midline over the repair using #1 Novafil.  Hemostasis was achieved.  Through separate stab incision around [place] drain was placed.  This was secured to the skin with 2-0 nylon.  Exparel was injected as a block.  This was done up and down the rectus sheath.  I then closed the skin with staples.  Bulb placed to suction.  Binder placed.  All final counts found to be correct after application of honeycomb dressing.  The patient was then awoke extubated taken to recovery in satisfactory condition.  Discharge Instructions: Discharge Instructions    Call MD for:  difficulty breathing, headache or visual disturbances   Complete by:  As directed    Call MD for:  persistant dizziness or light-headedness   Complete by:  As directed    Call MD for:  persistant nausea and vomiting   Complete by:  As directed    Call MD for:  redness, tenderness, or signs of infection (pain, swelling, redness, odor or green/yellow discharge around incision site)   Complete by:  As directed    Call MD for:  severe uncontrolled pain   Complete by:  As directed    Discharge instructions   Complete by:  As directed    Thank  you for allowing [place] to care for you  Your symptoms were due to an obstruction in your bowls cause by a portion of the bowl becoming stuck in a hernia in your abdomen - You under went surgery to remove the bowl from this hernia and repair the hernia - The part of the bowl that was stuck was removed as the tissue had lost blood flow  Work hard a rehab to get stronger!  Please follow up with surgery for staple removal and post operative check up  Please follow up in [place] for follow up after your hospitalization - Schedule a follow up in the next 1-2 weeks   Increase activity slowly   Complete by:  As directed       Signed: Neva Seat, MD 10/26/2017, 12:48 PM   Pager: [phone]

## 2017-10-28 DIAGNOSIS — T8149XA Infection following a procedure, other surgical site, initial encounter: Secondary | ICD-10-CM | POA: Diagnosis not present

## 2017-10-28 DIAGNOSIS — I10 Essential (primary) hypertension: Secondary | ICD-10-CM | POA: Diagnosis not present

## 2017-10-31 DIAGNOSIS — I872 Venous insufficiency (chronic) (peripheral): Secondary | ICD-10-CM | POA: Diagnosis not present

## 2017-10-31 DIAGNOSIS — R609 Edema, unspecified: Secondary | ICD-10-CM | POA: Diagnosis not present

## 2017-10-31 DIAGNOSIS — L97929 Non-pressure chronic ulcer of unspecified part of left lower leg with unspecified severity: Secondary | ICD-10-CM | POA: Diagnosis not present

## 2017-11-01 DIAGNOSIS — L98499 Non-pressure chronic ulcer of skin of other sites with unspecified severity: Secondary | ICD-10-CM | POA: Diagnosis not present

## 2017-11-02 ENCOUNTER — Other Ambulatory Visit: Payer: Self-pay

## 2017-11-02 ENCOUNTER — Encounter (HOSPITAL_COMMUNITY): Payer: Self-pay

## 2017-11-02 ENCOUNTER — Emergency Department (HOSPITAL_COMMUNITY): Payer: Medicare Other

## 2017-11-02 ENCOUNTER — Inpatient Hospital Stay (HOSPITAL_COMMUNITY)
Admission: EM | Admit: 2017-11-02 | Discharge: 2017-11-07 | DRG: 871 | Disposition: A | Discharge status: Skilled Nursing Facility | Payer: Medicare Other | Source: Skilled Nursing Facility | Attending: Internal Medicine | Admitting: Internal Medicine

## 2017-11-02 DIAGNOSIS — M19042 Primary osteoarthritis, left hand: Secondary | ICD-10-CM | POA: Diagnosis not present

## 2017-11-02 DIAGNOSIS — T17928A Food in respiratory tract, part unspecified causing other injury, initial encounter: Secondary | ICD-10-CM

## 2017-11-02 DIAGNOSIS — G9341 Metabolic encephalopathy: Secondary | ICD-10-CM | POA: Diagnosis not present

## 2017-11-02 DIAGNOSIS — A4159 Other Gram-negative sepsis: Secondary | ICD-10-CM | POA: Diagnosis not present

## 2017-11-02 DIAGNOSIS — T17920A Food in respiratory tract, part unspecified causing asphyxiation, initial encounter: Secondary | ICD-10-CM

## 2017-11-02 DIAGNOSIS — I493 Ventricular premature depolarization: Secondary | ICD-10-CM | POA: Diagnosis not present

## 2017-11-02 DIAGNOSIS — R1310 Dysphagia, unspecified: Secondary | ICD-10-CM

## 2017-11-02 DIAGNOSIS — R6520 Severe sepsis without septic shock: Secondary | ICD-10-CM | POA: Diagnosis present

## 2017-11-02 DIAGNOSIS — M1611 Unilateral primary osteoarthritis, right hip: Secondary | ICD-10-CM | POA: Diagnosis present

## 2017-11-02 DIAGNOSIS — Z885 Allergy status to narcotic agent status: Secondary | ICD-10-CM

## 2017-11-02 DIAGNOSIS — Z9889 Other specified postprocedural states: Secondary | ICD-10-CM

## 2017-11-02 DIAGNOSIS — R5383 Other fatigue: Secondary | ICD-10-CM | POA: Diagnosis not present

## 2017-11-02 DIAGNOSIS — M1711 Unilateral primary osteoarthritis, right knee: Secondary | ICD-10-CM | POA: Diagnosis not present

## 2017-11-02 DIAGNOSIS — Z9049 Acquired absence of other specified parts of digestive tract: Secondary | ICD-10-CM | POA: Diagnosis not present

## 2017-11-02 DIAGNOSIS — Z833 Family history of diabetes mellitus: Secondary | ICD-10-CM

## 2017-11-02 DIAGNOSIS — I872 Venous insufficiency (chronic) (peripheral): Secondary | ICD-10-CM | POA: Diagnosis not present

## 2017-11-02 DIAGNOSIS — M19041 Primary osteoarthritis, right hand: Secondary | ICD-10-CM | POA: Diagnosis present

## 2017-11-02 DIAGNOSIS — Z86010 Personal history of colonic polyps: Secondary | ICD-10-CM | POA: Diagnosis not present

## 2017-11-02 DIAGNOSIS — F0390 Unspecified dementia without behavioral disturbance: Secondary | ICD-10-CM | POA: Diagnosis not present

## 2017-11-02 DIAGNOSIS — Z8249 Family history of ischemic heart disease and other diseases of the circulatory system: Secondary | ICD-10-CM

## 2017-11-02 DIAGNOSIS — A419 Sepsis, unspecified organism: Secondary | ICD-10-CM | POA: Diagnosis not present

## 2017-11-02 DIAGNOSIS — R4189 Other symptoms and signs involving cognitive functions and awareness: Secondary | ICD-10-CM

## 2017-11-02 DIAGNOSIS — Z87891 Personal history of nicotine dependence: Secondary | ICD-10-CM

## 2017-11-02 DIAGNOSIS — R410 Disorientation, unspecified: Secondary | ICD-10-CM

## 2017-11-02 DIAGNOSIS — Z139 Encounter for screening, unspecified: Secondary | ICD-10-CM | POA: Diagnosis not present

## 2017-11-02 DIAGNOSIS — Z8719 Personal history of other diseases of the digestive system: Secondary | ICD-10-CM | POA: Diagnosis not present

## 2017-11-02 DIAGNOSIS — N2889 Other specified disorders of kidney and ureter: Secondary | ICD-10-CM | POA: Diagnosis not present

## 2017-11-02 DIAGNOSIS — R402441 Other coma, without documented Glasgow coma scale score, or with partial score reported, in the field [EMT or ambulance]: Secondary | ICD-10-CM | POA: Diagnosis not present

## 2017-11-02 DIAGNOSIS — H269 Unspecified cataract: Secondary | ICD-10-CM | POA: Diagnosis present

## 2017-11-02 DIAGNOSIS — Z880 Allergy status to penicillin: Secondary | ICD-10-CM

## 2017-11-02 DIAGNOSIS — R4020 Unspecified coma: Secondary | ICD-10-CM | POA: Diagnosis not present

## 2017-11-02 DIAGNOSIS — I700 Atherosclerosis of aorta: Secondary | ICD-10-CM | POA: Diagnosis present

## 2017-11-02 DIAGNOSIS — I10 Essential (primary) hypertension: Secondary | ICD-10-CM | POA: Diagnosis present

## 2017-11-02 DIAGNOSIS — Z79899 Other long term (current) drug therapy: Secondary | ICD-10-CM

## 2017-11-02 DIAGNOSIS — F419 Anxiety disorder, unspecified: Secondary | ICD-10-CM | POA: Diagnosis present

## 2017-11-02 DIAGNOSIS — M19072 Primary osteoarthritis, left ankle and foot: Secondary | ICD-10-CM | POA: Diagnosis present

## 2017-11-02 DIAGNOSIS — N390 Urinary tract infection, site not specified: Secondary | ICD-10-CM | POA: Diagnosis present

## 2017-11-02 DIAGNOSIS — M19071 Primary osteoarthritis, right ankle and foot: Secondary | ICD-10-CM | POA: Diagnosis not present

## 2017-11-02 DIAGNOSIS — R7881 Bacteremia: Secondary | ICD-10-CM | POA: Diagnosis not present

## 2017-11-02 DIAGNOSIS — Z888 Allergy status to other drugs, medicaments and biological substances status: Secondary | ICD-10-CM

## 2017-11-02 DIAGNOSIS — M8580 Other specified disorders of bone density and structure, unspecified site: Secondary | ICD-10-CM | POA: Diagnosis present

## 2017-11-02 DIAGNOSIS — K450 Other specified abdominal hernia with obstruction, without gangrene: Secondary | ICD-10-CM | POA: Diagnosis not present

## 2017-11-02 LAB — CBC WITH DIFFERENTIAL/PLATELET
BASOS ABS: 0 10*3/uL (ref 0.0–0.1)
BASOS PCT: 0 %
EOS PCT: 2 %
Eosinophils Absolute: 0.1 10*3/uL (ref 0.0–0.7)
HCT: 35.8 % — ABNORMAL LOW (ref 36.0–46.0)
Hemoglobin: 12.3 g/dL (ref 12.0–15.0)
Lymphocytes Relative: 17 %
Lymphs Abs: 1.5 10*3/uL (ref 0.7–4.0)
MCH: 31.8 pg (ref 26.0–34.0)
MCHC: 34.4 g/dL (ref 30.0–36.0)
MCV: 92.5 fL (ref 78.0–100.0)
MONO ABS: 0.9 10*3/uL (ref 0.1–1.0)
Monocytes Relative: 10 %
Neutro Abs: 6.4 10*3/uL (ref 1.7–7.7)
Neutrophils Relative %: 71 %
PLATELETS: 449 10*3/uL — AB (ref 150–400)
RBC: 3.87 MIL/uL (ref 3.87–5.11)
RDW: 13.1 % (ref 11.5–15.5)
WBC: 8.9 10*3/uL (ref 4.0–10.5)

## 2017-11-02 LAB — COMPREHENSIVE METABOLIC PANEL
ALT: 48 U/L (ref 14–54)
AST: 35 U/L (ref 15–41)
Albumin: 3.1 g/dL — ABNORMAL LOW (ref 3.5–5.0)
Alkaline Phosphatase: 69 U/L (ref 38–126)
Anion gap: 11 (ref 5–15)
BILIRUBIN TOTAL: 0.7 mg/dL (ref 0.3–1.2)
BUN: 10 mg/dL (ref 6–20)
CO2: 28 mmol/L (ref 22–32)
CREATININE: 0.63 mg/dL (ref 0.44–1.00)
Calcium: 9.5 mg/dL (ref 8.9–10.3)
Chloride: 101 mmol/L (ref 101–111)
GFR calc Af Amer: >60 mL/min (ref >60)
Glucose, Bld: 122 mg/dL — ABNORMAL HIGH (ref 65–99)
Potassium: 3.6 mmol/L (ref 3.5–5.1)
Sodium: 140 mmol/L (ref 135–145)
TOTAL PROTEIN: 8.1 g/dL (ref 6.5–8.1)

## 2017-11-02 LAB — URINALYSIS, ROUTINE W REFLEX MICROSCOPIC
BILIRUBIN URINE: NEGATIVE
Glucose, UA: NEGATIVE mg/dL
Ketones, ur: NEGATIVE mg/dL
Nitrite: NEGATIVE
PH: 8 (ref 5.0–8.0)
Protein, ur: NEGATIVE mg/dL
SPECIFIC GRAVITY, URINE: 1.004 — AB (ref 1.005–1.030)
WBC, UA: >50 WBC/hpf — ABNORMAL HIGH (ref 0–5)

## 2017-11-02 LAB — MAGNESIUM: Magnesium: 1.8 mg/dL (ref 1.7–2.4)

## 2017-11-02 LAB — I-STAT TROPONIN, ED: TROPONIN I, POC: 0.01 ng/mL (ref 0.00–0.08)

## 2017-11-02 LAB — PROTIME-INR
INR: 1.02
Prothrombin Time: 13.3 seconds (ref 11.4–15.2)

## 2017-11-02 LAB — I-STAT CHEM 8, ED
BUN: 9 mg/dL (ref 6–20)
CREATININE: 0.7 mg/dL (ref 0.44–1.00)
Calcium, Ion: 1.22 mmol/L (ref 1.15–1.40)
Chloride: 101 mmol/L (ref 101–111)
Glucose, Bld: 124 mg/dL — ABNORMAL HIGH (ref 65–99)
HEMATOCRIT: 37 % (ref 36.0–46.0)
Hemoglobin: 12.6 g/dL (ref 12.0–15.0)
POTASSIUM: 3.6 mmol/L (ref 3.5–5.1)
Sodium: 142 mmol/L (ref 135–145)
TCO2: 30 mmol/L (ref 22–32)

## 2017-11-02 LAB — I-STAT CG4 LACTIC ACID, ED: LACTIC ACID, VENOUS: 0.91 mmol/L (ref 0.5–1.9)

## 2017-11-02 MED ORDER — SODIUM CHLORIDE 0.9 % IV SOLN: 250.0000 mL | INTRAVENOUS | Status: DC | PRN

## 2017-11-02 MED ORDER — HYDRALAZINE HCL 20 MG/ML IJ SOLN
10.0000 mg | Freq: Once | INTRAMUSCULAR | Status: AC
  Administered 2017-11-02: 10 mg via INTRAVENOUS
  Filled 2017-11-02: qty 1

## 2017-11-02 MED ORDER — CALCIUM CARBONATE-VITAMIN D 500-200 MG-UNIT PO TABS
1.0000 | ORAL_TABLET | Freq: Two times a day (BID) | ORAL | Status: DC
  Administered 2017-11-02 – 2017-11-07 (×9): 1 via ORAL
  Filled 2017-11-02 (×10): qty 1

## 2017-11-02 MED ORDER — CLONIDINE HCL 0.3 MG/24HR TD PTWK
0.3000 mg | MEDICATED_PATCH | TRANSDERMAL | Status: DC
  Administered 2017-11-02: 0.3 mg via TRANSDERMAL
  Filled 2017-11-02: qty 1

## 2017-11-02 MED ORDER — ACETAMINOPHEN 325 MG PO TABS
650.0000 mg | ORAL_TABLET | Freq: Four times a day (QID) | ORAL | Status: DC | PRN
  Administered 2017-11-03 – 2017-11-07 (×3): 650 mg via ORAL
  Filled 2017-11-02 (×3): qty 2

## 2017-11-02 MED ORDER — ACETAMINOPHEN 650 MG RE SUPP
650.0000 mg | Freq: Four times a day (QID) | RECTAL | Status: DC | PRN
Start: 2017-11-02 — End: 2017-11-07

## 2017-11-02 MED ORDER — MORPHINE SULFATE (PF) 2 MG/ML IV SOLN
0.5000 mg | Freq: Once | INTRAVENOUS | Status: AC
  Administered 2017-11-02: 0.5 mg via INTRAVENOUS

## 2017-11-02 MED ORDER — POTASSIUM CHLORIDE ER 20 MEQ PO TBCR
40.0000 meq | EXTENDED_RELEASE_TABLET | Freq: Every day | ORAL | Status: DC
  Filled 2017-11-02: qty 1

## 2017-11-02 MED ORDER — ADULT MULTIVITAMIN W/MINERALS CH
1.0000 | ORAL_TABLET | Freq: Every day | ORAL | Status: DC
  Administered 2017-11-03 – 2017-11-07 (×5): 1 via ORAL
  Filled 2017-11-02 (×5): qty 1

## 2017-11-02 MED ORDER — CALCIUM-VITAMIN D 500 MG PO TABS: 500.0000 mg | ORAL_TABLET | Freq: Two times a day (BID) | ORAL | Status: DC

## 2017-11-02 MED ORDER — SODIUM CHLORIDE 0.9 % IV SOLN
1.0000 g | Freq: Three times a day (TID) | INTRAVENOUS | Status: DC
  Filled 2017-11-02 (×2): qty 1

## 2017-11-02 MED ORDER — LABETALOL HCL 5 MG/ML IV SOLN
5.0000 mg | Freq: Once | INTRAVENOUS | Status: AC
  Administered 2017-11-02: 5 mg via INTRAVENOUS
  Filled 2017-11-02: qty 4

## 2017-11-02 MED ORDER — IOPAMIDOL (ISOVUE-300) INJECTION 61%
100.0000 mL | Freq: Once | INTRAVENOUS | Status: AC | PRN
  Administered 2017-11-02: 100 mL via INTRAVENOUS

## 2017-11-02 MED ORDER — SODIUM CHLORIDE 0.9 % IV BOLUS
1000.0000 mL | Freq: Once | INTRAVENOUS | Status: AC
  Administered 2017-11-02: 1000 mL via INTRAVENOUS

## 2017-11-02 MED ORDER — CEFTRIAXONE SODIUM 1 G IJ SOLR
1.0000 g | Freq: Once | INTRAMUSCULAR | Status: AC
  Administered 2017-11-02: 1 g via INTRAVENOUS
  Filled 2017-11-02: qty 10

## 2017-11-02 MED ORDER — IOPAMIDOL (ISOVUE-300) INJECTION 61%
INTRAVENOUS | Status: AC
  Filled 2017-11-02: qty 100

## 2017-11-02 MED ORDER — CLONIDINE HCL 0.2 MG PO TABS
0.3000 mg | ORAL_TABLET | Freq: Two times a day (BID) | ORAL | Status: DC
  Administered 2017-11-02 – 2017-11-05 (×6): 0.3 mg via ORAL
  Filled 2017-11-02 (×6): qty 1

## 2017-11-02 MED ORDER — HEPARIN SODIUM (PORCINE) 5000 UNIT/ML IJ SOLN
5000.0000 [IU] | Freq: Three times a day (TID) | INTRAMUSCULAR | Status: DC
  Administered 2017-11-02 – 2017-11-07 (×14): 5000 [IU] via SUBCUTANEOUS
  Filled 2017-11-02 (×14): qty 1

## 2017-11-02 MED ORDER — SODIUM CHLORIDE 0.9% FLUSH: 3.0000 mL | INTRAVENOUS | Status: DC | PRN

## 2017-11-02 MED ORDER — CLONIDINE HCL 0.1 MG PO TABS
0.3000 mg | ORAL_TABLET | Freq: Once | ORAL | Status: DC
  Filled 2017-11-02: qty 3

## 2017-11-02 MED ORDER — SODIUM CHLORIDE 0.9 % IV SOLN
1.0000 g | INTRAVENOUS | Status: DC
  Filled 2017-11-02: qty 10

## 2017-11-02 MED ORDER — CARVEDILOL 25 MG PO TABS
25.0000 mg | ORAL_TABLET | Freq: Two times a day (BID) | ORAL | Status: DC
Start: 2017-11-03 — End: 2017-11-07
  Administered 2017-11-03 – 2017-11-07 (×9): 25 mg via ORAL
  Filled 2017-11-02 (×10): qty 1

## 2017-11-02 MED ORDER — FUROSEMIDE 20 MG PO TABS
20.0000 mg | ORAL_TABLET | Freq: Every day | ORAL | Status: DC
  Administered 2017-11-03 – 2017-11-07 (×5): 20 mg via ORAL
  Filled 2017-11-02 (×5): qty 1

## 2017-11-02 MED ORDER — SODIUM CHLORIDE 0.9% FLUSH
3.0000 mL | Freq: Two times a day (BID) | INTRAVENOUS | Status: DC
  Administered 2017-11-02 – 2017-11-07 (×8): 3 mL via INTRAVENOUS

## 2017-11-02 NOTE — ED Notes (Signed)
Bed: WA20 Expected date:  Expected time:  Means of arrival:  Comments: 77 yo AMS

## 2017-11-02 NOTE — ED Notes (Signed)
Patient remains tachypnic. Dr. Wendee Beavers paged. Morphine 0.5mg  ordered.

## 2017-11-02 NOTE — ED Triage Notes (Signed)
Patient BIB [facility]- patient lives at [facility]. Patient was left at surgical clinic by nursing staff at [facility] for a scheduled appointment. Patient reported to have hernia repair surgery April 15th approximately- patient has JP drain to left abdomen with dark red output. Staff at [facility] states the patient was crying in pain in their waitingroom, but was not alert and oriented. Due to not getting a report from [facility] staff, who were not present, EMS services were called. Patient obeys commands in triage and responds to her name, but patient is not oriented to time and situation in triage. CBG for EMS= 133. EKG for EMS showed SR with occasional PVC's.

## 2017-11-02 NOTE — Progress Notes (Signed)
Pharmacy: ceftriaxone  Patient's a 77 y.o F s/p recent abd hernia repair with JP drain presented to the ED on 4/26 with AMS.  To start ceftriaxone for suspected UTI.  - 4/26 UA: large leuk, rare bact  Plan: - first ceftriaxone dose already ordered by MD provider - ceftriaxone 1gm IV q24h - No renal adjustment is needed with Ceftriaxone -- pharmacy will sign off - Re-consult [place] if need further assistance  Thank you for asking pharmacy to be part of this patient's care.  Dia Sitter, PharmD, BCPS 11/02/2017 2:58 PM

## 2017-11-02 NOTE — ED Notes (Signed)
Patient transported to CT 

## 2017-11-02 NOTE — ED Notes (Signed)
Attempted to call report. Was told by RN [place] that the MD stated the patient was to remain in the ED until her blood pressure and respirations normalized. Charge RN made aware.
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
## 2017-11-02 NOTE — Progress Notes (Signed)
Paged at [phone] two times over this patient. Discussed with nursing current situation. Patient is not taking oral intake due to somnolence. Will discontinue dysphagia 3 diet order and make npo. Speech therapy to evaluation.  Concern is for elevated HTN. I believe patient may be rebounding off the clonidine. Will place order for clonidine patch. Also have 1 time labetalol IV order administered. May observe in the ed until blood pressure control improved.   Chest x ray obtained which was negative. At this point I do not have any explanation for transient tachypnea.  Velvet Bathe MD

## 2017-11-03 ENCOUNTER — Other Ambulatory Visit: Payer: Self-pay

## 2017-11-03 DIAGNOSIS — K450 Other specified abdominal hernia with obstruction, without gangrene: Secondary | ICD-10-CM | POA: Diagnosis not present

## 2017-11-03 DIAGNOSIS — R410 Disorientation, unspecified: Secondary | ICD-10-CM

## 2017-11-03 DIAGNOSIS — Z9889 Other specified postprocedural states: Secondary | ICD-10-CM | POA: Diagnosis not present

## 2017-11-03 DIAGNOSIS — K649 Unspecified hemorrhoids: Secondary | ICD-10-CM | POA: Diagnosis not present

## 2017-11-03 DIAGNOSIS — I700 Atherosclerosis of aorta: Secondary | ICD-10-CM | POA: Diagnosis present

## 2017-11-03 DIAGNOSIS — G9341 Metabolic encephalopathy: Secondary | ICD-10-CM | POA: Diagnosis not present

## 2017-11-03 DIAGNOSIS — Z87891 Personal history of nicotine dependence: Secondary | ICD-10-CM | POA: Diagnosis not present

## 2017-11-03 DIAGNOSIS — R7881 Bacteremia: Secondary | ICD-10-CM | POA: Diagnosis not present

## 2017-11-03 DIAGNOSIS — M8580 Other specified disorders of bone density and structure, unspecified site: Secondary | ICD-10-CM | POA: Diagnosis present

## 2017-11-03 DIAGNOSIS — R1310 Dysphagia, unspecified: Secondary | ICD-10-CM | POA: Diagnosis not present

## 2017-11-03 DIAGNOSIS — L97929 Non-pressure chronic ulcer of unspecified part of left lower leg with unspecified severity: Secondary | ICD-10-CM | POA: Diagnosis not present

## 2017-11-03 DIAGNOSIS — Z8719 Personal history of other diseases of the digestive system: Secondary | ICD-10-CM | POA: Diagnosis not present

## 2017-11-03 DIAGNOSIS — K436 Other and unspecified ventral hernia with obstruction, without gangrene: Secondary | ICD-10-CM | POA: Diagnosis not present

## 2017-11-03 DIAGNOSIS — A4159 Other Gram-negative sepsis: Secondary | ICD-10-CM | POA: Diagnosis present

## 2017-11-03 DIAGNOSIS — I493 Ventricular premature depolarization: Secondary | ICD-10-CM | POA: Diagnosis present

## 2017-11-03 DIAGNOSIS — Z9049 Acquired absence of other specified parts of digestive tract: Secondary | ICD-10-CM | POA: Diagnosis not present

## 2017-11-03 DIAGNOSIS — M1611 Unilateral primary osteoarthritis, right hip: Secondary | ICD-10-CM | POA: Diagnosis present

## 2017-11-03 DIAGNOSIS — R609 Edema, unspecified: Secondary | ICD-10-CM | POA: Diagnosis not present

## 2017-11-03 DIAGNOSIS — M19072 Primary osteoarthritis, left ankle and foot: Secondary | ICD-10-CM | POA: Diagnosis present

## 2017-11-03 DIAGNOSIS — M19071 Primary osteoarthritis, right ankle and foot: Secondary | ICD-10-CM | POA: Diagnosis present

## 2017-11-03 DIAGNOSIS — R262 Difficulty in walking, not elsewhere classified: Secondary | ICD-10-CM | POA: Diagnosis not present

## 2017-11-03 DIAGNOSIS — E876 Hypokalemia: Secondary | ICD-10-CM | POA: Diagnosis not present

## 2017-11-03 DIAGNOSIS — M6281 Muscle weakness (generalized): Secondary | ICD-10-CM | POA: Diagnosis not present

## 2017-11-03 DIAGNOSIS — Z743 Need for continuous supervision: Secondary | ICD-10-CM | POA: Diagnosis not present

## 2017-11-03 DIAGNOSIS — T17920A Food in respiratory tract, part unspecified causing asphyxiation, initial encounter: Secondary | ICD-10-CM | POA: Diagnosis not present

## 2017-11-03 DIAGNOSIS — M19041 Primary osteoarthritis, right hand: Secondary | ICD-10-CM | POA: Diagnosis present

## 2017-11-03 DIAGNOSIS — A419 Sepsis, unspecified organism: Secondary | ICD-10-CM | POA: Diagnosis not present

## 2017-11-03 DIAGNOSIS — R6520 Severe sepsis without septic shock: Secondary | ICD-10-CM | POA: Diagnosis present

## 2017-11-03 DIAGNOSIS — N390 Urinary tract infection, site not specified: Secondary | ICD-10-CM

## 2017-11-03 DIAGNOSIS — F419 Anxiety disorder, unspecified: Secondary | ICD-10-CM | POA: Diagnosis present

## 2017-11-03 DIAGNOSIS — M1711 Unilateral primary osteoarthritis, right knee: Secondary | ICD-10-CM | POA: Diagnosis present

## 2017-11-03 DIAGNOSIS — Z86010 Personal history of colonic polyps: Secondary | ICD-10-CM | POA: Diagnosis not present

## 2017-11-03 DIAGNOSIS — F0390 Unspecified dementia without behavioral disturbance: Secondary | ICD-10-CM | POA: Diagnosis present

## 2017-11-03 DIAGNOSIS — R279 Unspecified lack of coordination: Secondary | ICD-10-CM | POA: Diagnosis not present

## 2017-11-03 DIAGNOSIS — I872 Venous insufficiency (chronic) (peripheral): Secondary | ICD-10-CM | POA: Diagnosis not present

## 2017-11-03 DIAGNOSIS — M19042 Primary osteoarthritis, left hand: Secondary | ICD-10-CM | POA: Diagnosis present

## 2017-11-03 DIAGNOSIS — I10 Essential (primary) hypertension: Secondary | ICD-10-CM | POA: Diagnosis not present

## 2017-11-03 DIAGNOSIS — H269 Unspecified cataract: Secondary | ICD-10-CM | POA: Diagnosis present

## 2017-11-03 LAB — BLOOD CULTURE ID PANEL (REFLEXED)
ACINETOBACTER BAUMANNII: NOT DETECTED
CANDIDA ALBICANS: NOT DETECTED
Candida glabrata: NOT DETECTED
Candida krusei: NOT DETECTED
Candida parapsilosis: NOT DETECTED
Candida tropicalis: NOT DETECTED
Carbapenem resistance: NOT DETECTED
ENTEROBACTER CLOACAE COMPLEX: NOT DETECTED
ENTEROBACTERIACEAE SPECIES: DETECTED — AB
ENTEROCOCCUS SPECIES: NOT DETECTED
Escherichia coli: NOT DETECTED
HAEMOPHILUS INFLUENZAE: NOT DETECTED
KLEBSIELLA PNEUMONIAE: NOT DETECTED
Klebsiella oxytoca: NOT DETECTED
LISTERIA MONOCYTOGENES: NOT DETECTED
NEISSERIA MENINGITIDIS: NOT DETECTED
PSEUDOMONAS AERUGINOSA: NOT DETECTED
Proteus species: NOT DETECTED
STREPTOCOCCUS AGALACTIAE: NOT DETECTED
STREPTOCOCCUS PYOGENES: NOT DETECTED
STREPTOCOCCUS SPECIES: NOT DETECTED
Serratia marcescens: NOT DETECTED
Staphylococcus aureus (BCID): NOT DETECTED
Staphylococcus species: NOT DETECTED
Streptococcus pneumoniae: NOT DETECTED

## 2017-11-03 LAB — BASIC METABOLIC PANEL
Anion gap: 12 (ref 5–15)
BUN: 10 mg/dL (ref 6–20)
CO2: 26 mmol/L (ref 22–32)
Calcium: 9.5 mg/dL (ref 8.9–10.3)
Chloride: 105 mmol/L (ref 101–111)
Creatinine, Ser: 0.58 mg/dL (ref 0.44–1.00)
GFR calc Af Amer: >60 mL/min (ref >60)
Glucose, Bld: 106 mg/dL — ABNORMAL HIGH (ref 65–99)
POTASSIUM: 3.5 mmol/L (ref 3.5–5.1)
SODIUM: 143 mmol/L (ref 135–145)

## 2017-11-03 LAB — CBC
HCT: 33.6 % — ABNORMAL LOW (ref 36.0–46.0)
Hemoglobin: 11.3 g/dL — ABNORMAL LOW (ref 12.0–15.0)
MCH: 31.3 pg (ref 26.0–34.0)
MCHC: 33.6 g/dL (ref 30.0–36.0)
MCV: 93.1 fL (ref 78.0–100.0)
PLATELETS: 414 10*3/uL — AB (ref 150–400)
RBC: 3.61 MIL/uL — ABNORMAL LOW (ref 3.87–5.11)
RDW: 13.2 % (ref 11.5–15.5)
WBC: 7.3 10*3/uL (ref 4.0–10.5)

## 2017-11-03 MED ORDER — SODIUM CHLORIDE 0.9 % IV SOLN
2.0000 g | INTRAVENOUS | Status: DC
  Administered 2017-11-03 – 2017-11-04 (×2): 2 g via INTRAVENOUS
  Filled 2017-11-03 (×2): qty 2

## 2017-11-03 MED ORDER — DIAZEPAM 2 MG PO TABS: 2.0000 mg | ORAL_TABLET | Freq: Two times a day (BID) | ORAL | Status: DC | PRN

## 2017-11-03 MED ORDER — POTASSIUM CHLORIDE CRYS ER 20 MEQ PO TBCR
40.0000 meq | EXTENDED_RELEASE_TABLET | Freq: Every day | ORAL | Status: DC
  Administered 2017-11-03 – 2017-11-07 (×5): 40 meq via ORAL
  Filled 2017-11-03 (×5): qty 2
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
## 2017-11-03 NOTE — Progress Notes (Signed)
Patient ID: Amanda Roman, female   DOB: October 12, 1940, 77 y.o.   MRN: [id number]  PROGRESS NOTE   SULTANA TIERNEY  WUJ:[id number] DOB: 10/13/1940 DOA: 11/02/2017 PCP: Oval Linsey, MD   Brief Narrative:   Amanda Roman is a 77 y.o. female with medical history significant of dementia, hypertension, recent ventral hernia repair admitted yesterday for UTI and positive blood culture for Enterobacteriaceae species. Currently on IV Ceftriaxone. No fever.  Assessment & Plan:   Active Problems:   Essential hypertension   Incarcerated hernia of abdominal cavity   Metabolic encephalopathy   Dementia  1. Sepsis secondary to UTI: Discussed with the pharmacist. Will continue IV Ceftriaxone but at an increased dose. Will follow up Urine Culture. Repeat blood Culture. Continue other management.   2. Essential Hypertension: Better controlled today. Continue home medication  3. Incarcerated Hernia S/P Repair: CT Abd/Pelvis showed: "There are postsurgical changes in the left lower abdomen from the recent hernia repair. Inflammatory type stranding is seen in the fat of the lower anterior abdominal wall and within the underlying anterior mesentery and omentum. However, there is no defined collection to suggest an abscess. Small residual fascial defect is seen in the left lower quadrant, just to the left of midline. Only a small amount of fat herniates through this. No bowel. Mild increased stool burden in the colon. No bowel obstruction or inflammation. Mild aortic atherosclerosis".  Will continue postop treatment regimen.  4. Dementia: Stable. Will consult case management/Social worker for discharge planning.  5. Anxiety/Agitation: Low dose Diazepam PRN.  DVT prophylaxis: Lovenox Code Status: Full Code Family Communication: Discussed with patient and family members Disposition Plan: Not yet ready for Discharge  Consultants:   General Surgery  Procedures:   None  Antimicrobials:   IV  Ceftriaxone  Subjective:  Patient feeling slightly better, she denies any fever, denies abdominal discomfort, denies any chest pain or SOB.   Objective: Vitals:   11/02/17 2200 11/02/17 2245 11/03/17 0500 11/03/17 1355  BP:  (!) 169/76 (!) 167/82 (!) 149/87  Pulse:  87 91 83  Resp:   20 18  Temp:   99 F (37.2 C) 98.6 F (37 C)  TempSrc:   Oral Oral  SpO2:   98% 100%  Weight: 70.7 kg (155 lb 13.8 oz)     Height: 5' (1.524 m)       Intake/Output Summary (Last 24 hours) at 11/03/2017 1539 Last data filed at 11/03/2017 0500 Gross per 24 hour  Intake 0 ml  Output 1100 ml  Net -1100 ml   Filed Weights   11/02/17 1135 11/02/17 2200  Weight: 74.8 kg (165 lb) 70.7 kg (155 lb 13.8 oz)   Examination:  General exam: Appears calm and comfortable, elderly  Respiratory system: Clear to auscultation. Respiratory effort normal. Cardiovascular system: S1 & S2 heard, RRR. No JVD, murmurs, rubs, gallops or clicks. No pedal edema. Gastrointestinal system: Midline surgical scar, infraumbilical. Abdomen is nondistended, soft and nontender. No organomegaly or masses felt. Normal bowel sounds heard. Central nervous system: Alert and oriented. No focal neurological deficits. Extremities: Symmetric 5 x 5 power. Skin: No rashes, lesions or ulcers Psychiatry: Judgement and insight appear normal. Mood & affect appropriate.   Data Reviewed: I have personally reviewed following labs and imaging studies  CBC: Recent Labs  Lab 11/02/17 1202 11/02/17 1206 11/03/17 0444  WBC 8.9  --  7.3  NEUTROABS 6.4  --   --   HGB 12.3 12.6 11.3*  HCT 35.8* 37.0  33.6*  MCV 92.5  --  93.1  PLT 449*  --  283*   Basic Metabolic Panel: Recent Labs  Lab 11/02/17 1202 11/02/17 1206 11/03/17 0444  NA 140 142 143  K 3.6 3.6 3.5  CL 101 101 105  CO2 28  --  26  GLUCOSE 122* 124* 106*  BUN 10 9 10   CREATININE 0.63 0.70 0.58  CALCIUM 9.5  --  9.5  MG 1.8  --   --    GFR: Estimated Creatinine Clearance:  50.9 mL/min (by C-G formula based on SCr of 0.58 mg/dL). Liver Function Tests: Recent Labs  Lab 11/02/17 1202  AST 35  ALT 48  ALKPHOS 69  BILITOT 0.7  PROT 8.1  ALBUMIN 3.1*   No results for input(s): LIPASE, AMYLASE in the last 168 hours. No results for input(s): AMMONIA in the last 168 hours. Coagulation Profile: Recent Labs  Lab 11/02/17 1202  INR 1.02   Cardiac Enzymes: No results for input(s): CKTOTAL, CKMB, CKMBINDEX, TROPONINI in the last 168 hours. BNP (last 3 results) No results for input(s): PROBNP in the last 8760 hours. HbA1C: No results for input(s): HGBA1C in the last 72 hours. CBG: No results for input(s): GLUCAP in the last 168 hours. Lipid Profile: No results for input(s): CHOL, HDL, LDLCALC, TRIG, CHOLHDL, LDLDIRECT in the last 72 hours. Thyroid Function Tests: No results for input(s): TSH, T4TOTAL, FREET4, T3FREE, THYROIDAB in the last 72 hours. Anemia Panel: No results for input(s): VITAMINB12, FOLATE, FERRITIN, TIBC, IRON, RETICCTPCT in the last 72 hours. Sepsis Labs: Recent Labs  Lab 11/02/17 1206  LATICACIDVEN 0.91    Recent Results (from the past 240 hour(s))  Culture, blood (Routine x 2)     Status: None (Preliminary result)   Collection Time: 11/02/17 11:42 AM  Result Value Ref Range Status   Specimen Description   Final    BLOOD LEFT ANTECUBITAL Performed at [facility] [address], [place], [place] [address]    Special Requests   Final    BOTTLES DRAWN AEROBIC AND ANAEROBIC Blood Culture results may not be optimal due to an inadequate volume of blood received in culture bottles Performed at [facility] [address] [place], [place] [address]    Culture   Final    NO GROWTH < 24 HOURS Performed at [facility] [address] [place], [place] [address]    Report Status PENDING  Incomplete  Culture, blood (Routine x 2)     Status: None (Preliminary result)   Collection Time:  11/02/17 12:03 PM  Result Value Ref Range Status   Specimen Description   Final    BLOOD RIGHT ANTECUBITAL Performed at [facility] [address] [place], [place] [address]    Special Requests   Final    BOTTLES DRAWN AEROBIC AND ANAEROBIC Blood Culture adequate volume Performed at [facility] [address] [place], [place] [address]    Culture  Setup Time   Final    GRAM NEGATIVE RODS AEROBIC BOTTLE ONLY CRITICAL RESULT CALLED TO, READ BACK BY AND VERIFIED WITH: D ZEIGLER,PHARMD AT 0017 11/03/17 BY L BENFIELD Performed at [facility], [place] [address], [place], [place] [address]    Culture GRAM NEGATIVE RODS  Final   Report Status PENDING  Incomplete  Blood Culture ID Panel (Reflexed)     Status: Abnormal   Collection Time: 11/02/17 12:03 PM  Result Value Ref Range  Status   Enterococcus species NOT DETECTED NOT DETECTED Final   Listeria monocytogenes NOT DETECTED NOT DETECTED Final   Staphylococcus species NOT DETECTED NOT DETECTED Final   Staphylococcus aureus NOT DETECTED NOT DETECTED Final   Streptococcus species NOT DETECTED NOT DETECTED Final   Streptococcus agalactiae NOT DETECTED NOT DETECTED Final   Streptococcus pneumoniae NOT DETECTED NOT DETECTED Final   Streptococcus pyogenes NOT DETECTED NOT DETECTED Final   Acinetobacter baumannii NOT DETECTED NOT DETECTED Final   Enterobacteriaceae species DETECTED (A) NOT DETECTED Final    Comment: Enterobacteriaceae represent a large family of gram negative bacteria, not a single organism. Refer to culture for further identification. CRITICAL RESULT CALLED TO, READ BACK BY AND VERIFIED WITH: D ZEIGLER,PHARMD AT 7902 11/03/17 BY L BENFIELD    Enterobacter cloacae complex NOT DETECTED NOT DETECTED Final   Escherichia coli NOT DETECTED NOT DETECTED Final   Klebsiella oxytoca NOT DETECTED NOT DETECTED Final   Klebsiella pneumoniae NOT DETECTED NOT DETECTED Final   Proteus species  NOT DETECTED NOT DETECTED Final   Serratia marcescens NOT DETECTED NOT DETECTED Final   Carbapenem resistance NOT DETECTED NOT DETECTED Final   Haemophilus influenzae NOT DETECTED NOT DETECTED Final   Neisseria meningitidis NOT DETECTED NOT DETECTED Final   Pseudomonas aeruginosa NOT DETECTED NOT DETECTED Final   Candida albicans NOT DETECTED NOT DETECTED Final   Candida glabrata NOT DETECTED NOT DETECTED Final   Candida krusei NOT DETECTED NOT DETECTED Final   Candida parapsilosis NOT DETECTED NOT DETECTED Final   Candida tropicalis NOT DETECTED NOT DETECTED Final    Comment: Performed at [facility], [place] [address] [place], [place] [address]     Radiology Studies: Dg Chest 2 View  Result Date: 11/02/2017 CLINICAL DATA:  Altered mental status today. EXAM: CHEST - 2 VIEW COMPARISON:  Single view of the chest 10/20/2017. FINDINGS: NG tube has been removed. There is cardiomegaly without edema. No consolidative process, pneumothorax or effusion. No acute bony abnormality. IMPRESSION: Cardiomegaly without acute disease. Electronically Signed   By: Inge Rise M.D.   On: 11/02/2017 12:37   Ct Head Wo Contrast  Result Date: 11/02/2017 CLINICAL DATA:  Altered level of consciousness. EXAM: CT HEAD WITHOUT CONTRAST TECHNIQUE: Contiguous axial images were obtained from the base of the skull through the vertex without intravenous contrast. COMPARISON:  None. FINDINGS: Brain: No evidence of acute infarction, hemorrhage, hydrocephalus, extra-axial collection or mass lesion/mass effect. Mild cerebral volume loss and periventricular chronic microvascular ischemic change. Vascular: No hyperdense vessel or unexpected calcification. Skull: Normal. Negative for fracture or focal lesion. Sinuses/Orbits: No acute finding. IMPRESSION: Senescent changes without acute finding. Electronically Signed   By: Monte Fantasia M.D.   On: 11/02/2017 13:56   Ct Abdomen Pelvis W Contrast  Result Date:  11/02/2017 CLINICAL DATA:  Patient BIB [facility]- patient lives at [facility]. Patient was left at surgical clinic by nursing staff at [facility] for a scheduled appointment. Patient reported to have hernia repair surgery April 15th approximately- patient has JP drain to left abdomen with dark red output. Staff at [facility] states the patient was crying in pain in their waitingroom, but was not alert and oriented. Due to not getting a report from [facility] staff, who were not present, EMS services were called. Patient obeys commands in triage and responds to her name, but patient is not oriented to time and situation in triage EXAM: CT ABDOMEN AND PELVIS  WITH CONTRAST TECHNIQUE: Multidetector CT imaging of the abdomen and pelvis was performed using the standard protocol following bolus administration of intravenous contrast. CONTRAST:  1104mL ISOVUE-300 IOPAMIDOL (ISOVUE-300) INJECTION 61% COMPARISON:  10/19/2017 FINDINGS: Lower chest: Lung base reticular opacities are noted bilaterally consistent with subsegmental atelectasis. Lower lobe bronchial wall thickening. No evidence of pneumonia or pulmonary edema. No pleural effusion. Heart is enlarged. Hepatobiliary: Normal liver. There is material of increased echogenicity in the gallbladder fundus associated with a small calcification. This may reflect an inherent mass. It could reflect focal adenomyosis. Subtle increased attenuation is noted in the dependent gallbladder which may reflect stones or sludge. There is no wall thickening or adjacent inflammation. No bile duct dilation. Pancreas: Unremarkable. No pancreatic ductal dilatation or surrounding inflammatory changes. Spleen: Normal in size without focal abnormality. Adrenals/Urinary Tract: No adrenal masses. 6 mm low-density mass in the upper pole the right kidney. 7 mm low-density mass in the posterior midpole of the left kidney. Both of  these are stable consistent with cysts. No other renal masses, no stones and no hydronephrosis. Ureters are normal in course and in caliber. Bladder is unremarkable. Stomach/Bowel: Stomach is unremarkable. Small bowel is normal caliber. No wall thickening. There is inflammatory type stranding in the anterior peritoneal fat and inferior omentum in the left mid to lower abdomen which abuts a loop of small bowel. No colonic wall thickening or inflammation. Rectum is mild-to-moderately distended with stool. There is mild increased stool burden in the right colon. Normal appendix is visualized. Vascular/Lymphatic: Aortic atherosclerosis. No enlarged abdominal or pelvic lymph nodes. Reproductive: 2 cm left anterior upper uterine segment subserosal fibroid. No adnexal/ovarian masses. Other: Surgical drain lies within the deep subcutaneous fat of the left lower quadrant. There is inflammatory type stranding surrounding the drain, but no defined collection to suggest an abscess. Small residual defect is seen in the fascia of the left lower quadrant through which a small amount of fat herniates. No bowel enters this. No ascites. Musculoskeletal: No fracture or acute finding. No osteoblastic or osteolytic lesions. IMPRESSION: 1. There are postsurgical changes in the left lower abdomen from the recent hernia repair. Inflammatory type stranding is seen in the fat of the lower anterior abdominal wall and within the underlying anterior mesentery and omentum. However, there is no defined collection to suggest an abscess. 2. Small residual fascial defect is seen in the left lower quadrant, just to the left of midline. Only a small amount of fat herniates through this. No bowel. 3. Mild increased stool burden in the colon. No bowel obstruction or inflammation. 4. Mild aortic atherosclerosis. Electronically Signed   By: Lajean Manes M.D.   On: 11/02/2017 14:10     Scheduled Meds: . calcium-vitamin D  1 tablet Oral BID  .  carvedilol  25 mg Oral BID WC  . cloNIDine  0.3 mg Transdermal Weekly  . cloNIDine  0.3 mg Oral BID  . furosemide  20 mg Oral Daily  . heparin  5,000 Units Subcutaneous Q8H  . multivitamin with minerals  1 tablet Oral Daily  . potassium chloride  40 mEq Oral Daily  . sodium chloride flush  3 mL Intravenous Q12H   Continuous Infusions: . sodium chloride    . cefTRIAXone (ROCEPHIN)  IV Stopped (11/03/17 1037)     LOS: 0 days   Time spent: 50 minutes  Angelica Chessman, MD, FACP, CPE  [facility] If 7PM-7AM, please contact night-coverage [email or web address] Password [facility] 11/03/2017, 3:39 PM

## 2017-11-03 NOTE — Progress Notes (Signed)
PHARMACY - PHYSICIAN COMMUNICATION CRITICAL VALUE ALERT - BLOOD CULTURE IDENTIFICATION (BCID)  Amanda Roman is an 77 y.o. female who presented to [facility] on 11/02/2017 with a chief complaint of AMS changes  Assessment:  GNR current in 1/2 sets, BCID detects enterobacteriaceae but not exact organism  Name of physician (or Provider) Contacted: Dr. Doreene Burke  Current antibiotics: ceftriaxone 1gm q24h  Changes to prescribed antibiotics recommended:  Patient is on recommended antibiotics - No changes needed  - increase ceftriaxone to 2gm q24h - f/u final Identification and susc  Results for orders placed or performed during the hospital encounter of 11/02/17  Blood Culture ID Panel (Reflexed) (Collected: 11/02/2017 12:03 PM)  Result Value Ref Range   Enterococcus species NOT DETECTED NOT DETECTED   Listeria monocytogenes NOT DETECTED NOT DETECTED   Staphylococcus species NOT DETECTED NOT DETECTED   Staphylococcus aureus NOT DETECTED NOT DETECTED   Streptococcus species NOT DETECTED NOT DETECTED   Streptococcus agalactiae NOT DETECTED NOT DETECTED   Streptococcus pneumoniae NOT DETECTED NOT DETECTED   Streptococcus pyogenes NOT DETECTED NOT DETECTED   Acinetobacter baumannii NOT DETECTED NOT DETECTED   Enterobacteriaceae species DETECTED (A) NOT DETECTED   Enterobacter cloacae complex NOT DETECTED NOT DETECTED   Escherichia coli NOT DETECTED NOT DETECTED   Klebsiella oxytoca NOT DETECTED NOT DETECTED   Klebsiella pneumoniae NOT DETECTED NOT DETECTED   Proteus species NOT DETECTED NOT DETECTED   Serratia marcescens NOT DETECTED NOT DETECTED   Carbapenem resistance NOT DETECTED NOT DETECTED   Haemophilus influenzae NOT DETECTED NOT DETECTED   Neisseria meningitidis NOT DETECTED NOT DETECTED   Pseudomonas aeruginosa NOT DETECTED NOT DETECTED   Candida albicans NOT DETECTED NOT DETECTED   Candida glabrata NOT DETECTED NOT DETECTED   Candida krusei NOT DETECTED NOT DETECTED   Candida  parapsilosis NOT DETECTED NOT DETECTED   Candida tropicalis NOT DETECTED NOT DETECTED    Doreene Eland, PharmD, BCPS.   Pager: [phone] 11/03/2017 8:26 AM

## 2017-11-04 ENCOUNTER — Encounter (HOSPITAL_COMMUNITY): Payer: Self-pay

## 2017-11-04 LAB — BLOOD GAS, ARTERIAL
Acid-Base Excess: 5.3 mmol/L — ABNORMAL HIGH (ref 0.0–2.0)
Bicarbonate: 30.1 mmol/L — ABNORMAL HIGH (ref 20.0–28.0)
O2 CONTENT: 1 L/min
O2 SAT: 96.3 %
PATIENT TEMPERATURE: 100.4
PCO2 ART: 49.8 mmHg — AB (ref 32.0–48.0)
pH, Arterial: 7.404 (ref 7.350–7.450)
pO2, Arterial: 91.7 mmHg (ref 83.0–108.0)

## 2017-11-04 LAB — COMPREHENSIVE METABOLIC PANEL
ALBUMIN: 2.7 g/dL — AB (ref 3.5–5.0)
ALT: 39 U/L (ref 14–54)
ANION GAP: 10 (ref 5–15)
AST: 29 U/L (ref 15–41)
Alkaline Phosphatase: 58 U/L (ref 38–126)
BILIRUBIN TOTAL: 0.6 mg/dL (ref 0.3–1.2)
BUN: 11 mg/dL (ref 6–20)
CO2: 27 mmol/L (ref 22–32)
Calcium: 9.9 mg/dL (ref 8.9–10.3)
Chloride: 105 mmol/L (ref 101–111)
Creatinine, Ser: 0.56 mg/dL (ref 0.44–1.00)
Glucose, Bld: 112 mg/dL — ABNORMAL HIGH (ref 65–99)
Potassium: 4 mmol/L (ref 3.5–5.1)
Sodium: 142 mmol/L (ref 135–145)
TOTAL PROTEIN: 7.1 g/dL (ref 6.5–8.1)

## 2017-11-04 LAB — LACTIC ACID, PLASMA: Lactic Acid, Venous: 0.6 mmol/L (ref 0.5–1.9)

## 2017-11-04 LAB — GLUCOSE, CAPILLARY: GLUCOSE-CAPILLARY: 107 mg/dL — AB (ref 65–99)

## 2017-11-04 MED ORDER — SODIUM CHLORIDE 0.9 % IV SOLN
INTRAVENOUS | Status: DC
  Administered 2017-11-04: 05:00:00 via INTRAVENOUS

## 2017-11-04 MED ORDER — SODIUM CHLORIDE 0.9 % IV SOLN
2.0000 g | Freq: Three times a day (TID) | INTRAVENOUS | Status: DC
  Administered 2017-11-04 – 2017-11-05 (×3): 2 g via INTRAVENOUS
  Filled 2017-11-04 (×4): qty 2

## 2017-11-04 MED ORDER — VANCOMYCIN HCL IN DEXTROSE 1-5 GM/200ML-% IV SOLN: 1000.0000 mg | INTRAVENOUS | Status: DC

## 2017-11-04 MED ORDER — PIPERACILLIN-TAZOBACTAM 3.375 G IVPB 30 MIN: 3.3750 g | Freq: Four times a day (QID) | INTRAVENOUS | Status: DC

## 2017-11-04 MED ORDER — VANCOMYCIN HCL 10 G IV SOLR
1500.0000 mg | Freq: Once | INTRAVENOUS | Status: AC
  Administered 2017-11-04: 1500 mg via INTRAVENOUS
  Filled 2017-11-04: qty 1500

## 2017-11-04 NOTE — Progress Notes (Signed)
Patient continues to be lethargic for most of the day. VS are all stable. Patient woke up around 10am and ate her breakfast and spoke a few words but after that she fell right back asleep and has been asleep ever sense. MD notified. New orders placed. Foley placed. Waiting for MRI to be done.  Will continue to monitor patient closely.

## 2017-11-04 NOTE — Progress Notes (Signed)
All of tonight, patient has been very lethargic. At the beginning of the shift she was able to tell me her name and her complaints. She also was able to take her pills whole but she was very hard to keep awake. Then this morning, she was not responding to [place] giving her a shot. She would barely touch my hand when asking her to grab it. On call was notified and new orders were given for fluids. Afterwards, nurse called on call again because she was concerned with patient still not responding. New orders given for a lactic acid. Lactic acid came back normal and so RN notified on call of the lab and asked if someone could come up and assess patient. Day RN made aware of all the events that happened through the night.

## 2017-11-04 NOTE — Progress Notes (Signed)
Patient ID: Amanda Roman, female   DOB: October 12, 1940, 77 y.o.   MRN: [id number]  PROGRESS NOTE   Amanda Roman  QPR:[id number] DOB: [birth year] DOA: 11/02/2017 PCP: Oval Linsey, MD   Brief Narrative:  Patient was noticed to be "lethargic" all night, and this morning, very sleepy and barely responding to call. All vitals are within normal. Low grade fever noticed but once. Patient woke up during this encounter and was able to answer questions but very drowsy unlike yesterday. She could not answer if she feels any discomfort. Patient did not receive any sedatives.   Assessment & Plan:   Active Problems:   Essential hypertension   Incarcerated hernia of abdominal cavity   Metabolic encephalopathy   Dementia   Confusion   Lower urinary tract infectious disease  1. Altered Mental Status: Patient's mental status has changed, more lethargic today than yesterday, all labs and imaging studies reviewed by me, mostly unremarkable findings. Will order ABG, Lactic Acid, CBCD, Re Blood and Urine Cx, continue IVF and antibiotics. CXR in the morning. Ordered MRI Brain and Neurology Consult.  2. Sepsis secondary to UTI: Will continue IV Ceftriaxone at current dose. Will follow up Urine and blood Culture. Continue other management.   3. Essential Hypertension: Better controlled today. Continue home medication  4. Incarcerated Hernia S/P Repair: Will continue postop treatment regimen.  5. Dementia: Stable. Case management/Social worker on consult for discharge planning.  6. Anxiety/Agitation: Patient is Lethargic today, No sedatives or anxiolytics.  DVT prophylaxis: Lovenox Code Status: Full Code Family Communication: Discussed with patient and family members Disposition Plan: Not yet ready for Discharge  Consultants:   Neurology, General Surgery  Procedures:   Insert Foley's Catheter  Antimicrobials:   Ceftriaxone   Subjective: Objective: Vitals:   11/04/17 0418 11/04/17 0631  11/04/17 0737 11/04/17 1236  BP: (!) 171/74 (!) 178/79 (!) 175/74 (!) 145/73  Pulse: 68 72 72 70  Resp: 19 (!) 21 (!) 26 (!) 26  Temp: 98.8 F (37.1 C) 97.9 F (36.6 C) 99.6 F (37.6 C)   TempSrc:   Rectal   SpO2: 100% 100% 100% 100%  Weight:      Height:        Intake/Output Summary (Last 24 hours) at 11/04/2017 1313 Last data filed at 11/04/2017 1000 Gross per 24 hour  Intake 850 ml  Output 250 ml  Net 600 ml   Filed Weights   11/02/17 1135 11/02/17 2200  Weight: 74.8 kg (165 lb) 70.7 kg (155 lb 13.8 oz)   Examination: General exam: Drowsy/Lethargic, arousable, afebrile to touch, anicteric, breathing comfortably Respiratory system: Clear to auscultation. Respiratory effort normal. Cardiovascular system: S1 & S2 heard, RRR. No JVD, murmurs, rubs, gallops or clicks. No pedal edema. Gastrointestinal system: Abdomen is nondistended, soft and nontender. No organomegaly or masses felt. Normal bowel sounds heard. Central nervous system: Alert and oriented. No focal neurological deficits. Extremities: Symmetric 5 x 5 power. Skin: No rashes, lesions or ulcers Psychiatry: Judgement and insight appear normal. Mood & affect appropriate.   Data Reviewed: I have personally reviewed following labs and imaging studies  CBC: Recent Labs  Lab 11/02/17 1202 11/02/17 1206 11/03/17 0444  WBC 8.9  --  7.3  NEUTROABS 6.4  --   --   HGB 12.3 12.6 11.3*  HCT 35.8* 37.0 33.6*  MCV 92.5  --  93.1  PLT 449*  --  993*   Basic Metabolic Panel: Recent Labs  Lab 11/02/17 1202 11/02/17 1206 11/03/17 0444  11/04/17 0510  NA 140 142 143 142  K 3.6 3.6 3.5 4.0  CL 101 101 105 105  CO2 28  --  26 27  GLUCOSE 122* 124* 106* 112*  BUN 10 9 10 11   CREATININE 0.63 0.70 0.58 0.56  CALCIUM 9.5  --  9.5 9.9  MG 1.8  --   --   --    GFR: Estimated Creatinine Clearance: 50.9 mL/min (by C-G formula based on SCr of 0.56 mg/dL). Liver Function Tests: Recent Labs  Lab 11/02/17 1202  11/04/17 0510  AST 35 29  ALT 48 39  ALKPHOS 69 58  BILITOT 0.7 0.6  PROT 8.1 7.1  ALBUMIN 3.1* 2.7*   No results for input(s): LIPASE, AMYLASE in the last 168 hours. No results for input(s): AMMONIA in the last 168 hours. Coagulation Profile: Recent Labs  Lab 11/02/17 1202  INR 1.02   Cardiac Enzymes: No results for input(s): CKTOTAL, CKMB, CKMBINDEX, TROPONINI in the last 168 hours. BNP (last 3 results) No results for input(s): PROBNP in the last 8760 hours. HbA1C: No results for input(s): HGBA1C in the last 72 hours. CBG: Recent Labs  Lab 11/04/17 0627  GLUCAP 107*   Lipid Profile: No results for input(s): CHOL, HDL, LDLCALC, TRIG, CHOLHDL, LDLDIRECT in the last 72 hours. Thyroid Function Tests: No results for input(s): TSH, T4TOTAL, FREET4, T3FREE, THYROIDAB in the last 72 hours. Anemia Panel: No results for input(s): VITAMINB12, FOLATE, FERRITIN, TIBC, IRON, RETICCTPCT in the last 72 hours. Sepsis Labs: Recent Labs  Lab 11/02/17 1206 11/04/17 0550  LATICACIDVEN 0.91 0.6    Recent Results (from the past 240 hour(s))  Culture, blood (Routine x 2)     Status: None (Preliminary result)   Collection Time: 11/02/17 11:42 AM  Result Value Ref Range Status   Specimen Description   Final    BLOOD LEFT ANTECUBITAL Performed at [facility] [address], [place], [place] [address]    Special Requests   Final    BOTTLES DRAWN AEROBIC AND ANAEROBIC Blood Culture results may not be optimal due to an inadequate volume of blood received in culture bottles Performed at [facility] [address] [place], [place] [address]    Culture   Final    NO GROWTH 2 DAYS Performed at [facility] [address] [place], [place] [address]    Report Status PENDING  Incomplete  Culture, blood (Routine x 2)     Status: Abnormal (Preliminary result)   Collection Time: 11/02/17 12:03 PM  Result Value Ref Range Status   Specimen  Description   Final    BLOOD RIGHT ANTECUBITAL Performed at [facility] [address] [place], [place] [address]    Special Requests   Final    BOTTLES DRAWN AEROBIC AND ANAEROBIC Blood Culture adequate volume Performed at [facility] [address], [place], [place] [address]    Culture  Setup Time   Final    GRAM NEGATIVE RODS AEROBIC BOTTLE ONLY CRITICAL RESULT CALLED TO, READ BACK BY AND VERIFIED WITH: D ZEIGLER,PHARMD AT 0160 11/03/17 BY L BENFIELD Performed at [facility], [place] [address], [place], [place] [address]    Culture CITROBACTER KOSERI (A)  Final   Report Status PENDING  Incomplete  Blood Culture ID Panel (Reflexed)     Status: Abnormal   Collection Time: 11/02/17 12:03 PM  Result Value Ref Range Status   Enterococcus species NOT  DETECTED NOT DETECTED Final   Listeria monocytogenes NOT DETECTED NOT DETECTED Final   Staphylococcus species NOT DETECTED NOT DETECTED Final   Staphylococcus aureus NOT DETECTED NOT DETECTED Final   Streptococcus species NOT DETECTED NOT DETECTED Final   Streptococcus agalactiae NOT DETECTED NOT DETECTED Final   Streptococcus pneumoniae NOT DETECTED NOT DETECTED Final   Streptococcus pyogenes NOT DETECTED NOT DETECTED Final   Acinetobacter baumannii NOT DETECTED NOT DETECTED Final   Enterobacteriaceae species DETECTED (A) NOT DETECTED Final    Comment: Enterobacteriaceae represent a large family of gram negative bacteria, not a single organism. Refer to culture for further identification. CRITICAL RESULT CALLED TO, READ BACK BY AND VERIFIED WITH: D ZEIGLER,PHARMD AT 7673 11/03/17 BY L BENFIELD    Enterobacter cloacae complex NOT DETECTED NOT DETECTED Final   Escherichia coli NOT DETECTED NOT DETECTED Final   Klebsiella oxytoca NOT DETECTED NOT DETECTED Final   Klebsiella pneumoniae NOT DETECTED NOT DETECTED Final   Proteus species NOT DETECTED NOT DETECTED Final   Serratia marcescens NOT  DETECTED NOT DETECTED Final   Carbapenem resistance NOT DETECTED NOT DETECTED Final   Haemophilus influenzae NOT DETECTED NOT DETECTED Final   Neisseria meningitidis NOT DETECTED NOT DETECTED Final   Pseudomonas aeruginosa NOT DETECTED NOT DETECTED Final   Candida albicans NOT DETECTED NOT DETECTED Final   Candida glabrata NOT DETECTED NOT DETECTED Final   Candida krusei NOT DETECTED NOT DETECTED Final   Candida parapsilosis NOT DETECTED NOT DETECTED Final   Candida tropicalis NOT DETECTED NOT DETECTED Final    Comment: Performed at [facility], [place] [address], [place], [place] [address]     Radiology Studies: Ct Head Wo Contrast  Result Date: 11/02/2017 CLINICAL DATA:  Altered level of consciousness. EXAM: CT HEAD WITHOUT CONTRAST TECHNIQUE: Contiguous axial images were obtained from the base of the skull through the vertex without intravenous contrast. COMPARISON:  None. FINDINGS: Brain: No evidence of acute infarction, hemorrhage, hydrocephalus, extra-axial collection or mass lesion/mass effect. Mild cerebral volume loss and periventricular chronic microvascular ischemic change. Vascular: No hyperdense vessel or unexpected calcification. Skull: Normal. Negative for fracture or focal lesion. Sinuses/Orbits: No acute finding. IMPRESSION: Senescent changes without acute finding. Electronically Signed   By: Monte Fantasia M.D.   On: 11/02/2017 13:56   Ct Abdomen Pelvis W Contrast  Result Date: 11/02/2017 CLINICAL DATA:  Patient BIB [facility]- patient lives at [facility]. Patient was left at surgical clinic by nursing staff at [facility] for a scheduled appointment. Patient reported to have hernia repair surgery April 15th approximately- patient has JP drain to left abdomen with dark red output. Staff at [facility] states the patient was crying in pain in their waitingroom, but was not alert and oriented. Due to not getting a  report from [facility] staff, who were not present, EMS services were called. Patient obeys commands in triage and responds to her name, but patient is not oriented to time and situation in triage EXAM: CT ABDOMEN AND PELVIS WITH CONTRAST TECHNIQUE: Multidetector CT imaging of the abdomen and pelvis was performed using the standard protocol following bolus administration of intravenous contrast. CONTRAST:  170mL ISOVUE-300 IOPAMIDOL (ISOVUE-300) INJECTION 61% COMPARISON:  10/19/2017 FINDINGS: Lower chest: Lung base reticular opacities are noted bilaterally consistent with subsegmental atelectasis. Lower lobe bronchial wall thickening. No evidence of pneumonia or pulmonary edema. No pleural effusion. Heart is enlarged. Hepatobiliary: Normal liver. There is material of increased echogenicity in  the gallbladder fundus associated with a small calcification. This may reflect an inherent mass. It could reflect focal adenomyosis. Subtle increased attenuation is noted in the dependent gallbladder which may reflect stones or sludge. There is no wall thickening or adjacent inflammation. No bile duct dilation. Pancreas: Unremarkable. No pancreatic ductal dilatation or surrounding inflammatory changes. Spleen: Normal in size without focal abnormality. Adrenals/Urinary Tract: No adrenal masses. 6 mm low-density mass in the upper pole the right kidney. 7 mm low-density mass in the posterior midpole of the left kidney. Both of these are stable consistent with cysts. No other renal masses, no stones and no hydronephrosis. Ureters are normal in course and in caliber. Bladder is unremarkable. Stomach/Bowel: Stomach is unremarkable. Small bowel is normal caliber. No wall thickening. There is inflammatory type stranding in the anterior peritoneal fat and inferior omentum in the left mid to lower abdomen which abuts a loop of small bowel. No colonic wall thickening or inflammation. Rectum is mild-to-moderately distended with  stool. There is mild increased stool burden in the right colon. Normal appendix is visualized. Vascular/Lymphatic: Aortic atherosclerosis. No enlarged abdominal or pelvic lymph nodes. Reproductive: 2 cm left anterior upper uterine segment subserosal fibroid. No adnexal/ovarian masses. Other: Surgical drain lies within the deep subcutaneous fat of the left lower quadrant. There is inflammatory type stranding surrounding the drain, but no defined collection to suggest an abscess. Small residual defect is seen in the fascia of the left lower quadrant through which a small amount of fat herniates. No bowel enters this. No ascites. Musculoskeletal: No fracture or acute finding. No osteoblastic or osteolytic lesions. IMPRESSION: 1. There are postsurgical changes in the left lower abdomen from the recent hernia repair. Inflammatory type stranding is seen in the fat of the lower anterior abdominal wall and within the underlying anterior mesentery and omentum. However, there is no defined collection to suggest an abscess. 2. Small residual fascial defect is seen in the left lower quadrant, just to the left of midline. Only a small amount of fat herniates through this. No bowel. 3. Mild increased stool burden in the colon. No bowel obstruction or inflammation. 4. Mild aortic atherosclerosis. Electronically Signed   By: Lajean Manes M.D.   On: 11/02/2017 14:10     Scheduled Meds: . calcium-vitamin D  1 tablet Oral BID  . carvedilol  25 mg Oral BID WC  . cloNIDine  0.3 mg Transdermal Weekly  . cloNIDine  0.3 mg Oral BID  . furosemide  20 mg Oral Daily  . heparin  5,000 Units Subcutaneous Q8H  . multivitamin with minerals  1 tablet Oral Daily  . potassium chloride  40 mEq Oral Daily  . sodium chloride flush  3 mL Intravenous Q12H   Continuous Infusions: . sodium chloride    . sodium chloride 75 mL/hr at 11/04/17 0520  . cefTRIAXone (ROCEPHIN)  IV Stopped (11/04/17 1030)     LOS: 1 day   Time spent: 50  minutes  Angelica Chessman, MD, FACP, CPE  [facility] If 7PM-7AM, please contact night-coverage [email or web address] Password [email or web address] 11/04/2017, 1:13 PM

## 2017-11-04 NOTE — Progress Notes (Signed)
Pharmacy Antibiotic Note  Amanda Roman is a 77 y.o. female admitted on 11/02/2017 with sepsis.  Pharmacy has been consulted for vancomycin and cefepime dosing.  Day #2 antibiotics - Ceftriaxone 2g q24h Tmax 100.2 WBC 7.3 on 4/27 SCr 0.56, CrCl ~50 ml/min Blood cx Citrobacter koseri (sensitivities pending)  Plan: Cefepime 2g IV q8h Vancomycin 1500mg  IV x 1, then 1g IV q48h for estimated AUC 431 Check vancomycin levels at steady state, goal AUC 400-500 Follow up renal function & cultures  Height: 5' (152.4 cm) Weight: 155 lb 13.8 oz (70.7 kg) IBW/kg (Calculated) : 45.5  Temp (24hrs), Avg:99.3 F (37.4 C), Min:97.9 F (36.6 C), Max:100.1 F (37.8 C)  Recent Labs  Lab 11/02/17 1202 11/02/17 1206 11/03/17 0444 11/04/17 0510 11/04/17 0550  WBC 8.9  --  7.3  --   --   CREATININE 0.63 0.70 0.58 0.56  --   LATICACIDVEN  --  0.91  --   --  0.6    Estimated Creatinine Clearance: 50.9 mL/min (by C-G formula based on SCr of 0.56 mg/dL).    Allergies  Allergen Reactions  . Ace Inhibitors Other (See Comments)    angioedema  . Penicillins Itching    Has patient had a PCN reaction causing immediate rash, facial/tongue/throat swelling, SOB or lightheadedness with hypotension: Yes Has patient had a PCN reaction causing severe rash involving mucus membranes or skin necrosis: No Has patient had a PCN reaction that required hospitalization No Has patient had a PCN reaction occurring within the last 10 years: No If all of the above answers are "NO", then may proceed with Cephalosporin use.  . Acetaminophen-Codeine Rash  . Codeine Rash    Antimicrobials this admission:  4/27 ceftriaxone >> 4/28 4/28 vancomycin >> 4/28 cefepime >>  Dose adjustments this admission:    Microbiology results:  4/26 BCx: 1/2 Citrobacter koseri (BCID - enterobacteriaceae) 4/26 UCx: 4/28 BCx:  Thank you for allowing pharmacy to be a part of this patient's care.  Peggyann Juba, PharmD,  BCPS Pager: [phone] 11/04/2017 7:33 PM

## 2017-11-05 ENCOUNTER — Inpatient Hospital Stay (HOSPITAL_COMMUNITY): Payer: Medicare Other

## 2017-11-05 ENCOUNTER — Encounter (HOSPITAL_COMMUNITY): Payer: Self-pay

## 2017-11-05 DIAGNOSIS — R7881 Bacteremia: Secondary | ICD-10-CM

## 2017-11-05 DIAGNOSIS — R1310 Dysphagia, unspecified: Secondary | ICD-10-CM

## 2017-11-05 LAB — CREATININE, SERUM
CREATININE: 0.49 mg/dL (ref 0.44–1.00)
GFR calc Af Amer: >60 mL/min (ref >60)
GFR calc non Af Amer: >60 mL/min (ref >60)

## 2017-11-05 LAB — CULTURE, BLOOD (ROUTINE X 2): Special Requests: ADEQUATE

## 2017-11-05 MED ORDER — SODIUM CHLORIDE 0.9 % IV SOLN
2.0000 g | INTRAVENOUS | Status: DC
  Administered 2017-11-05 – 2017-11-06 (×2): 2 g via INTRAVENOUS
  Filled 2017-11-05: qty 20
  Filled 2017-11-05 (×2): qty 2

## 2017-11-05 MED ORDER — AMLODIPINE BESYLATE 10 MG PO TABS
10.0000 mg | ORAL_TABLET | Freq: Every day | ORAL | Status: DC
  Administered 2017-11-05 – 2017-11-07 (×3): 10 mg via ORAL
  Filled 2017-11-05 (×3): qty 1

## 2017-11-05 MED ORDER — POLYETHYLENE GLYCOL 3350 17 G PO PACK
17.0000 g | PACK | Freq: Two times a day (BID) | ORAL | Status: DC
  Administered 2017-11-05 – 2017-11-06 (×2): 17 g via ORAL
  Filled 2017-11-05 (×2): qty 1

## 2017-11-05 MED ORDER — LABETALOL HCL 5 MG/ML IV SOLN
10.0000 mg | Freq: Once | INTRAVENOUS | Status: AC
  Administered 2017-11-05: 10 mg via INTRAVENOUS
  Filled 2017-11-05: qty 4

## 2017-11-05 MED ORDER — HYDRALAZINE HCL 20 MG/ML IJ SOLN: 10.0000 mg | Freq: Four times a day (QID) | INTRAMUSCULAR | Status: DC | PRN

## 2017-11-05 NOTE — Care Management Note (Signed)
Case Management Note  Patient Details  Name: EVOLET SALMINEN MRN: [id number] Date of Birth: [birth year]  Subjective/Objective:  77 y/o f admitted w/metabolic encephalopathy. Hx: Dementia. Readmit. From SNF.  CSW following for SNF.                Action/Plan:d/c SNF.   Expected Discharge Date:  (unknown)               Expected Discharge Plan:  Skilled Nursing Facility  In-House Referral:  Clinical Social Work  Discharge planning Services  CM Consult  Post Acute Care Choice:    Choice offered to:     DME Arranged:    DME Agency:     HH Arranged:    [facility] Agency:     Status of Service:  In process, will continue to follow  If discussed at Long Length of Stay Meetings, dates discussed:    Additional Comments:  Dessa Phi, RN 11/05/2017, 12:10 PM

## 2017-11-05 NOTE — Progress Notes (Signed)
Patient arrived to 1320 via bed. Alert and oriented to self,birthday,month and location. VSS on room air. Foley cath in place. Abdominal dressing CDI. Patient oriented to new room. Bed Alarm in place.

## 2017-11-05 NOTE — Progress Notes (Signed)
PROGRESS NOTE  Amanda Roman CVE:[id number] DOB: Jan 19, 1941 DOA: 11/02/2017 PCP: Oval Linsey, MD  Brief Narrative: 77 year old woman status post recent ventral hernia repair, was seen at surgery clinic but noted to be confused, had no staff with her from nursing facility, and therefore was sent to the emergency department.  She was admitted for acute metabolic encephalopathy thought secondary to UTI.  Assessment/Plan Citrobacter koseri UTI pansensitive with associated bacteremia and acute metabolic encephalopathy.  No evidence of sepsis. --Remains afebrile.  Narrow to ceftriaxone based on blood culture results.  Follow-up urine culture, but same organism, expect same sensitivities. --Delirium superimposed on mild dementia.  Expect to slowly improve.  Patient awake, alert and follows simple commands.  Denies any focal neurologic deficits.  Will cancel MRI, not likely to be fruitful.  Do not think neurology consultation indicated at this point.  Essential hypertension --Poor control.  Resume amlodipine.  Continue carvedilol, clonidine, Lasix.  Dysphagia.   -- Dysphasia 3 diet, thin liquids per speech therapy  Status post primary repair incarcerated incisional hernia with small bowel obstruction 4/13.  CT scan no evidence of acute complications.  Surgery assessed, noted that wound was healing well.  Staples were removed.  JP drain removed.  Has scheduled follow-up with Dr. Brantley Stage 5/6.  Mild dementia by history --Stable   Dyspnea improving.  Anticipate discharge next 48 hours.  Discussed with daughter and son-in-law at bedside.  DVT prophylaxis: heparin Code Status: full Family Communication:  Disposition Plan: SNF   Murray Hodgkins, MD  [facility] Direct contact: [phone] --[place]ia [place]  --[email or web address]; password [email or web address]  7PM-7AM contact night coverage as above 11/05/2017, 5:33 PM  LOS: 2 days   Consultants:    Procedures:    Antimicrobials:  Cefepime  4/28 >> 4/29  Ceftriaxone 4/26, 4/27, 4/30 >>  Interval history/Subjective: Confused but feels okay.  No complaints.  Objective: Vitals:  Vitals:   11/05/17 0838 11/05/17 1404  BP: (!) 178/88 (!) 170/83  Pulse: 80 69  Resp: 18 18  Temp:  98.1 F (36.7 C)  SpO2: 100% 100%    Exam:  Constitutional:  . Appears calm and comfortable Eyes:  . pupils and irises appear normal ENMT:  . grossly normal hearing  Respiratory:  . CTA bilaterally, no w/r/r.  . Respiratory effort normal. Cardiovascular:  . RRR, no m/r/g . No LE extremity edema   Musculoskeletal:  . RUE, LUE, RLE, LLE   o strength globally diminished but appear symmetric, tone normal, no atrophy, no abnormal movements Psychiatric:  . Mental status o Mood, affect appropriate . Confused, disoriented   I have personally reviewed the following:   Labs:  ABG yesterday afternoon was unremarkable  BMP, LFTs unremarkable yesterday  Last CBC 4/27 was unremarkable  Imaging studies:  Chest x-ray yesterday no acute disease   Scheduled Meds: . amLODipine  10 mg Oral Daily  . calcium-vitamin D  1 tablet Oral BID  . carvedilol  25 mg Oral BID WC  . cloNIDine  0.3 mg Transdermal Weekly  . furosemide  20 mg Oral Daily  . heparin  5,000 Units Subcutaneous Q8H  . multivitamin with minerals  1 tablet Oral Daily  . polyethylene glycol  17 g Oral BID  . potassium chloride  40 mEq Oral Daily  . sodium chloride flush  3 mL Intravenous Q12H   Continuous Infusions: . sodium chloride    . cefTRIAXone (ROCEPHIN)  IV    . [START ON 11/06/2017] vancomycin  Principal Problem:   Bacteremia Active Problems:   Essential hypertension   Metabolic encephalopathy   Dementia   Lower urinary tract infectious disease   Dysphagia   LOS: 2 days

## 2017-11-05 NOTE — Progress Notes (Signed)
Patient up watching TV right now.  She is still confused but is talking in sentences.

## 2017-11-05 NOTE — Clinical Social Work Note (Signed)
Patient assessed on 4/17. Patient dc'd to [facility]. Patient has been there for about 2 weeks.  LCSW met with patient at bedside and spoke with sister, Amanda Roman by phone. Patient and family agreeable to return to [facility] at [facility].  PLAN: SNF at [facility].    Clinical Social Work Assessment  Patient Details  Name: Amanda Roman MRN: [id number] Date of Birth: [birth year]  Date of referral:                  Reason for consult:                   Permission sought to share information with:    Permission granted to share information::     Name::        Agency::     Relationship::     Contact Information:     Housing/Transportation Living arrangements for the past 2 months:    Source of Information:    Patient Interpreter Needed:    Criminal Activity/Legal Involvement Pertinent to Current Situation/Hospitalization:    Significant Relationships:    Lives with:    Do you feel safe going back to the place where you live?    Need for family participation in patient care:     Care giving concerns: Pt lives in an apartment by herself, she does not currently have 24/7 caregivers. SNF recommended before returning home.    Social Worker assessment / plan:CSW met with pt at bedside, initially she was going to return home with home health however would now like to go to a SNF prior to returning home. Pt states that she lives by herself, and has sisters that help her out. Pt is one of 8 children and gave permission for CSW to speak with sister Amanda Roman. Pt states she has never been to a nursing home before and would like to see her options to make the decision. Pt had no further concerns or complaints for this Probation officer. CSW will send out fl2 and present pt with options.   Employment status:    Insurance information:    PT Recommendations:    Information / Referral to community resources:     Patient/Family's Response to care: Pt calm and quiet, amenable to SNF recommendation.  States understanding of CSW role and SNF process.  Patient/Family's Understanding of and Emotional Response to Diagnosis, Current Treatment, and Prognosis:  Pt states understanding of her diagnosis, current treatment and prognosis. Pt states that "I feel fine," and expresses reasonable understanding of her limitations and need for care greater than she could provide to herself at home.   Emotional Assessment Appearance:    Attitude/Demeanor/Rapport:    Affect (typically observed):    Orientation:    Alcohol / Substance use:    Psych involvement (Current and /or in the community):     Discharge Needs  Concerns to be addressed:    Readmission within the last 30 days:    Current discharge risk:    Barriers to Discharge:      Servando Snare, LCSW 11/05/2017, 4:07 PM

## 2017-11-06 DIAGNOSIS — R410 Disorientation, unspecified: Secondary | ICD-10-CM

## 2017-11-06 DIAGNOSIS — G9341 Metabolic encephalopathy: Secondary | ICD-10-CM

## 2017-11-06 DIAGNOSIS — R7881 Bacteremia: Secondary | ICD-10-CM

## 2017-11-06 DIAGNOSIS — F0390 Unspecified dementia without behavioral disturbance: Secondary | ICD-10-CM

## 2017-11-06 DIAGNOSIS — R1310 Dysphagia, unspecified: Secondary | ICD-10-CM

## 2017-11-06 DIAGNOSIS — N390 Urinary tract infection, site not specified: Secondary | ICD-10-CM

## 2017-11-06 DIAGNOSIS — I10 Essential (primary) hypertension: Secondary | ICD-10-CM

## 2017-11-06 LAB — BASIC METABOLIC PANEL
Anion gap: 12 (ref 5–15)
BUN: 7 mg/dL (ref 6–20)
CHLORIDE: 101 mmol/L (ref 101–111)
CO2: 27 mmol/L (ref 22–32)
Calcium: 9.5 mg/dL (ref 8.9–10.3)
Creatinine, Ser: 0.45 mg/dL (ref 0.44–1.00)
GFR calc Af Amer: >60 mL/min (ref >60)
GFR calc non Af Amer: >60 mL/min (ref >60)
GLUCOSE: 110 mg/dL — AB (ref 65–99)
POTASSIUM: 3.5 mmol/L (ref 3.5–5.1)
Sodium: 140 mmol/L (ref 135–145)

## 2017-11-06 LAB — CBC
HEMATOCRIT: 33.6 % — AB (ref 36.0–46.0)
Hemoglobin: 11.2 g/dL — ABNORMAL LOW (ref 12.0–15.0)
MCH: 30.8 pg (ref 26.0–34.0)
MCHC: 33.3 g/dL (ref 30.0–36.0)
MCV: 92.3 fL (ref 78.0–100.0)
Platelets: 431 10*3/uL — ABNORMAL HIGH (ref 150–400)
RBC: 3.64 MIL/uL — ABNORMAL LOW (ref 3.87–5.11)
RDW: 12.7 % (ref 11.5–15.5)
WBC: 6.4 10*3/uL (ref 4.0–10.5)

## 2017-11-06 LAB — URINE CULTURE: Culture: NO GROWTH

## 2017-11-06 NOTE — Progress Notes (Addendum)
PROGRESS NOTE    Amanda Roman  TDV:[id number] DOB: Nov 05, 1940 DOA: 11/02/2017 PCP: Oval Linsey, MD   Brief Narrative:  HPI on 11/02/2017 by Dr. Velvet Bathe Amanda Roman is a 77 y.o. female with medical history significant of dementia, hypertension, recent ventral hernia repair. Much of the history is obtained by sister at bedside as patient is somnolent and unable to provide history.  The sister reports that within one day she noticed a difference in her sister's behavior.The patient was unable to recognize her and was less active than usual.  Interim history Admitted for acute metabolic encephalopathy thought to be secondary to UTI Assessment & Plan   Severe sepsis secondary to Citrobacter koseri UTI and bacteremia -On admission, patient was tachycardic with tachypnea and altered mental status -Currently vital signs are stable -UA on admission showed greater than 50 WBC, rare bacteria, large leukocytes -Blood cultures 11/02/17 detected Enterobacteriaceae species- Citrobacter koseri (pansensitive) -repeat blood cultures on 11/04/17 show no growth to date -Urine culture 70k citrobacter koseri (pansensitive) -Patient initially placed on vancomycin and cefepime -will discontinue vancomycin -continue ceftraixone  Acute metabolic encephalopathy/delirium -Secondary to the above -Unknown patient's baseline -Currently awake and alert oriented x2 -Suspect delirium superimposed on mild dementia -No neurological deficits  Incarcerated incisional hernia with small bowel obstruction -Patient with recent repair on 10/20/2017 -CT abdomen pelvis shows no acute complications -General surgery consulted and appreciated, staples and JP drain removed, outpatient follow-up with Dr. Brantley Stage on 11/12/2017  Essential hypertension -Continue amlodipine, Coreg, clonidine, Lasix  Dysphagia -Speech therapy consulted, recommended dysphagia 3 diet with thin liquids  Dementia -stable, on no home  medications  DVT Prophylaxis  Heparin  Code Status: Full  Family Communication: None at baseline  Disposition Plan: Admitted, Dispo pending, will order PT/OT. Suspect rehab in the next 1-2 days.  Consultants General surgery  Procedures  None  Antibiotics   Anti-infectives (From admission, onward)   Start     Dose/Rate Route Frequency Ordered Stop   11/06/17 2000  vancomycin (VANCOCIN) IVPB 1000 mg/200 mL premix  Status:  Discontinued     1,000 mg 200 mL/hr over 60 Minutes Intravenous Every 48 hours 11/04/17 1905 11/06/17 1138   11/05/17 2000  cefTRIAXone (ROCEPHIN) 2 g in sodium chloride 0.9 % 100 mL IVPB     2 g 200 mL/hr over 30 Minutes Intravenous Every 24 hours 11/05/17 1724     11/04/17 2000  ceFEPIme (MAXIPIME) 2 g in sodium chloride 0.9 % 100 mL IVPB  Status:  Discontinued     2 g 200 mL/hr over 30 Minutes Intravenous Every 8 hours 11/04/17 1933 11/05/17 1724   11/04/17 1930  vancomycin (VANCOCIN) 1,500 mg in sodium chloride 0.9 % 500 mL IVPB     1,500 mg 250 mL/hr over 120 Minutes Intravenous  Once 11/04/17 1904 11/04/17 2124   11/04/17 1900  piperacillin-tazobactam (ZOSYN) IVPB 3.375 g  Status:  Discontinued     3.375 g 100 mL/hr over 30 Minutes Intravenous Every 6 hours 11/04/17 1855 11/04/17 1859   11/04/17 1900  vancomycin (VANCOCIN) IVPB 1000 mg/200 mL premix  Status:  Discontinued     1,000 mg 200 mL/hr over 60 Minutes Intravenous Every 24 hours 11/04/17 1855 11/04/17 1858   11/03/17 1400  cefTRIAXone (ROCEPHIN) 1 g in sodium chloride 0.9 % 100 mL IVPB  Status:  Discontinued     1 g 200 mL/hr over 30 Minutes Intravenous Every 24 hours 11/02/17 1500 11/03/17 0804   11/03/17 1000  cefTRIAXone (  ROCEPHIN) 2 g in sodium chloride 0.9 % 100 mL IVPB  Status:  Discontinued     2 g 200 mL/hr over 30 Minutes Intravenous Every 24 hours 11/03/17 0804 11/04/17 1855   11/02/17 1430  meropenem (MERREM) 1 g in sodium chloride 0.9 % 100 mL IVPB  Status:  Discontinued     1  g 200 mL/hr over 30 Minutes Intravenous Every 8 hours 11/02/17 1404 11/02/17 1414   11/02/17 1415  cefTRIAXone (ROCEPHIN) 1 g in sodium chloride 0.9 % 100 mL IVPB     1 g 200 mL/hr over 30 Minutes Intravenous  Once 11/02/17 1414 11/02/17 1628      Subjective:   [name] seen and examined today.  Complains of mild abdominal pain.  Denies chest pain, shortness of breath, nausea, vomiting, headache or dizziness.  States she is in the hospital because she "was acting up."  Objective:   Vitals:   11/05/17 1830 11/05/17 2056 11/05/17 2352 11/06/17 0640  BP: (!) 155/70 (!) 151/78 (!) 162/76 (!) 165/74  Pulse:  79 83 78  Resp:  20 15 15   Temp:  99.8 F (37.7 C) 98.8 F (37.1 C) 99 F (37.2 C)  TempSrc:  Oral Oral Oral  SpO2:  97% 97% 97%  Weight:      Height:        Intake/Output Summary (Last 24 hours) at 11/06/2017 1138 Last data filed at 11/06/2017 1054 Gross per 24 hour  Intake 860 ml  Output 2795 ml  Net -1935 ml   Filed Weights   11/02/17 1135 11/02/17 2200  Weight: 74.8 kg (165 lb) 70.7 kg (155 lb 13.8 oz)    Exam  General: Well developed, well nourished, NAD, appears stated age  HEENT: NCAT, mucous membranes moist.   Neck: Supple  Cardiovascular: S1 S2 auscultated, RRR, +2/6 SEM  Respiratory: Clear to auscultation bilaterally with equal chest rise  Abdomen: Soft, nontender, nondistended, + bowel sounds, healing midline incision  Extremities: warm dry without cyanosis clubbing or edema  Neuro: AAOx2, Nonfocal (history of dementia)  Psych: Normal affect and demeanor with intact judgement and insight   Data Reviewed: I have personally reviewed following labs and imaging studies  CBC: Recent Labs  Lab 11/02/17 1202 11/02/17 1206 11/03/17 0444 11/06/17 0635  WBC 8.9  --  7.3 6.4  NEUTROABS 6.4  --   --   --   HGB 12.3 12.6 11.3* 11.2*  HCT 35.8* 37.0 33.6* 33.6*  MCV 92.5  --  93.1 92.3  PLT 449*  --  414* 474*   Basic Metabolic  Panel: Recent Labs  Lab 11/02/17 1202 11/02/17 1206 11/03/17 0444 11/04/17 0510 11/05/17 0520 11/06/17 0635  NA 140 142 143 142  --  140  K 3.6 3.6 3.5 4.0  --  3.5  CL 101 101 105 105  --  101  CO2 28  --  26 27  --  27  GLUCOSE 122* 124* 106* 112*  --  110*  BUN 10 9 10 11   --  7  CREATININE 0.63 0.70 0.58 0.56 0.49 0.45  CALCIUM 9.5  --  9.5 9.9  --  9.5  MG 1.8  --   --   --   --   --    GFR: Estimated Creatinine Clearance: 50.9 mL/min (by C-G formula based on SCr of 0.45 mg/dL). Liver Function Tests: Recent Labs  Lab 11/02/17 1202 11/04/17 0510  AST 35 29  ALT 48 39  ALKPHOS  69 58  BILITOT 0.7 0.6  PROT 8.1 7.1  ALBUMIN 3.1* 2.7*   No results for input(s): LIPASE, AMYLASE in the last 168 hours. No results for input(s): AMMONIA in the last 168 hours. Coagulation Profile: Recent Labs  Lab 11/02/17 1202  INR 1.02   Cardiac Enzymes: No results for input(s): CKTOTAL, CKMB, CKMBINDEX, TROPONINI in the last 168 hours. BNP (last 3 results) No results for input(s): PROBNP in the last 8760 hours. HbA1C: No results for input(s): HGBA1C in the last 72 hours. CBG: Recent Labs  Lab 11/04/17 0627  GLUCAP 107*   Lipid Profile: No results for input(s): CHOL, HDL, LDLCALC, TRIG, CHOLHDL, LDLDIRECT in the last 72 hours. Thyroid Function Tests: No results for input(s): TSH, T4TOTAL, FREET4, T3FREE, THYROIDAB in the last 72 hours. Anemia Panel: No results for input(s): VITAMINB12, FOLATE, FERRITIN, TIBC, IRON, RETICCTPCT in the last 72 hours. Urine analysis:    Component Value Date/Time   COLORURINE STRAW (A) 11/02/2017 1318   APPEARANCEUR HAZY (A) 11/02/2017 1318   LABSPEC 1.004 (L) 11/02/2017 1318   PHURINE 8.0 11/02/2017 1318   GLUCOSEU NEGATIVE 11/02/2017 1318   HGBUR SMALL (A) 11/02/2017 1318   BILIRUBINUR NEGATIVE 11/02/2017 1318   KETONESUR NEGATIVE 11/02/2017 1318   PROTEINUR NEGATIVE 11/02/2017 1318   NITRITE NEGATIVE 11/02/2017 1318   LEUKOCYTESUR  LARGE (A) 11/02/2017 1318   Sepsis Labs: @LABRCNTIP (procalcitonin:4,lacticidven:4)  ) Recent Results (from the past 240 hour(s))  Culture, blood (Routine x 2)     Status: None (Preliminary result)   Collection Time: 11/02/17 11:42 AM  Result Value Ref Range Status   Specimen Description   Final    BLOOD LEFT ANTECUBITAL Performed at [facility], [place] [address], [place], [place] [address]    Special Requests   Final    BOTTLES DRAWN AEROBIC AND ANAEROBIC Blood Culture results may not be optimal due to an inadequate volume of blood received in culture bottles Performed at [place] [address], [place], [place] [address]    Culture   Final    NO GROWTH 4 DAYS Performed at [facility], [place] [address], [place], [place] [address]    Report Status PENDING  Incomplete  Culture, blood (Routine x 2)     Status: Abnormal   Collection Time: 11/02/17 12:03 PM  Result Value Ref Range Status   Specimen Description   Final    BLOOD RIGHT ANTECUBITAL Performed at [place] [address], [place], [place] [address]    Special Requests   Final    BOTTLES DRAWN AEROBIC AND ANAEROBIC Blood Culture adequate volume Performed at [place] [address], [place], [place] [address]    Culture  Setup Time   Final    GRAM NEGATIVE RODS AEROBIC BOTTLE ONLY CRITICAL RESULT CALLED TO, READ BACK BY AND VERIFIED WITH: D ZEIGLER,PHARMD AT 4315 11/03/17 BY L BENFIELD Performed at [facility], [place] [address] [place],  [address]    Culture CITROBACTER KOSERI (A)  Final   Report Status 11/05/2017 FINAL  Final   Organism ID, Bacteria CITROBACTER KOSERI  Final      Susceptibility   Citrobacter koseri - MIC*    CEFAZOLIN <=4 SENSITIVE Sensitive     CEFEPIME <=1 SENSITIVE Sensitive     CEFTAZIDIME <=1 SENSITIVE Sensitive     CEFTRIAXONE <=1 SENSITIVE Sensitive     CIPROFLOXACIN <=0.25 SENSITIVE  Sensitive     GENTAMICIN <=1  SENSITIVE Sensitive     IMIPENEM <=0.25 SENSITIVE Sensitive     TRIMETH/SULFA <=20 SENSITIVE Sensitive     PIP/TAZO <=4 SENSITIVE Sensitive     * CITROBACTER KOSERI  Blood Culture ID Panel (Reflexed)     Status: Abnormal   Collection Time: 11/02/17 12:03 PM  Result Value Ref Range Status   Enterococcus species NOT DETECTED NOT DETECTED Final   Listeria monocytogenes NOT DETECTED NOT DETECTED Final   Staphylococcus species NOT DETECTED NOT DETECTED Final   Staphylococcus aureus NOT DETECTED NOT DETECTED Final   Streptococcus species NOT DETECTED NOT DETECTED Final   Streptococcus agalactiae NOT DETECTED NOT DETECTED Final   Streptococcus pneumoniae NOT DETECTED NOT DETECTED Final   Streptococcus pyogenes NOT DETECTED NOT DETECTED Final   Acinetobacter baumannii NOT DETECTED NOT DETECTED Final   Enterobacteriaceae species DETECTED (A) NOT DETECTED Final    Comment: Enterobacteriaceae represent a large family of gram negative bacteria, not a single organism. Refer to culture for further identification. CRITICAL RESULT CALLED TO, READ BACK BY AND VERIFIED WITH: D ZEIGLER,PHARMD AT 1601 11/03/17 BY L BENFIELD    Enterobacter cloacae complex NOT DETECTED NOT DETECTED Final   Escherichia coli NOT DETECTED NOT DETECTED Final   Klebsiella oxytoca NOT DETECTED NOT DETECTED Final   Klebsiella pneumoniae NOT DETECTED NOT DETECTED Final   Proteus species NOT DETECTED NOT DETECTED Final   Serratia marcescens NOT DETECTED NOT DETECTED Final   Carbapenem resistance NOT DETECTED NOT DETECTED Final   Haemophilus influenzae NOT DETECTED NOT DETECTED Final   Neisseria meningitidis NOT DETECTED NOT DETECTED Final   Pseudomonas aeruginosa NOT DETECTED NOT DETECTED Final   Candida albicans NOT DETECTED NOT DETECTED Final   Candida glabrata NOT DETECTED NOT DETECTED Final   Candida krusei NOT DETECTED NOT DETECTED Final   Candida parapsilosis NOT DETECTED NOT DETECTED Final    Candida tropicalis NOT DETECTED NOT DETECTED Final    Comment: Performed at [facility], [place] [address], [place], [place] [address]  Urine culture     Status: Abnormal   Collection Time: 11/02/17  1:18 PM  Result Value Ref Range Status   Specimen Description   Final    URINE, RANDOM Performed at [place] [address], [place], [place] [address]    Special Requests   Final    NONE Performed at [facility], [place] [address] [place], [place] [address]    Culture 70,000 COLONIES/mL CITROBACTER KOSERI (A)  Final   Report Status 11/06/2017 FINAL  Final   Organism ID, Bacteria CITROBACTER KOSERI (A)  Final      Susceptibility   Citrobacter koseri - MIC*    CEFAZOLIN <=4 SENSITIVE Sensitive     CEFTRIAXONE <=1 SENSITIVE Sensitive     CIPROFLOXACIN <=0.25 SENSITIVE Sensitive     GENTAMICIN <=1 SENSITIVE Sensitive     IMIPENEM <=0.25 SENSITIVE Sensitive     NITROFURANTOIN 32 SENSITIVE Sensitive     TRIMETH/SULFA <=20 SENSITIVE Sensitive     PIP/TAZO <=4 SENSITIVE Sensitive     * 70,000 COLONIES/mL CITROBACTER KOSERI  Culture, Urine     Status: None   Collection Time: 11/04/17  3:17 PM  Result Value Ref Range Status   Specimen Description   Final    URINE, CATHETERIZED Performed at [facility], [place] [address] [place], [place] [address]    Special Requests   Final    NONE Performed at [facility], [place] [address]  [place], [place] [address]    Culture   Final    NO GROWTH Performed at [facility], [place] [address], [place], [place] [address]    Report Status 11/06/2017 FINAL  Final  Culture, blood (routine x 2)     Status: None (Preliminary result)   Collection Time: 11/04/17  3:54 PM  Result Value Ref Range Status   Specimen Description   Final    BLOOD RIGHT HAND Performed at [place] [address], [place], [place] [address]    Special Requests   Final     BOTTLES DRAWN AEROBIC AND ANAEROBIC Blood Culture results may not be optimal due to an excessive volume of blood received in culture bottles Performed at [place] [address] [place], [place] [address]    Culture   Final    NO GROWTH 2 DAYS Performed at [place] [address] [place], [place] [address]    Report Status PENDING  Incomplete  Culture, blood (routine x 2)     Status: None (Preliminary result)   Collection Time: 11/04/17  3:57 PM  Result Value Ref Range Status   Specimen Description   Final    LEFT ANTECUBITAL Performed at [place] [address], [place], [place] [address]    Special Requests   Final    BOTTLES DRAWN AEROBIC AND ANAEROBIC Blood Culture adequate volume Performed at [place] [address] [place], [place] [address]    Culture   Final    NO GROWTH 2 DAYS Performed at [place] [address] [place], [place] [address]    Report Status PENDING  Incomplete      Radiology Studies: Dg Chest 2 View  Result Date: 11/05/2017 CLINICAL DATA:  Aspiration of food. EXAM: CHEST - 2 VIEW COMPARISON:  11/02/2017 FINDINGS: Stable mild cardiac enlargement. There is no evidence of pulmonary edema, consolidation, pneumothorax, nodule or pleural fluid. IMPRESSION: No active cardiopulmonary disease. Electronically Signed   By: Aletta Edouard M.D.   On: 11/05/2017 08:31     Scheduled Meds: . amLODipine  10 mg Oral Daily  . calcium-vitamin D  1 tablet Oral BID  . carvedilol  25 mg Oral BID WC  . cloNIDine  0.3 mg Transdermal Weekly  . furosemide  20 mg Oral Daily  . heparin  5,000 Units Subcutaneous Q8H  . multivitamin with minerals  1 tablet Oral Daily  . polyethylene glycol  17 g Oral BID  . potassium chloride  40 mEq Oral Daily  . sodium chloride flush  3 mL Intravenous Q12H   Continuous Infusions: . sodium chloride    . cefTRIAXone (ROCEPHIN)  IV Stopped  (11/05/17 2209)     LOS: 3 days   Time Spent in minutes   45 minutes (greater than 50% of time spent with patient face to face, as well as reviewing old records, and formulating a plan)  Konni Kesinger D.O. on 11/06/2017 at 11:38 AM  Between 7am to 7pm - Pager - [phone]  After 7pm go to [email or web address] - password [email or web address]  And look for the night coverage person covering for me after hours  [facility] Office  [phone]

## 2017-11-06 NOTE — Consult Note (Signed)
   [facility] CM Inpatient Consult   11/06/2017  Amanda Roman [birth year] [id number]   Patient screened for potential [facility] Care Management services due to recent hospital readmission.  Chart reviewed. Noted discharge plan appears to return to SNF.   No identifiable [facility] Care Management  needs at this time.    Marthenia Rolling, MSN-Ed, RN,BSN [facility] Liaison [phone]

## 2017-11-06 NOTE — Care Management Important Message (Signed)
Important Message  Patient Details  Name: Amanda Roman MRN: [id number] Date of Birth: 11/27/1940   Medicare Important Message Given:  Yes    Kerin Salen 11/06/2017, 11:26 AMImportant Message  Patient Details  Name: Amanda Roman MRN: [id number] Date of Birth: 11-17-1940   Medicare Important Message Given:  Yes    Kerin Salen 11/06/2017, 11:26 AM

## 2017-11-06 NOTE — Progress Notes (Signed)
CSW following for pt. Discharge needs to SNF. PT/ OT  ordered.

## 2017-11-07 DIAGNOSIS — Z743 Need for continuous supervision: Secondary | ICD-10-CM | POA: Diagnosis not present

## 2017-11-07 DIAGNOSIS — K436 Other and unspecified ventral hernia with obstruction, without gangrene: Secondary | ICD-10-CM | POA: Diagnosis not present

## 2017-11-07 DIAGNOSIS — R7881 Bacteremia: Secondary | ICD-10-CM | POA: Diagnosis not present

## 2017-11-07 DIAGNOSIS — K450 Other specified abdominal hernia with obstruction, without gangrene: Secondary | ICD-10-CM | POA: Diagnosis not present

## 2017-11-07 DIAGNOSIS — G9349 Other encephalopathy: Secondary | ICD-10-CM | POA: Diagnosis not present

## 2017-11-07 DIAGNOSIS — R410 Disorientation, unspecified: Secondary | ICD-10-CM | POA: Diagnosis not present

## 2017-11-07 DIAGNOSIS — F0390 Unspecified dementia without behavioral disturbance: Secondary | ICD-10-CM | POA: Diagnosis not present

## 2017-11-07 DIAGNOSIS — A419 Sepsis, unspecified organism: Secondary | ICD-10-CM

## 2017-11-07 DIAGNOSIS — N390 Urinary tract infection, site not specified: Secondary | ICD-10-CM | POA: Diagnosis not present

## 2017-11-07 DIAGNOSIS — L97929 Non-pressure chronic ulcer of unspecified part of left lower leg with unspecified severity: Secondary | ICD-10-CM | POA: Diagnosis not present

## 2017-11-07 DIAGNOSIS — R5383 Other fatigue: Secondary | ICD-10-CM | POA: Diagnosis not present

## 2017-11-07 DIAGNOSIS — M6281 Muscle weakness (generalized): Secondary | ICD-10-CM | POA: Diagnosis not present

## 2017-11-07 DIAGNOSIS — R262 Difficulty in walking, not elsewhere classified: Secondary | ICD-10-CM | POA: Diagnosis not present

## 2017-11-07 DIAGNOSIS — L98499 Non-pressure chronic ulcer of skin of other sites with unspecified severity: Secondary | ICD-10-CM | POA: Diagnosis not present

## 2017-11-07 DIAGNOSIS — R6520 Severe sepsis without septic shock: Secondary | ICD-10-CM

## 2017-11-07 DIAGNOSIS — R279 Unspecified lack of coordination: Secondary | ICD-10-CM | POA: Diagnosis not present

## 2017-11-07 DIAGNOSIS — I10 Essential (primary) hypertension: Secondary | ICD-10-CM | POA: Diagnosis not present

## 2017-11-07 DIAGNOSIS — E876 Hypokalemia: Secondary | ICD-10-CM | POA: Diagnosis not present

## 2017-11-07 DIAGNOSIS — K649 Unspecified hemorrhoids: Secondary | ICD-10-CM | POA: Diagnosis not present

## 2017-11-07 DIAGNOSIS — R5382 Chronic fatigue, unspecified: Secondary | ICD-10-CM | POA: Diagnosis not present

## 2017-11-07 DIAGNOSIS — R609 Edema, unspecified: Secondary | ICD-10-CM | POA: Diagnosis not present

## 2017-11-07 DIAGNOSIS — R1310 Dysphagia, unspecified: Secondary | ICD-10-CM | POA: Diagnosis not present

## 2017-11-07 DIAGNOSIS — I872 Venous insufficiency (chronic) (peripheral): Secondary | ICD-10-CM | POA: Diagnosis not present

## 2017-11-07 LAB — CREATININE, SERUM
CREATININE: 0.45 mg/dL (ref 0.44–1.00)
GFR calc Af Amer: >60 mL/min (ref >60)

## 2017-11-07 LAB — CULTURE, BLOOD (ROUTINE X 2): CULTURE: NO GROWTH

## 2017-11-07 MED ORDER — CEFUROXIME AXETIL 500 MG PO TABS: 500.0000 mg | ORAL_TABLET | Freq: Two times a day (BID) | ORAL | 0 refills | Status: AC

## 2017-11-07 NOTE — Evaluation (Signed)
Occupational Therapy Evaluation Patient Details Name: Amanda Roman MRN: [id number] DOB: 12/19/1940 Today's Date: 11/07/2017    History of Present Illness Pt was admitted with AMS.  Dx wtih bacteremia. Has severe sepsis and metabolic encephalopahty.  PMH:  dementia, recent ventral hernia repair, HTN, OA, venous insufficiency   Clinical Impression   This 77 year old female was admitted for the above.  No family available to verify home/PLOF.  Pt states she was mod I. She needs mostly min A at this time. Will follow in acute setting with supervision to min guard level goals.     Follow Up Recommendations  SNF    Equipment Recommendations  3 in 1 bedside commode;Other (comment)    Recommendations for Other Services       Precautions / Restrictions Precautions Precautions: Fall;Other (comment) Restrictions Weight Bearing Restrictions: No      Mobility Bed Mobility               General bed mobility comments: oob  Transfers   Equipment used: Rolling walker (2 wheeled)   Sit to Stand: Min assist         General transfer comment: assist to rise and steady    Balance     Sitting balance-Leahy Scale: Fair       Standing balance-Leahy Scale: Poor Standing balance comment: pt needs support of RW; able to release with one hand                           ADL either performed or assessed with clinical judgement   ADL   Eating/Feeding: Set up   Grooming: Wash/dry hands;Supervision/safety;Standing       Lower Body Bathing: Minimal assistance;Sit to/from stand       Lower Body Dressing: Moderate assistance;Sit to/from stand   Toilet Transfer: Minimal assistance;Ambulation;Comfort height toilet;RW   Toileting- Clothing Manipulation and Hygiene: Minimal assistance;Sit to/from stand         General ADL Comments: set up/supervison for UB ADLs     Vision         Perception     Praxis      Pertinent Vitals/Pain Pain Assessment:  No/denies pain     Hand Dominance Right   Extremity/Trunk Assessment Upper Extremity Assessment Upper Extremity Assessment: Generalized weakness           Communication Communication Communication: No difficulties   Cognition Arousal/Alertness: Awake/alert Behavior During Therapy: WFL for tasks assessed/performed Overall Cognitive Status: Within Functional Limits for tasks assessed                                     General Comments       Exercises     Shoulder Instructions      Home Living Family/patient expects to be discharged to:: Skilled nursing facility Living Arrangements: Alone                           Home Equipment: Kasandra Knudsen - single point;Walker - standard;Bedside commode          Prior Functioning/Environment Level of Independence: Independent with assistive device(s)                 OT Problem List: Decreased strength;Impaired balance (sitting and/or standing);Pain;Decreased safety awareness;Decreased activity tolerance;Decreased knowledge of use of DME or AE  OT Treatment/Interventions: Self-care/ADL training;Therapeutic exercise;Patient/family education;Balance training;Therapeutic activities;DME and/or AE instruction    OT Goals(Current goals can be found in the care plan section) Acute Rehab OT Goals OT Goal Formulation: With patient Time For Goal Achievement: 11/21/17 Potential to Achieve Goals: Good ADL Goals Pt Will Transfer to Toilet: with min guard assist;ambulating;bedside commode Pt Will Perform Toileting - Clothing Manipulation and hygiene: with supervision;sit to/from stand Additional ADL Goal #1: pt will complete LB adls with set up/supervision  OT Frequency: Min 2X/week   Barriers to D/C:            Co-evaluation              AM-PAC PT "6 Clicks" Daily Activity     Outcome Measure Help from another person eating meals?: A Little Help from another person taking care of personal  grooming?: A Little Help from another person toileting, which includes using toliet, bedpan, or urinal?: A Little Help from another person bathing (including washing, rinsing, drying)?: A Little Help from another person to put on and taking off regular upper body clothing?: A Little Help from another person to put on and taking off regular lower body clothing?: A Lot 6 Click Score: 17   End of Session    Activity Tolerance: Patient tolerated treatment well Patient left: in chair;with call bell/phone within reach;with chair alarm set  OT Visit Diagnosis: Unsteadiness on feet (R26.81);Muscle weakness (generalized) (M62.81)                Time: 7408-1448 OT Time Calculation (min): 20 min Charges:  OT General Charges $OT Visit: 1 Visit OT Evaluation $OT Eval Low Complexity: 1 Low G-Codes:     [place], OTR/L [phone] 11/07/2017  [name] 11/07/2017, 11:25 AM

## 2017-11-07 NOTE — Evaluation (Signed)
Physical Therapy Evaluation Patient Details Name: Amanda Roman MRN: [id number] DOB: Apr 22, 1941 Today's Date: 11/07/2017   History of Present Illness  Pt was admitted with AMS.  Dx wtih bacteremia. Has severe sepsis and metabolic encephalopahty.  PMH:  dementia, recent ventral hernia repair, HTN, OA, venous insufficiency  Clinical Impression  The patient is quiet and pleasant. Ambulated  With RW and 1 assist  X 60. Fatigued at end of walk. Chair brought up. Pt admitted with above diagnosis. Pt currently with functional limitations due to the deficits listed below (see PT Problem List). Pt will benefit from skilled PT to increase their independence and safety with mobility to allow discharge to the venue listed below.       Follow Up Recommendations SNF;Supervision for mobility/OOB    Equipment Recommendations  None recommended by PT    Recommendations for Other Services       Precautions / Restrictions Precautions Precautions: Fall;Other (comment) Restrictions Weight Bearing Restrictions: No      Mobility  Bed Mobility               General bed mobility comments: oob  Transfers   Equipment used: Rolling walker (2 wheeled)   Sit to Stand: Min assist         General transfer comment: assist to rise and steady  Ambulation/Gait Ambulation/Gait assistance: Min assist Ambulation Distance (Feet): 60 Feet Assistive device: Rolling walker (2 wheeled) Gait Pattern/deviations: Step-through pattern;Decreased stride length Gait velocity: decreased   General Gait Details: verbal cues for posture  Stairs            Wheelchair Mobility    Modified Rankin (Stroke Patients Only)       Balance     Sitting balance-Leahy Scale: Fair       Standing balance-Leahy Scale: Poor Standing balance comment: pt needs support of RW; able to release with one hand                             Pertinent Vitals/Pain Pain Assessment: No/denies pain Faces  Pain Scale: No hurt    Home Living Family/patient expects to be discharged to:: [place]: Kasandra Knudsen - single point;Walker - standard;Bedside commode      Prior Function Level of Independence: Independent with assistive device(s)               Hand Dominance   Dominant Hand: Right    Extremity/Trunk Assessment   Upper Extremity Assessment Upper Extremity Assessment: Generalized weakness    Lower Extremity Assessment Lower Extremity Assessment: Generalized weakness    Cervical / Trunk Assessment Cervical / Trunk Assessment: Normal  Communication   Communication: No difficulties  Cognition Arousal/Alertness: Awake/alert Behavior During Therapy: WFL for tasks assessed/performed Overall Cognitive Status: Within Functional Limits for tasks assessed                                 General Comments: not specifically tested      General Comments      Exercises     Assessment/Plan    PT Assessment Patient needs continued PT services  PT Problem List Decreased strength;Decreased activity tolerance;Decreased balance;Decreased mobility;Decreased knowledge of use of DME;Decreased knowledge of precautions;Pain       PT Treatment Interventions  PT Goals (Current goals can be found in the Care Plan section)  Acute Rehab PT Goals Patient Stated Goal: none stated PT Goal Formulation: With patient Time For Goal Achievement: 11/21/17 Potential to Achieve Goals: Good    Frequency Min 2X/week   Barriers to discharge        Co-evaluation               AM-PAC PT "6 Clicks" Daily Activity  Outcome Measure Difficulty turning over in bed (including adjusting bedclothes, sheets and blankets)?: A Lot Difficulty moving from lying on back to sitting on the side of the bed? : A Lot Difficulty sitting down on and standing up from a chair with arms (e.g., wheelchair, bedside  commode, etc,.)?: A Lot Help needed moving to and from a bed to chair (including a wheelchair)?: A Lot Help needed walking in hospital room?: A Lot Help needed climbing 3-5 steps with a railing? : A Lot 6 Click Score: 12    End of Session Equipment Utilized During Treatment: Gait belt Activity Tolerance: Patient tolerated treatment well Patient left: in chair;with call bell/phone within reach;with chair alarm set   PT Visit Diagnosis: Muscle weakness (generalized) (M62.81);Difficulty in walking, not elsewhere classified (R26.2)    Time: 1610-9604 PT Time Calculation (min) (ACUTE ONLY): 20 min   Charges:   PT Evaluation $PT Eval Low Complexity: 1 Low     PT G CodesTresa Endo PT [phone]   Claretha Cooper 11/07/2017, 12:58 PM

## 2017-11-07 NOTE — Progress Notes (Signed)
Patient discharged to facility via [place]. Report called to Sydnee Cabal contacted at time of transfer.

## 2017-11-07 NOTE — Progress Notes (Addendum)
Ship broker received. D/C summary sent.  Nurse given the number to call report.  [facility] arranged for transport.  Kathrin Greathouse, Latanya Presser, MSW Clinical Social Worker  [phone] 11/07/2017  2:22 PM

## 2017-11-07 NOTE — Discharge Summary (Signed)
Physician Discharge Summary  Amanda Roman GUR:[id number] DOB: 26-Mar-1941 DOA: 11/02/2017  PCP: Oval Linsey, MD  Admit date: 11/02/2017 Discharge date: 11/07/2017  Time spent: 45 minutes  Recommendations for Outpatient Follow-up:  Patient will be discharged to [facility].  Continue physical, occupational therapy.  Patient will need to follow up with primary care provider within one week of discharge.  Follow up with Dr. Brantley Stage, general surgeon, on 11/12/2017. Patient should continue medications as prescribed.  Patient should follow a Dysphagia 3 diet.   Discharge Diagnoses:  Severe sepsis secondary to Citrobacter koseri UTI and bacteremia Acute metabolic encephalopathy/delirium Incarcerated incisional hernia with small bowel obstruction Essential hypertension Dysphagia Dementia  Discharge Condition: Stable  Diet recommendation: dysphagia 3  Filed Weights   11/02/17 1135 11/02/17 2200  Weight: 74.8 kg (165 lb) 70.7 kg (155 lb 13.8 oz)    History of present illness:  on 11/02/2017 by Dr. Leandrew Koyanagi Amanda Roman a 77 y.o.femalewith medical history significant ofdementia, hypertension, recent ventral hernia repair. Much of the history is obtained by sister at bedside as patient is somnolent and unable to provide history. The sister reports that within one day she noticed a difference in her sister's behavior.The patient was unable to recognize her and was less active than usual.  Hospital Course:  Severe sepsis secondary to Citrobacter koseri UTI and bacteremia -On admission, patient was tachycardic with tachypnea and altered mental status -Currently vital signs are stable -UA on admission showed greater than 50 WBC, rare bacteria, large leukocytes -Blood cultures 11/02/17 detected Enterobacteriaceae species- Citrobacter koseri (pansensitive) -repeat blood cultures on 11/04/17 show no growth to date -Urine culture 70k citrobacter koseri  (pansensitive) -Patient initially placed on vancomycin and cefepime; vancomycin was discontinued and transitioned to ceftriaxone -will discharge with ceftin (will continue for 10 days of total therapy) -patient with PCN allergy however, able to tolerate cefepime and ceftriaxone during hospitalization   Acute metabolic encephalopathy/delirium -Secondary to the above -Unknown patient's baseline- discussed with sister, patient has been having "memory problems for months" -Currently awake and alert oriented x3 -Suspect delirium superimposed on mild dementia -No neurological deficits  Incarcerated incisional hernia with small bowel obstruction -Patient with recent repair on 10/20/2017 -CT abdomen pelvis shows no acute complications -General surgery consulted and appreciated, staples and JP drain removed, outpatient follow-up with Dr. Brantley Stage on 11/12/2017  Essential hypertension -Continue amlodipine, Coreg, clonidine, Lasix  Dysphagia -Speech therapy consulted, recommended dysphagia 3 diet with thin liquids  Physical Deconditioning  -PT and OT recommended SNF  Dementia -stable, on no home medications  Procedures: None  Consultations: General surgery  Discharge Exam: Vitals:   11/06/17 2321 11/07/17 0558  BP: (!) 146/87 (!) 171/85  Pulse: 80 79  Resp: 18 18  Temp: 98.1 F (36.7 C) 98.1 F (36.7 C)  SpO2: 99% 99%   Feeling better today. Denies chest pain, shortness of breath, abdominal pain, N/V/D/C.    General: Well developed, well nourished, NAD, appears stated age  77: NCAT, mucous membranes moist.  Neck: Supple  Cardiovascular: S1 S2 auscultated, 2/6 SEM, RRR  Respiratory: Clear to auscultation bilaterally with equal chest rise  Abdomen: Soft, nontender, nondistended, + bowel sounds  Extremities: warm dry without cyanosis clubbing or edema  Neuro: AAOx3, nonfocal  Psych: appropriate mood and affect, pleasant  Discharge Instructions Discharge  Instructions    Discharge instructions   Complete by:  As directed    Patient will be discharged to [facility].  Continue  physical, occupational therapy.  Patient will need to follow up with primary care provider within one week of discharge.  Follow up with Dr. Brantley Stage, general surgeon, on 11/12/2017. Patient should continue medications as prescribed.  Patient should follow a Dysphagia 3 diet.     Allergies as of 11/07/2017      Reactions   Ace Inhibitors Other (See Comments)   angioedema   Penicillins Itching   Has patient had a PCN reaction causing immediate rash, facial/tongue/throat swelling, SOB or lightheadedness with hypotension: Yes Has patient had a PCN reaction causing severe rash involving mucus membranes or skin necrosis: No Has patient had a PCN reaction that required hospitalization No Has patient had a PCN reaction occurring within the last 10 years: No If all of the above answers are "NO", then may proceed with Cephalosporin use.   Acetaminophen-codeine Rash   Codeine Rash      Medication List    STOP taking these medications   capsaicin 0.025 % cream Commonly known as:  ZOSTRIX   silver sulfADIAZINE 1 % cream Commonly known as:  SILVADENE     TAKE these medications   amLODipine 10 MG tablet Commonly known as:  NORVASC Take 1 tablet (10 mg total) by mouth daily.   ARTHRITIS PAIN RELIEF RUB EX Apply 1 application topically 2 (two) times daily.   calcium-vitamin D 500 MG tablet Take 1 tablet by mouth 2 (two) times daily.   carvedilol 25 MG tablet Commonly known as:  COREG Take 1 tablet (25 mg total) by mouth 2 (two) times daily with a meal.   cefUROXime 500 MG tablet Commonly known as:  CEFTIN Take 1 tablet (500 mg total) by mouth 2 (two) times daily for 5 days.   cloNIDine 0.3 MG tablet Commonly known as:  CATAPRES Take 1 tablet (0.3 mg total) by mouth 2 (two) times daily.   furosemide 20 MG tablet Commonly known as:   LASIX Take 1 tablet (20 mg total) by mouth daily.   multivitamin with minerals Tabs tablet Take 2 tablets by mouth daily. Centrum Silver   Potassium Chloride ER 20 MEQ Tbcr Take 40 mEq by mouth daily.      Allergies  Allergen Reactions  . Ace Inhibitors Other (See Comments)    angioedema  . Penicillins Itching    Has patient had a PCN reaction causing immediate rash, facial/tongue/throat swelling, SOB or lightheadedness with hypotension: Yes Has patient had a PCN reaction causing severe rash involving mucus membranes or skin necrosis: No Has patient had a PCN reaction that required hospitalization No Has patient had a PCN reaction occurring within the last 10 years: No If all of the above answers are "NO", then may proceed with Cephalosporin use.  . Acetaminophen-Codeine Rash  . Codeine Rash   Contact information for after-discharge care    [facility] .   Service:  Skilled Nursing Contact information: [address] [place] [phone]               The results of significant diagnostics from this hospitalization (including imaging, microbiology, ancillary and laboratory) are listed below for reference.    Significant Diagnostic Studies: Dg Chest 2 View  Result Date: 11/05/2017 CLINICAL DATA:  Aspiration of food. EXAM: CHEST - 2 VIEW COMPARISON:  11/02/2017 FINDINGS: Stable mild cardiac enlargement. There is no evidence of pulmonary edema, consolidation, pneumothorax, nodule or pleural fluid. IMPRESSION: No active cardiopulmonary disease. Electronically Signed   By:  Aletta Edouard M.D.   On: 11/05/2017 08:31   Dg Chest 2 View  Result Date: 11/02/2017 CLINICAL DATA:  Altered mental status today. EXAM: CHEST - 2 VIEW COMPARISON:  Single view of the chest 10/20/2017. FINDINGS: NG tube has been removed. There is cardiomegaly without edema. No consolidative process, pneumothorax or effusion. No acute bony  abnormality. IMPRESSION: Cardiomegaly without acute disease. Electronically Signed   By: Inge Rise M.D.   On: 11/02/2017 12:37   Ct Head Wo Contrast  Result Date: 11/02/2017 CLINICAL DATA:  Altered level of consciousness. EXAM: CT HEAD WITHOUT CONTRAST TECHNIQUE: Contiguous axial images were obtained from the base of the skull through the vertex without intravenous contrast. COMPARISON:  None. FINDINGS: Brain: No evidence of acute infarction, hemorrhage, hydrocephalus, extra-axial collection or mass lesion/mass effect. Mild cerebral volume loss and periventricular chronic microvascular ischemic change. Vascular: No hyperdense vessel or unexpected calcification. Skull: Normal. Negative for fracture or focal lesion. Sinuses/Orbits: No acute finding. IMPRESSION: Senescent changes without acute finding. Electronically Signed   By: Monte Fantasia M.D.   On: 11/02/2017 13:56   Ct Abdomen Pelvis W Contrast  Result Date: 11/02/2017 CLINICAL DATA:  Patient BIB [facility]- patient lives at [facility]. Patient was left at surgical clinic by nursing staff at [facility] for a scheduled appointment. Patient reported to have hernia repair surgery April 15th approximately- patient has JP drain to left abdomen with dark red output. Staff at [facility] states the patient was crying in pain in their waitingroom, but was not alert and oriented. Due to not getting a report from [facility] staff, who were not present, EMS services were called. Patient obeys commands in triage and responds to her name, but patient is not oriented to time and situation in triage EXAM: CT ABDOMEN AND PELVIS WITH CONTRAST TECHNIQUE: Multidetector CT imaging of the abdomen and pelvis was performed using the standard protocol following bolus administration of intravenous contrast. CONTRAST:  130mL ISOVUE-300 IOPAMIDOL (ISOVUE-300) INJECTION 61% COMPARISON:  10/19/2017  FINDINGS: Lower chest: Lung base reticular opacities are noted bilaterally consistent with subsegmental atelectasis. Lower lobe bronchial wall thickening. No evidence of pneumonia or pulmonary edema. No pleural effusion. Heart is enlarged. Hepatobiliary: Normal liver. There is material of increased echogenicity in the gallbladder fundus associated with a small calcification. This may reflect an inherent mass. It could reflect focal adenomyosis. Subtle increased attenuation is noted in the dependent gallbladder which may reflect stones or sludge. There is no wall thickening or adjacent inflammation. No bile duct dilation. Pancreas: Unremarkable. No pancreatic ductal dilatation or surrounding inflammatory changes. Spleen: Normal in size without focal abnormality. Adrenals/Urinary Tract: No adrenal masses. 6 mm low-density mass in the upper pole the right kidney. 7 mm low-density mass in the posterior midpole of the left kidney. Both of these are stable consistent with cysts. No other renal masses, no stones and no hydronephrosis. Ureters are normal in course and in caliber. Bladder is unremarkable. Stomach/Bowel: Stomach is unremarkable. Small bowel is normal caliber. No wall thickening. There is inflammatory type stranding in the anterior peritoneal fat and inferior omentum in the left mid to lower abdomen which abuts a loop of small bowel. No colonic wall thickening or inflammation. Rectum is mild-to-moderately distended with stool. There is mild increased stool burden in the right colon. Normal appendix is visualized. Vascular/Lymphatic: Aortic atherosclerosis. No enlarged abdominal or pelvic lymph nodes. Reproductive: 2 cm left anterior upper uterine segment subserosal fibroid. No  adnexal/ovarian masses. Other: Surgical drain lies within the deep subcutaneous fat of the left lower quadrant. There is inflammatory type stranding surrounding the drain, but no defined collection to suggest an abscess. Small residual  defect is seen in the fascia of the left lower quadrant through which a small amount of fat herniates. No bowel enters this. No ascites. Musculoskeletal: No fracture or acute finding. No osteoblastic or osteolytic lesions. IMPRESSION: 1. There are postsurgical changes in the left lower abdomen from the recent hernia repair. Inflammatory type stranding is seen in the fat of the lower anterior abdominal wall and within the underlying anterior mesentery and omentum. However, there is no defined collection to suggest an abscess. 2. Small residual fascial defect is seen in the left lower quadrant, just to the left of midline. Only a small amount of fat herniates through this. No bowel. 3. Mild increased stool burden in the colon. No bowel obstruction or inflammation. 4. Mild aortic atherosclerosis. Electronically Signed   By: Lajean Manes M.D.   On: 11/02/2017 14:10   Ct Abdomen Pelvis W Contrast  Result Date: 10/19/2017 CLINICAL DATA:  Nausea and vomiting. EXAM: CT ABDOMEN AND PELVIS WITH CONTRAST TECHNIQUE: Multidetector CT imaging of the abdomen and pelvis was performed using the standard protocol following bolus administration of intravenous contrast. CONTRAST:  145mL ISOVUE-300 IOPAMIDOL (ISOVUE-300) INJECTION 61% COMPARISON:  None. FINDINGS: Lower chest: Multi chamber cardiomegaly. Mitral annulus calcifications. No pleural fluid or basilar consolidation. Hepatobiliary: No focal hepatic lesion. No gallbladder wall thickening or biliary dilatation. Phrygian cap versus distal gallstones in the gallbladder. Pancreas: No ductal dilatation or inflammation. Spleen: Normal in size without focal abnormality. Adrenals/Urinary Tract: Normal adrenal glands. No hydronephrosis or perinephric edema. Small cortical cysts in both kidneys. Homogeneous renal enhancement with symmetric excretion on delayed phase imaging. Urinary bladder is distended to the umbilicus without wall thickening. Bladder volume = 810 cm^3.  Stomach/Bowel: Small hiatal hernia. Stomach is nondistended. Lack of enteric contrast limits detailed bowel assessment. Complex lower ventral abdominal wall hernia, slightly dumbbell-shaped. There are fluid-filled prominent small bowel loops in the more caudal hernia sac, however no evidence of obstruction or bowel wall thickening. Mild stranding in the hernia sac of unknown acuity. More cranial hernia sac contains small and large bowel without wall thickening or obstruction. Diffuse colonic tortuosity. Cecum located in the right mid abdomen. Normal appendix. Vascular/Lymphatic: Aorta bi-iliac atherosclerosis. No aneurysm. No enlarged abdominal or pelvic lymph nodes. Reproductive: Scattered myometrial calcifications are likely fibroids. There is fluid in the vagina versus vaginal cyst image 64 series 3. the ovaries are not well-defined. No gross adnexal mass. Other: No free air or intra-abdominal abscess. Musculoskeletal: Multilevel degenerative change in the spine. There are no acute or suspicious osseous abnormalities. IMPRESSION: 1. Complex lower ventral abdominal wall hernia, slightly dumbbell-shaped. Prominent fluid-filled small bowel in the more caudal hernia sac with stranding of the adjacent fat, may reflect focal ileus, however no evidence of obstruction or bowel wall thickening. The more cranial hernia sac contains normal small and large bowel. 2. Urinary bladder distention to the level in bili callus, recommend correlation for urinary retention. 3. Gallstone versus Phrygian cap of the gallbladder, no pericholecystic inflammation. 4. Fluid in the vagina of uncertain etiology and significance. Uterine calcifications likely fibroid. 5. Small hiatal hernia. 6. Cardiomegaly.  Aortic Atherosclerosis (ICD10-I70.0). Electronically Signed   By: Jeb Levering M.D.   On: 10/19/2017 22:36   Dg Chest Port 1 View  Result Date: 10/20/2017 CLINICAL DATA:  Central line placement.  EXAM: PORTABLE CHEST 1 VIEW  COMPARISON:  None. FINDINGS: RIGHT IJ central line appears adequately positioned with tip at the level of the mid/lower SVC. No pneumothorax seen. Lungs are clear. Enteric tube passes below the diaphragm. Cardiomegaly. IMPRESSION: 1. RIGHT IJ central line appears adequately positioned with tip at the level of the mid/lower SVC. No pneumothorax seen. 2. Enteric tube appears adequately positioned below the diaphragm. 3. Cardiomegaly. Electronically Signed   By: Franki Cabot M.D.   On: 10/20/2017 14:56   Dg Abd Portable 1 View  Result Date: 10/20/2017 CLINICAL DATA:  NG tube placement EXAM: PORTABLE ABDOMEN - 1 VIEW COMPARISON:  None. FINDINGS: The side port of a gastric tube is seen at the GE junction and further advancement of the tube by at least 3-5 cm is recommended. The included heart size is enlarged. No effusion pulmonary consolidation at the lung bases. There is no free air. A moderate amount of stool is seen in the right colon. No significant small bowel dilatation. No radio-opaque calculi or other significant radiographic abnormality are seen. IMPRESSION: The side port of a gastric tube is seen at the GE junction and further advancement of the tube is recommended by at least 3 to 5 cm. Electronically Signed   By: Ashley Royalty M.D.   On: 10/20/2017 01:02    Microbiology: Recent Results (from the past 240 hour(s))  Culture, blood (Routine x 2)     Status: None   Collection Time: 11/02/17 11:42 AM  Result Value Ref Range Status   Specimen Description   Final    BLOOD LEFT ANTECUBITAL Performed at [facility] [address] [place], [place] [address]    Special Requests   Final    BOTTLES DRAWN AEROBIC AND ANAEROBIC Blood Culture results may not be optimal due to an inadequate volume of blood received in culture bottles Performed at [facility] [address], [place], [place] [address]    Culture   Final    NO GROWTH 5 DAYS Performed at [facility], [place] [address], [place], [place] [address]    Report Status 11/07/2017 FINAL  Final  Culture, blood (Routine x 2)     Status: Abnormal   Collection Time: 11/02/17 12:03 PM  Result Value Ref Range Status   Specimen Description   Final    BLOOD RIGHT ANTECUBITAL Performed at [facility] [address] [place], [place] [address]    Special Requests   Final    BOTTLES DRAWN AEROBIC AND ANAEROBIC Blood Culture adequate volume Performed at [facility] [address], [place], [place] [address]    Culture  Setup Time   Final    GRAM NEGATIVE RODS AEROBIC BOTTLE ONLY CRITICAL RESULT CALLED TO, READ BACK BY AND VERIFIED WITH: D ZEIGLER,PHARMD AT 1749 11/03/17 BY L BENFIELD Performed at [facility], [place] [address] [place], [place] [address]    Culture CITROBACTER KOSERI (A)  Final   Report Status 11/05/2017 FINAL  Final   Organism ID, Bacteria CITROBACTER KOSERI  Final      Susceptibility   Citrobacter koseri - MIC*    CEFAZOLIN <=4 SENSITIVE Sensitive     CEFEPIME <=1 SENSITIVE Sensitive     CEFTAZIDIME <=1 SENSITIVE Sensitive     CEFTRIAXONE <=1 SENSITIVE Sensitive     CIPROFLOXACIN <=0.25 SENSITIVE Sensitive     GENTAMICIN <=1 SENSITIVE Sensitive     IMIPENEM <=0.25 SENSITIVE Sensitive  TRIMETH/SULFA <=20 SENSITIVE Sensitive     PIP/TAZO <=4 SENSITIVE Sensitive     * CITROBACTER KOSERI  Blood Culture ID Panel (Reflexed)     Status: Abnormal   Collection Time: 11/02/17 12:03 PM  Result Value Ref Range Status   Enterococcus species NOT DETECTED NOT DETECTED Final   Listeria monocytogenes NOT DETECTED NOT DETECTED Final   Staphylococcus species NOT DETECTED NOT DETECTED Final   Staphylococcus aureus NOT DETECTED NOT DETECTED Final   Streptococcus species NOT DETECTED NOT DETECTED Final   Streptococcus agalactiae NOT DETECTED NOT DETECTED Final   Streptococcus pneumoniae NOT DETECTED NOT DETECTED Final   Streptococcus  pyogenes NOT DETECTED NOT DETECTED Final   Acinetobacter baumannii NOT DETECTED NOT DETECTED Final   Enterobacteriaceae species DETECTED (A) NOT DETECTED Final    Comment: Enterobacteriaceae represent a large family of gram negative bacteria, not a single organism. Refer to culture for further identification. CRITICAL RESULT CALLED TO, READ BACK BY AND VERIFIED WITH: D ZEIGLER,PHARMD AT 6761 11/03/17 BY L BENFIELD    Enterobacter cloacae complex NOT DETECTED NOT DETECTED Final   Escherichia coli NOT DETECTED NOT DETECTED Final   Klebsiella oxytoca NOT DETECTED NOT DETECTED Final   Klebsiella pneumoniae NOT DETECTED NOT DETECTED Final   Proteus species NOT DETECTED NOT DETECTED Final   Serratia marcescens NOT DETECTED NOT DETECTED Final   Carbapenem resistance NOT DETECTED NOT DETECTED Final   Haemophilus influenzae NOT DETECTED NOT DETECTED Final   Neisseria meningitidis NOT DETECTED NOT DETECTED Final   Pseudomonas aeruginosa NOT DETECTED NOT DETECTED Final   Candida albicans NOT DETECTED NOT DETECTED Final   Candida glabrata NOT DETECTED NOT DETECTED Final   Candida krusei NOT DETECTED NOT DETECTED Final   Candida parapsilosis NOT DETECTED NOT DETECTED Final   Candida tropicalis NOT DETECTED NOT DETECTED Final    Comment: Performed at [facility], [place] [address] [place], [place] [address]  Urine culture     Status: Abnormal   Collection Time: 11/02/17  1:18 PM  Result Value Ref Range Status   Specimen Description   Final    URINE, RANDOM Performed at [facility] [address], [place], [place] [address]    Special Requests   Final    NONE Performed at [facility], [place] [address], [place], [place] [address]    Culture 70,000 COLONIES/mL CITROBACTER KOSERI (A)  Final   Report Status 11/06/2017 FINAL  Final   Organism ID, Bacteria CITROBACTER KOSERI (A)  Final      Susceptibility   Citrobacter koseri - MIC*    CEFAZOLIN <=4  SENSITIVE Sensitive     CEFTRIAXONE <=1 SENSITIVE Sensitive     CIPROFLOXACIN <=0.25 SENSITIVE Sensitive     GENTAMICIN <=1 SENSITIVE Sensitive     IMIPENEM <=0.25 SENSITIVE Sensitive     NITROFURANTOIN 32 SENSITIVE Sensitive     TRIMETH/SULFA <=20 SENSITIVE Sensitive     PIP/TAZO <=4 SENSITIVE Sensitive     * 70,000 COLONIES/mL CITROBACTER KOSERI  Culture, Urine     Status: None   Collection Time: 11/04/17  3:17 PM  Result Value Ref Range Status   Specimen Description   Final    URINE, CATHETERIZED Performed at [facility], [place] [address], [place], [place] [address]    Special Requests   Final    NONE Performed at [facility] [place] [address], [place], [place] [address]    Culture   Final  NO GROWTH Performed at [facility], [place] [address], [place], [place] [address]    Report Status 11/06/2017 FINAL  Final  Culture, blood (routine x 2)     Status: None (Preliminary result)   Collection Time: 11/04/17  3:54 PM  Result Value Ref Range Status   Specimen Description   Final    BLOOD RIGHT HAND Performed at [facility] [address], [place], [place] [address]    Special Requests   Final    BOTTLES DRAWN AEROBIC AND ANAEROBIC Blood Culture results may not be optimal due to an excessive volume of blood received in culture bottles Performed at [facility] [address] [place], [place] [address]    Culture   Final    NO GROWTH 3 DAYS Performed at [facility], [place] [address] [place], [place] [address]    Report Status PENDING  Incomplete  Culture, blood (routine x 2)     Status: None (Preliminary result)   Collection Time: 11/04/17  3:57 PM  Result Value Ref Range Status   Specimen Description   Final    LEFT ANTECUBITAL Performed at [facility] [address] [place], [place] [address]    Special Requests   Final    BOTTLES DRAWN AEROBIC AND ANAEROBIC  Blood Culture adequate volume Performed at [facility] [address], [place], [place] [address]    Culture   Final    NO GROWTH 3 DAYS Performed at [facility], [place] [address], [place], [place] [address]    Report Status PENDING  Incomplete     Labs: Basic Metabolic Panel: Recent Labs  Lab 11/02/17 1202 11/02/17 1206 11/03/17 0444 11/04/17 0510 11/05/17 0520 11/06/17 0635 11/07/17 0604  NA 140 142 143 142  --  140  --   K 3.6 3.6 3.5 4.0  --  3.5  --   CL 101 101 105 105  --  101  --   CO2 28  --  26 27  --  27  --   GLUCOSE 122* 124* 106* 112*  --  110*  --   BUN 10 9 10 11   --  7  --   CREATININE 0.63 0.70 0.58 0.56 0.49 0.45 0.45  CALCIUM 9.5  --  9.5 9.9  --  9.5  --   MG 1.8  --   --   --   --   --   --    Liver Function Tests: Recent Labs  Lab 11/02/17 1202 11/04/17 0510  AST 35 29  ALT 48 39  ALKPHOS 69 58  BILITOT 0.7 0.6  PROT 8.1 7.1  ALBUMIN 3.1* 2.7*   No results for input(s): LIPASE, AMYLASE in the last 168 hours. No results for input(s): AMMONIA in the last 168 hours. CBC: Recent Labs  Lab 11/02/17 1202 11/02/17 1206 11/03/17 0444 11/06/17 0635  WBC 8.9  --  7.3 6.4  NEUTROABS 6.4  --   --   --   HGB 12.3 12.6 11.3* 11.2*  HCT 35.8* 37.0 33.6* 33.6*  MCV 92.5  --  93.1 92.3  PLT 449*  --  414* 431*   Cardiac Enzymes: No results for input(s): CKTOTAL, CKMB, CKMBINDEX, TROPONINI in the last 168 hours. BNP: BNP (last 3 results) No results for input(s): BNP in the last 8760 hours.  ProBNP (last 3 results) No results for input(s): PROBNP in the last 8760  hours.  CBG: Recent Labs  Lab 11/04/17 0627  GLUCAP 107*       Signed:  Kemuel Buchmann  [facility] 11/07/2017, 1:19 PM

## 2017-11-07 NOTE — Discharge Instructions (Signed)
Bacteremia °Bacteremia is the presence of bacteria in the blood. When bacteria enter the bloodstream, they can cause a life-threatening reaction called sepsis, which is a medical emergency. Bacteremia can spread to other parts of the body, including the heart, joints, and brain. °What are the causes? °This condition is caused by bacteria that get into the blood. Bacteria can enter the blood: °· From a skin infection or a cut on your skin. °· During an episode of pneumonia. °· From an infection in your stomach or intestine (gastrointestinal infection). °· From an infection in your bladder or urinary system (urinary tract infection). °· During a dental or medical procedure. °· After you brush your teeth so hard that your gums bleed. °· When a bacterial infection in another part of the body spreads to the blood. °· Through a dirty needle. ° °What increases the risk? °This condition is more likely to develop in: °· Children. °· Elderly adults. °· People who have a long-lasting (chronic) disease or medical condition. °· People who have an artificial joint or heart valve. °· People who have heart valve disease. °· People who have a tube, such as a catheter or IV tube, that has been inserted for a medical treatment. °· People who have a weak body defense system (immune system). °· People who use IV drugs. ° °What are the signs or symptoms? °Symptoms of this condition include: °· Fever. °· Chills. °· A racing heart. °· Shortness of breath. °· Dizziness. °· Weakness. °· Confusion. °· Nausea or vomiting. °· Diarrhea. ° °In some cases, there are no symptoms. Bacteremia that has spread to the other parts of the body may cause symptoms in those areas. °How is this diagnosed? °This condition may be diagnosed with a physical exam and tests, such as: °· A complete blood count (CBC). This test looks for signs of infection. °· Blood cultures. These look for bacteria in your blood. °· Tests of any tubes that you may have inserted into  your body, such as an IV tube or urinary catheter. These tests look for a source of infection. °· Urine tests including urine cultures. These look for bacteria in the urine that could be a source of infection. °· Imaging tests, such as an X-ray, CT scan, MRI, or heart ultrasound. These look for a source of infection in other parts of the body, such as the lungs, heart valves, or joints. ° °How is this treated? °This condition may be treated with: °· Antibiotic medicines given through an IV infusion. Depending on the source of infection, antibiotics may be needed for several weeks. At first, an antibiotic may be given to kill most types of blood bacteria. If your test results show that a certain kind of bacteria is causing problems, the antibiotic may be changed to kill only the bacteria that are causing problems. °· Antibiotics taken by mouth. °· IV fluids to support the body as you fight the infection. °· Removing any catheter or device that could be a source of infection. °· Blood pressure and breathing support, if you have sepsis. °· Surgery to control the source or spread of infection, such as: °? Removing an infected implanted device. °? Removing infected tissue or an abscess. ° °This condition is usually treated at a hospital. If you are treated at home, you may need to come back for medicines, blood tests, and evaluation. This is important. °Follow these instructions at home: °· Take over-the-counter and prescription medicines only as told by your health care provider. °·   If you were prescribed an antibiotic, take it as told by your health care provider. Do not stop taking the antibiotic even if you start to feel better.  Rest until your condition is under control.  Drink enough fluid to keep your urine clear or pale yellow.  Do not smoke. If you need help quitting, ask your health care provider.  Keep all follow-up visits as told by your health care provider. This is important. How is this  prevented?  Get the vaccinations that your health care provider recommends.  Clean and cover any scrapes or cuts.  Take good care of your skin. This includes regular bathing and moisturizing.  Wash your hands often.  Practice good oral hygiene. Brush your teeth two times a day and floss regularly. Get help right away if:  You have pain.  You have a fever.  You have trouble breathing.  Your skin becomes blotchy, pale, or clammy.  You develop confusion, dizziness, or weakness.  You develop diarrhea.  You develop any new symptoms after treatment. Summary  Bacteremia is the presence of bacteria in the blood. When bacteria enter the bloodstream, they can cause a life- threatening reaction called sepsis.  Children and elderly adults are at increased risk of bacteremia. Other risk factors include having a long-lasting (chronic) disease or a weak immune system, having an artificial joint or heart valve, having heart valve disease, having tubes that were inserted in the body for medical treatment, or using IV drugs.  Some symptoms of bacteremia include fever, chills, shortness of breath, confusion, nausea or vomiting, and diarrhea.  Tests may be done to diagnose a source of infection that led to bacteremia. These tests may include blood tests, urine tests, and imaging tests.  Bacteremia is usually treated with antibiotics, usually in a hospital. This information is not intended to replace advice given to you by your health care provider. Make sure you discuss any questions you have with your health care provider. Document Released: 04/09/2006 Document Revised: 05/23/2016 Document Reviewed: 05/23/2016 Elsevier Interactive Patient Education  2018 [employer].  Urinary Tract Infection, Adult A urinary tract infection (UTI) is an infection of any part of the urinary tract, which includes the kidneys, ureters, bladder, and urethra. These organs make, store, and get rid of urine in the  body. UTI can be a bladder infection (cystitis) or kidney infection (pyelonephritis). What are the causes? This infection may be caused by fungi, viruses, or bacteria. Bacteria are the most common cause of UTIs. This condition can also be caused by repeated incomplete emptying of the bladder during urination. What increases the risk? This condition is more likely to develop if:  You ignore your need to urinate or hold urine for long periods of time.  You do not empty your bladder completely during urination.  You wipe back to front after urinating or having a bowel movement, if you are female.  You are uncircumcised, if you are female.  You are constipated.  You have a urinary catheter that stays in place (indwelling).  You have a weak defense (immune) system.  You have a medical condition that affects your bowels, kidneys, or bladder.  You have diabetes.  You take antibiotic medicines frequently or for long periods of time, and the antibiotics no longer work well against certain types of infections (antibiotic resistance).  You take medicines that irritate your urinary tract.  You are exposed to chemicals that irritate your urinary tract.  You are female.  What are the  signs or symptoms? Symptoms of this condition include:  Fever.  Frequent urination or passing small amounts of urine frequently.  Needing to urinate urgently.  Pain or burning with urination.  Urine that smells bad or unusual.  Cloudy urine.  Pain in the lower abdomen or back.  Trouble urinating.  Blood in the urine.  Vomiting or being less hungry than normal.  Diarrhea or abdominal pain.  Vaginal discharge, if you are female.  How is this diagnosed? This condition is diagnosed with a medical history and physical exam. You will also need to provide a urine sample to test your urine. Other tests may be done, including:  Blood tests.  Sexually transmitted disease (STD) testing.  If you  have had more than one UTI, a cystoscopy or imaging studies may be done to determine the cause of the infections. How is this treated? Treatment for this condition often includes a combination of two or more of the following:  Antibiotic medicine.  Other medicines to treat less common causes of UTI.  Over-the-counter medicines to treat pain.  Drinking enough water to stay hydrated.  Follow these instructions at home:  Take over-the-counter and prescription medicines only as told by your health care provider.  If you were prescribed an antibiotic, take it as told by your health care provider. Do not stop taking the antibiotic even if you start to feel better.  Avoid alcohol, caffeine, tea, and carbonated beverages. They can irritate your bladder.  Drink enough fluid to keep your urine clear or pale yellow.  Keep all follow-up visits as told by your health care provider. This is important.  Make sure to: ? Empty your bladder often and completely. Do not hold urine for long periods of time. ? Empty your bladder before and after sex. ? Wipe from front to back after a bowel movement if you are female. Use each tissue one time when you wipe. Contact a health care provider if:  You have back pain.  You have a fever.  You feel nauseous or vomit.  Your symptoms do not get better after 3 days.  Your symptoms go away and then return. Get help right away if:  You have severe back pain or lower abdominal pain.  You are vomiting and cannot keep down any medicines or water. This information is not intended to replace advice given to you by your health care provider. Make sure you discuss any questions you have with your health care provider. Document Released: 04/05/2005 Document Revised: 12/08/2015 Document Reviewed: 05/17/2015 Elsevier Interactive Patient Education  [employer].

## 2017-11-08 DIAGNOSIS — M6281 Muscle weakness (generalized): Secondary | ICD-10-CM | POA: Diagnosis not present

## 2017-11-08 DIAGNOSIS — R5383 Other fatigue: Secondary | ICD-10-CM | POA: Diagnosis not present

## 2017-11-08 DIAGNOSIS — L98499 Non-pressure chronic ulcer of skin of other sites with unspecified severity: Secondary | ICD-10-CM | POA: Diagnosis not present

## 2017-11-09 DIAGNOSIS — G9349 Other encephalopathy: Secondary | ICD-10-CM | POA: Diagnosis not present

## 2017-11-09 DIAGNOSIS — I10 Essential (primary) hypertension: Secondary | ICD-10-CM | POA: Diagnosis not present

## 2017-11-09 DIAGNOSIS — N390 Urinary tract infection, site not specified: Secondary | ICD-10-CM | POA: Diagnosis not present

## 2017-11-09 LAB — CULTURE, BLOOD (ROUTINE X 2)
CULTURE: NO GROWTH
Culture: NO GROWTH
Special Requests: ADEQUATE

## 2017-11-13 DIAGNOSIS — R5383 Other fatigue: Secondary | ICD-10-CM | POA: Diagnosis not present

## 2017-11-13 DIAGNOSIS — M6281 Muscle weakness (generalized): Secondary | ICD-10-CM | POA: Diagnosis not present

## 2017-11-22 DIAGNOSIS — L98499 Non-pressure chronic ulcer of skin of other sites with unspecified severity: Secondary | ICD-10-CM | POA: Diagnosis not present

## 2017-11-29 DIAGNOSIS — R262 Difficulty in walking, not elsewhere classified: Secondary | ICD-10-CM | POA: Diagnosis not present

## 2017-11-29 DIAGNOSIS — M6281 Muscle weakness (generalized): Secondary | ICD-10-CM | POA: Diagnosis not present

## 2017-11-29 DIAGNOSIS — I10 Essential (primary) hypertension: Secondary | ICD-10-CM | POA: Diagnosis not present

## 2017-11-29 DIAGNOSIS — R5382 Chronic fatigue, unspecified: Secondary | ICD-10-CM | POA: Diagnosis not present

## 2017-11-29 DIAGNOSIS — I872 Venous insufficiency (chronic) (peripheral): Secondary | ICD-10-CM | POA: Diagnosis not present

## 2017-12-04 ENCOUNTER — Other Ambulatory Visit: Payer: Self-pay | Admitting: *Deleted

## 2017-12-04 ENCOUNTER — Telehealth: Payer: Self-pay | Admitting: *Deleted

## 2017-12-04 DIAGNOSIS — I10 Essential (primary) hypertension: Secondary | ICD-10-CM

## 2017-12-04 MED ORDER — CARVEDILOL 25 MG PO TABS: 25.0000 mg | ORAL_TABLET | Freq: Two times a day (BID) | ORAL | 3 refills | Status: DC

## 2017-12-04 NOTE — Telephone Encounter (Signed)
Received call from [place] with [facility] (ph [phone])-states pt was d/c'd home from [facility] (rehab) on 4/24. [facility] received order for home PT, OT, and Speech eval at discharge and wanted to inform pt's pcp that they will go out to see patient tomorrow.  Once evaluated, if pt needs/qualifies for services-then [facility] will contact [facility] for authorization as pcp will be responsible for signing orders as pt is no longer in rehab.  Will send info to pcp for review. No action needed at this time, but pcp will have to authorize services once pt is evaluated. Regenia Skeeter, Darlene Cassady5/28/20192:49 PM

## 2017-12-06 DIAGNOSIS — R1310 Dysphagia, unspecified: Secondary | ICD-10-CM | POA: Diagnosis not present

## 2017-12-06 DIAGNOSIS — Z87440 Personal history of urinary (tract) infections: Secondary | ICD-10-CM | POA: Diagnosis not present

## 2017-12-06 DIAGNOSIS — Z48815 Encounter for surgical aftercare following surgery on the digestive system: Secondary | ICD-10-CM | POA: Diagnosis not present

## 2017-12-06 DIAGNOSIS — I872 Venous insufficiency (chronic) (peripheral): Secondary | ICD-10-CM | POA: Diagnosis not present

## 2017-12-06 DIAGNOSIS — Z9181 History of falling: Secondary | ICD-10-CM | POA: Diagnosis not present

## 2017-12-06 DIAGNOSIS — I119 Hypertensive heart disease without heart failure: Secondary | ICD-10-CM | POA: Diagnosis not present

## 2017-12-07 ENCOUNTER — Telehealth: Payer: Self-pay | Admitting: Internal Medicine

## 2017-12-07 NOTE — Telephone Encounter (Signed)
I approve the request as desired.  Thank you.

## 2017-12-07 NOTE — Telephone Encounter (Signed)
Rec'd call from [place] @ [facility]). Requesting VO for HH,SN, PT, OT and a Education officer, museum.  Notified [facility] that the patient has an upcoming Face-to face visit with you on 12/13/2017 and our office will be in contact with them if these Orders are approved before you see the patient.  Please Advise.

## 2017-12-10 NOTE — Telephone Encounter (Signed)
Care will begin Friday 6/7, will call Amanda Roman thurs 6/6 pm and give him update from thurs visit w/ dr Eppie Gibson

## 2017-12-10 NOTE — Telephone Encounter (Signed)
Thank you.  Bonnita Nasuti will Call [facility]) to Give the VO for Start of Care as soon as possible.

## 2017-12-11 DIAGNOSIS — R1310 Dysphagia, unspecified: Secondary | ICD-10-CM | POA: Diagnosis not present

## 2017-12-11 DIAGNOSIS — Z48815 Encounter for surgical aftercare following surgery on the digestive system: Secondary | ICD-10-CM | POA: Diagnosis not present

## 2017-12-11 DIAGNOSIS — Z87440 Personal history of urinary (tract) infections: Secondary | ICD-10-CM | POA: Diagnosis not present

## 2017-12-11 DIAGNOSIS — I119 Hypertensive heart disease without heart failure: Secondary | ICD-10-CM | POA: Diagnosis not present

## 2017-12-11 DIAGNOSIS — Z9181 History of falling: Secondary | ICD-10-CM | POA: Diagnosis not present

## 2017-12-11 DIAGNOSIS — I872 Venous insufficiency (chronic) (peripheral): Secondary | ICD-10-CM | POA: Diagnosis not present

## 2017-12-13 ENCOUNTER — Ambulatory Visit (INDEPENDENT_AMBULATORY_CARE_PROVIDER_SITE_OTHER): Payer: Medicare Other | Admitting: Internal Medicine

## 2017-12-13 ENCOUNTER — Encounter: Payer: Self-pay | Admitting: Internal Medicine

## 2017-12-13 VITALS — BP 124/54 | HR 65 | Temp 98.0°F | Wt 152.4 lb

## 2017-12-13 DIAGNOSIS — M159 Polyosteoarthritis, unspecified: Secondary | ICD-10-CM

## 2017-12-13 DIAGNOSIS — E663 Overweight: Secondary | ICD-10-CM

## 2017-12-13 DIAGNOSIS — I10 Essential (primary) hypertension: Secondary | ICD-10-CM | POA: Diagnosis not present

## 2017-12-13 DIAGNOSIS — R32 Unspecified urinary incontinence: Secondary | ICD-10-CM | POA: Diagnosis not present

## 2017-12-13 DIAGNOSIS — R451 Restlessness and agitation: Secondary | ICD-10-CM

## 2017-12-13 DIAGNOSIS — M8580 Other specified disorders of bone density and structure, unspecified site: Secondary | ICD-10-CM | POA: Diagnosis not present

## 2017-12-13 DIAGNOSIS — R4189 Other symptoms and signs involving cognitive functions and awareness: Secondary | ICD-10-CM

## 2017-12-13 DIAGNOSIS — Z6829 Body mass index (BMI) 29.0-29.9, adult: Secondary | ICD-10-CM

## 2017-12-13 DIAGNOSIS — M150 Primary generalized (osteo)arthritis: Secondary | ICD-10-CM

## 2017-12-13 DIAGNOSIS — F419 Anxiety disorder, unspecified: Secondary | ICD-10-CM | POA: Diagnosis not present

## 2017-12-13 DIAGNOSIS — R1310 Dysphagia, unspecified: Secondary | ICD-10-CM | POA: Diagnosis not present

## 2017-12-13 DIAGNOSIS — Z9181 History of falling: Secondary | ICD-10-CM | POA: Diagnosis not present

## 2017-12-13 DIAGNOSIS — F515 Nightmare disorder: Secondary | ICD-10-CM

## 2017-12-13 DIAGNOSIS — I872 Venous insufficiency (chronic) (peripheral): Secondary | ICD-10-CM | POA: Diagnosis not present

## 2017-12-13 DIAGNOSIS — F4310 Post-traumatic stress disorder, unspecified: Secondary | ICD-10-CM | POA: Insufficient documentation

## 2017-12-13 DIAGNOSIS — Z48815 Encounter for surgical aftercare following surgery on the digestive system: Secondary | ICD-10-CM | POA: Diagnosis not present

## 2017-12-13 DIAGNOSIS — Z87828 Personal history of other (healed) physical injury and trauma: Secondary | ICD-10-CM

## 2017-12-13 DIAGNOSIS — F323 Major depressive disorder, single episode, severe with psychotic features: Secondary | ICD-10-CM | POA: Diagnosis not present

## 2017-12-13 DIAGNOSIS — I119 Hypertensive heart disease without heart failure: Secondary | ICD-10-CM | POA: Diagnosis not present

## 2017-12-13 DIAGNOSIS — Z9889 Other specified postprocedural states: Secondary | ICD-10-CM

## 2017-12-13 DIAGNOSIS — Z87440 Personal history of urinary (tract) infections: Secondary | ICD-10-CM | POA: Diagnosis not present

## 2017-12-13 DIAGNOSIS — Z79899 Other long term (current) drug therapy: Secondary | ICD-10-CM

## 2017-12-13 MED ORDER — FUROSEMIDE 20 MG PO TABS: 20.0000 mg | ORAL_TABLET | Freq: Every day | ORAL | 3 refills | Status: DC

## 2017-12-13 MED ORDER — POTASSIUM CHLORIDE CRYS ER 20 MEQ PO TBCR: 20.0000 meq | EXTENDED_RELEASE_TABLET | Freq: Every morning | ORAL | 3 refills | Status: DC

## 2017-12-13 MED ORDER — ASPIRIN EC 81 MG PO TBEC: 81.0000 mg | DELAYED_RELEASE_TABLET | Freq: Every day | ORAL | 3 refills | Status: DC

## 2017-12-13 MED ORDER — SERTRALINE HCL 50 MG PO TABS: 50.0000 mg | ORAL_TABLET | Freq: Every day | ORAL | 3 refills | Status: DC

## 2017-12-13 NOTE — Patient Instructions (Signed)
It was nice to see you today.  I am sorry to hear about all of the troubles you have been having lately and the bad memories that are coming back.  1) Please stop the methylphenidate.  You no longer need this medication.  2) Decrease the furosemide to 20 mg every morning.  This means you have to cut the tablet in half until you run out.  When you get the next bottle it will have 20 mg tablets in it so you can take a full tablet then.  3) Decrease the potassium chloride to 20 mEq by mouth daily.  This is now only 1 tablet a day.  4) Start sertraline (Zoloft) at 50 mg daily.  This medication will take at least 3 weeks to start working, so keep taking it even if you do not notice an improvement in your mood right away.  Your new doctor can adjust this upwards if she thinks this is the right thing to do.  5) Keep taking the other medications as you are.  6) We checked some blood work today to see if it might explain some of the confusion.  7) I think moving to [facility] is a fantastic idea.  With the move you will have a new Primary Doctor.  I have really enjoyed taking care of you all of these years and will miss you very much.  I am sure [facility] will do you right!

## 2017-12-13 NOTE — Assessment & Plan Note (Signed)
Assessment  She continues to use the Capsacian cream with relief of her knee arthritic pain.  Plan  We will continue the Capsacian cream as needed for arthritic pain in the knees and reassess the efficacy of this therapy at the follow-up visit with her new primary care provider.

## 2017-12-13 NOTE — Assessment & Plan Note (Addendum)
She is otherwise up-to-date on her health care maintenance.

## 2017-12-13 NOTE — Progress Notes (Signed)
   Subjective:    Patient ID: Amanda Roman, female    DOB: 19-Aug-1940, 77 y.o.   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 Please see the A&P for the status of the pt's chronic medical problems.  She has recovered well from a surgical standpoint with a well healed wound and no more abdominal pain, nausea, or vomiting.  She is eating well without any change in her appetite.  Review of Systems  Constitutional: Positive for activity change. Negative for appetite change and unexpected weight change.       Less active than usual.  Cardiovascular: Positive for leg swelling.       Unchanged from baseline chronic venous insufficiency  Gastrointestinal: Negative for abdominal distention, abdominal pain, nausea and vomiting.  Musculoskeletal: Positive for arthralgias. Negative for joint swelling and myalgias.  Skin: Positive for wound.  Psychiatric/Behavioral: Positive for agitation, behavioral problems, confusion, decreased concentration, dysphoric mood, hallucinations and sleep disturbance. The patient is nervous/anxious.        Has had anxiety, nightmares, depressed mood/anhedonia, non-restful sleep, hypervigilence, and occasional hallucinations/paronia.      Objective:   Physical Exam  Constitutional: She is oriented to person, place, and time. She appears well-developed and well-nourished. No distress.  HENT:  Head: Normocephalic.  Eyes: Conjunctivae are normal. Right eye exhibits no discharge. Left eye exhibits no discharge. No scleral icterus.  Cardiovascular: Normal rate, regular rhythm and normal heart sounds. Exam reveals no gallop and no friction rub.  No murmur heard. Pulmonary/Chest: Effort normal and breath sounds  normal. No stridor. No respiratory distress. She has no wheezes.  Abdominal: Soft. Bowel sounds are normal. She exhibits no distension. There is no tenderness. There is no rebound and no guarding.  Surgical incision well healed without erythema or discharge  Musculoskeletal: Normal range of motion. She exhibits edema and deformity. She exhibits no tenderness.  Neurological: She is alert and oriented to person, place, and time. She exhibits normal muscle tone.  Skin: Skin is warm and dry. She is not diaphoretic. No erythema.  Psychiatric: Her speech is normal. Judgment and thought content normal. Her affect is blunt. She is slowed and withdrawn. Cognition and memory are impaired. She exhibits a depressed mood.  Nursing note and vitals reviewed.     Assessment & Plan:   Please see problem oriented charting.
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
## 2017-12-13 NOTE — Assessment & Plan Note (Signed)
Assessment  Ms. Depaoli's affect was markedly different than all of the previous meetings that I had with her. Her affect was blunted and she was much less perky and communicative. She appeared depressed and when asked specific questions relating to feeling depressed, anxious, and anhedonia the responses were all positive. She also has been sleeping very poorly. I am hopeful that her mental decline recently is related to pseudodementia and that a major depressive disorder is playing a role. There also appears to be some psychotic features of hallucinations and paranoia per her sister's report.  Thus, she may benefit from SSRI therapy, particularly those SSRIs which may also be helpful for anxiety and PTSD.  Plan  We have started sertraline 50 mg by mouth daily. She was informed not to expect any improvement in her symptoms for the first 3 weeks, but was asked to continue the medication despite this. She was also informed that further titration of the dose was likely needed and could be done in her new primary care provider's office at [facility].  We are checking a TSH level which is pending at the time of this dictation. Her symptoms of depression and pseudodementia will be reassessed at the follow-up visit with her new primary care provider.

## 2017-12-13 NOTE — Assessment & Plan Note (Signed)
Assessment  She continues to take supplemental vitamin D and calcium. Although she is due for a DEXA scan there were other more pressing issues to address during this visit.  Plan  She will continue the calcium and vitamin D and we will discuss repeating the DEXA scan to reassess the state of her bone health at the follow-up visit with her new primary care provider.

## 2017-12-13 NOTE — Assessment & Plan Note (Signed)
Assessment  She has lost over 20 pounds after her acute illness with the small bowel obstruction related to her incarcerated hernia. She is now with a BMI less than 30 and therefore fits within the overweight class. Her appetite is good and I am not concerned about the negative impact of this weight loss as I believe nutritionally she will be sound.  Plan  She was praised for her weight loss, albeit coming at the expense of developing an acute illness. She was encouraged to continue to eat as she felt necessary. Her weight will be reassessed by her new primary care provider at the follow-up visit.
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
## 2017-12-13 NOTE — Assessment & Plan Note (Signed)
Assessment  With more help now that she is no longer living alone, she is wearing compression stockings at least every other day. This has resulted in some improvement in her chronic venous insufficiency and resolution of her chronic venous insufficiency ulcer. She continues on the Lasix 40 mg by mouth every morning, but notes urinary incontinence which may become problematic. Now that she is wearing the compression stockings I do not believe she requires that dose of Lasix.  Plan  We will lower the Lasix to 20 mg by mouth every morning. With this change we will also lower the potassium chloride to 20 mEq by mouth every morning. She was encouraged to continue using the compression stockings. At the follow-up visit we will reassess her chronic venous insufficiency and consider stopping the diuretic altogether, especially if she continues to have urinary incontinence.

## 2017-12-13 NOTE — Assessment & Plan Note (Signed)
Assessment  Her blood pressure today is at target at 124/54. This is on clonidine 0.3 milligrams by mouth twice daily, amlodipine 10 mg by mouth daily, and carvedilol 25 mg by mouth twice daily.  Plan  We will continue clonidine at 0.3 mg by mouth twice daily, amlodipine 10 mg by mouth daily, and carvedilol 25 mg by mouth twice daily. We will reassess the blood pressure at the follow-up visit with a new primary care provider.

## 2017-12-14 ENCOUNTER — Telehealth: Payer: Self-pay | Admitting: *Deleted

## 2017-12-14 LAB — BMP8+ANION GAP
Anion Gap: 15 mmol/L (ref 10.0–18.0)
BUN / CREAT RATIO: 13 (ref 12–28)
BUN: 8 mg/dL (ref 8–27)
CHLORIDE: 99 mmol/L (ref 96–106)
CO2: 29 mmol/L (ref 20–29)
Calcium: 10.2 mg/dL (ref 8.7–10.3)
Creatinine, Ser: 0.6 mg/dL (ref 0.57–1.00)
GFR calc Af Amer: 101 mL/min/{1.73_m2} (ref >59)
GFR calc non Af Amer: 88 mL/min/{1.73_m2} (ref >59)
GLUCOSE: 103 mg/dL — AB (ref 65–99)
POTASSIUM: 3.6 mmol/L (ref 3.5–5.2)
SODIUM: 143 mmol/L (ref 134–144)

## 2017-12-14 LAB — PHOSPHORUS: Phosphorus: 3.4 mg/dL (ref 2.5–4.5)

## 2017-12-14 LAB — VITAMIN B12: Vitamin B-12: 775 pg/mL (ref 232–1245)

## 2017-12-14 LAB — MAGNESIUM: Magnesium: 1.7 mg/dL (ref 1.6–2.3)

## 2017-12-14 LAB — TSH: TSH: 2.48 u[IU]/mL (ref 0.450–4.500)

## 2017-12-14 NOTE — Telephone Encounter (Signed)
Anna Genre, Speech Therapist with [facility], called requesting VO for speech therapy once weekly for 1 week and twice weekly for 3 weeks to work on cognition. Will route to PCP for consideration. Hubbard Hartshorn, RN, BSN

## 2017-12-17 ENCOUNTER — Telehealth: Payer: Self-pay

## 2017-12-17 NOTE — Telephone Encounter (Signed)
That would be fine.  Thank You.

## 2017-12-17 NOTE — Telephone Encounter (Signed)
Do you agree, if you do will call and inform [place]

## 2017-12-17 NOTE — Telephone Encounter (Signed)
Yes.  Thank you.

## 2017-12-17 NOTE — Telephone Encounter (Signed)
Amanda Roman with [facility] requesting VO for skilled nursing, disease mangement, medication and education. Please call back.

## 2017-12-18 DIAGNOSIS — R1310 Dysphagia, unspecified: Secondary | ICD-10-CM | POA: Diagnosis not present

## 2017-12-18 DIAGNOSIS — I872 Venous insufficiency (chronic) (peripheral): Secondary | ICD-10-CM | POA: Diagnosis not present

## 2017-12-18 DIAGNOSIS — Z87440 Personal history of urinary (tract) infections: Secondary | ICD-10-CM | POA: Diagnosis not present

## 2017-12-18 DIAGNOSIS — I119 Hypertensive heart disease without heart failure: Secondary | ICD-10-CM | POA: Diagnosis not present

## 2017-12-18 DIAGNOSIS — Z48815 Encounter for surgical aftercare following surgery on the digestive system: Secondary | ICD-10-CM | POA: Diagnosis not present

## 2017-12-18 DIAGNOSIS — Z9181 History of falling: Secondary | ICD-10-CM | POA: Diagnosis not present

## 2017-12-18 NOTE — Telephone Encounter (Signed)
Verbal auth for speech therapy left on [place]'s self-identified VM. Hubbard Hartshorn, RN, BSN

## 2017-12-18 NOTE — Telephone Encounter (Signed)
Have rtc to [place], no answer, no vmail

## 2017-12-21 NOTE — Progress Notes (Signed)
Patient ID: Amanda Roman, female   DOB: Sep 21, 1940, 77 yAmandao.   MRN: [id number]  BMP: Unremarkable  TSH [name]  Vitamin B12 775  No explanation for symptoms.  I called Amanda Roman,. Amanda Roman, Amanda Roman. Slaugh sister, at her request to inform her of the normal labs.  She told me that she saw Amanda Roman at [facility] yesterday and they remain hopeful they will be able to start there on January 07, 2018.

## 2018-02-07 ENCOUNTER — Telehealth: Payer: Self-pay | Admitting: Internal Medicine

## 2018-02-07 ENCOUNTER — Telehealth: Payer: Self-pay | Admitting: *Deleted

## 2018-02-07 NOTE — Telephone Encounter (Signed)
Amanda Roman wanted to let you know, that the pt is at [facility]. She is no longer a patient of Dr. Eppie Gibson. Pt sister # [phone]

## 2018-02-07 NOTE — Telephone Encounter (Signed)
Courtesy call made to patient to see if she is now being followed by [facility]. No answer, HIPPA compliant message left on recorder.Despina Hidden Cassady8/1/20192:42 PM

## 2018-08-15 ENCOUNTER — Emergency Department (HOSPITAL_COMMUNITY): Payer: Medicare (Managed Care)

## 2018-08-15 ENCOUNTER — Inpatient Hospital Stay (HOSPITAL_COMMUNITY)
Admission: EM | Admit: 2018-08-15 | Discharge: 2018-08-20 | DRG: 100 | Disposition: A | Discharge status: Hospice | Payer: Medicare (Managed Care) | Attending: Internal Medicine | Admitting: Internal Medicine

## 2018-08-15 ENCOUNTER — Other Ambulatory Visit: Payer: Self-pay

## 2018-08-15 DIAGNOSIS — F339 Major depressive disorder, recurrent, unspecified: Secondary | ICD-10-CM | POA: Diagnosis not present

## 2018-08-15 DIAGNOSIS — R159 Full incontinence of feces: Secondary | ICD-10-CM | POA: Diagnosis not present

## 2018-08-15 DIAGNOSIS — R001 Bradycardia, unspecified: Secondary | ICD-10-CM | POA: Diagnosis present

## 2018-08-15 DIAGNOSIS — F0390 Unspecified dementia without behavioral disturbance: Secondary | ICD-10-CM | POA: Diagnosis not present

## 2018-08-15 DIAGNOSIS — E785 Hyperlipidemia, unspecified: Secondary | ICD-10-CM | POA: Diagnosis present

## 2018-08-15 DIAGNOSIS — G40101 Localization-related (focal) (partial) symptomatic epilepsy and epileptic syndromes with simple partial seizures, not intractable, with status epilepticus: Secondary | ICD-10-CM | POA: Diagnosis present

## 2018-08-15 DIAGNOSIS — Z6841 Body Mass Index (BMI) 40.0 and over, adult: Secondary | ICD-10-CM

## 2018-08-15 DIAGNOSIS — R402212 Coma scale, best verbal response, none, at arrival to emergency department: Secondary | ICD-10-CM | POA: Diagnosis present

## 2018-08-15 DIAGNOSIS — I639 Cerebral infarction, unspecified: Secondary | ICD-10-CM | POA: Diagnosis not present

## 2018-08-15 DIAGNOSIS — D649 Anemia, unspecified: Secondary | ICD-10-CM | POA: Diagnosis present

## 2018-08-15 DIAGNOSIS — R402312 Coma scale, best motor response, none, at arrival to emergency department: Secondary | ICD-10-CM | POA: Diagnosis present

## 2018-08-15 DIAGNOSIS — R451 Restlessness and agitation: Secondary | ICD-10-CM | POA: Diagnosis not present

## 2018-08-15 DIAGNOSIS — Z79899 Other long term (current) drug therapy: Secondary | ICD-10-CM

## 2018-08-15 DIAGNOSIS — E872 Acidosis: Secondary | ICD-10-CM | POA: Diagnosis not present

## 2018-08-15 DIAGNOSIS — K219 Gastro-esophageal reflux disease without esophagitis: Secondary | ICD-10-CM | POA: Diagnosis present

## 2018-08-15 DIAGNOSIS — E876 Hypokalemia: Secondary | ICD-10-CM | POA: Diagnosis present

## 2018-08-15 DIAGNOSIS — G9340 Encephalopathy, unspecified: Secondary | ICD-10-CM | POA: Diagnosis not present

## 2018-08-15 DIAGNOSIS — I872 Venous insufficiency (chronic) (peripheral): Secondary | ICD-10-CM | POA: Diagnosis present

## 2018-08-15 DIAGNOSIS — F419 Anxiety disorder, unspecified: Secondary | ICD-10-CM | POA: Diagnosis present

## 2018-08-15 DIAGNOSIS — F0150 Vascular dementia without behavioral disturbance: Secondary | ICD-10-CM

## 2018-08-15 DIAGNOSIS — Z9049 Acquired absence of other specified parts of digestive tract: Secondary | ICD-10-CM

## 2018-08-15 DIAGNOSIS — X58XXXA Exposure to other specified factors, initial encounter: Secondary | ICD-10-CM | POA: Diagnosis not present

## 2018-08-15 DIAGNOSIS — R569 Unspecified convulsions: Secondary | ICD-10-CM

## 2018-08-15 DIAGNOSIS — R011 Cardiac murmur, unspecified: Secondary | ICD-10-CM | POA: Diagnosis not present

## 2018-08-15 DIAGNOSIS — G40901 Epilepsy, unspecified, not intractable, with status epilepticus: Secondary | ICD-10-CM

## 2018-08-15 DIAGNOSIS — Z515 Encounter for palliative care: Secondary | ICD-10-CM | POA: Diagnosis not present

## 2018-08-15 DIAGNOSIS — F329 Major depressive disorder, single episode, unspecified: Secondary | ICD-10-CM | POA: Diagnosis present

## 2018-08-15 DIAGNOSIS — R509 Fever, unspecified: Secondary | ICD-10-CM | POA: Diagnosis present

## 2018-08-15 DIAGNOSIS — Z7982 Long term (current) use of aspirin: Secondary | ICD-10-CM | POA: Diagnosis not present

## 2018-08-15 DIAGNOSIS — Z66 Do not resuscitate: Secondary | ICD-10-CM | POA: Diagnosis present

## 2018-08-15 DIAGNOSIS — R402112 Coma scale, eyes open, never, at arrival to emergency department: Secondary | ICD-10-CM | POA: Diagnosis present

## 2018-08-15 DIAGNOSIS — I10 Essential (primary) hypertension: Secondary | ICD-10-CM | POA: Diagnosis not present

## 2018-08-15 DIAGNOSIS — F4310 Post-traumatic stress disorder, unspecified: Secondary | ICD-10-CM | POA: Diagnosis present

## 2018-08-15 DIAGNOSIS — Z880 Allergy status to penicillin: Secondary | ICD-10-CM

## 2018-08-15 DIAGNOSIS — Z888 Allergy status to other drugs, medicaments and biological substances status: Secondary | ICD-10-CM

## 2018-08-15 DIAGNOSIS — I119 Hypertensive heart disease without heart failure: Secondary | ICD-10-CM | POA: Diagnosis present

## 2018-08-15 DIAGNOSIS — R32 Unspecified urinary incontinence: Secondary | ICD-10-CM | POA: Diagnosis not present

## 2018-08-15 DIAGNOSIS — S71001A Unspecified open wound, right hip, initial encounter: Secondary | ICD-10-CM | POA: Diagnosis not present

## 2018-08-15 DIAGNOSIS — M8580 Other specified disorders of bone density and structure, unspecified site: Secondary | ICD-10-CM | POA: Diagnosis present

## 2018-08-15 DIAGNOSIS — G9349 Other encephalopathy: Secondary | ICD-10-CM | POA: Diagnosis present

## 2018-08-15 DIAGNOSIS — Z8249 Family history of ischemic heart disease and other diseases of the circulatory system: Secondary | ICD-10-CM

## 2018-08-15 DIAGNOSIS — Z87891 Personal history of nicotine dependence: Secondary | ICD-10-CM

## 2018-08-15 DIAGNOSIS — R4182 Altered mental status, unspecified: Secondary | ICD-10-CM | POA: Diagnosis not present

## 2018-08-15 DIAGNOSIS — R419 Unspecified symptoms and signs involving cognitive functions and awareness: Secondary | ICD-10-CM | POA: Diagnosis not present

## 2018-08-15 DIAGNOSIS — Z885 Allergy status to narcotic agent status: Secondary | ICD-10-CM

## 2018-08-15 DIAGNOSIS — Z833 Family history of diabetes mellitus: Secondary | ICD-10-CM

## 2018-08-15 LAB — CBC WITH DIFFERENTIAL/PLATELET
Abs Immature Granulocytes: 0.02 10*3/uL (ref 0.00–0.07)
Basophils Absolute: 0 10*3/uL (ref 0.0–0.1)
Basophils Relative: 0 %
Eosinophils Absolute: 0 10*3/uL (ref 0.0–0.5)
Eosinophils Relative: 0 %
HCT: 30.9 % — ABNORMAL LOW (ref 36.0–46.0)
Hemoglobin: 9.8 g/dL — ABNORMAL LOW (ref 12.0–15.0)
IMMATURE GRANULOCYTES: 0 %
Lymphocytes Relative: 9 %
Lymphs Abs: 0.6 10*3/uL — ABNORMAL LOW (ref 0.7–4.0)
MCH: 29.5 pg (ref 26.0–34.0)
MCHC: 31.7 g/dL (ref 30.0–36.0)
MCV: 93.1 fL (ref 80.0–100.0)
MONOS PCT: 6 %
Monocytes Absolute: 0.3 10*3/uL (ref 0.1–1.0)
NEUTROS PCT: 85 %
NRBC: 0 % (ref 0.0–0.2)
Neutro Abs: 5 10*3/uL (ref 1.7–7.7)
Platelets: 186 10*3/uL (ref 150–400)
RBC: 3.32 MIL/uL — ABNORMAL LOW (ref 3.87–5.11)
RDW: 13.1 % (ref 11.5–15.5)
WBC: 5.9 10*3/uL (ref 4.0–10.5)

## 2018-08-15 LAB — URINALYSIS, ROUTINE W REFLEX MICROSCOPIC
Bacteria, UA: NONE SEEN
Bilirubin Urine: NEGATIVE
Glucose, UA: NEGATIVE mg/dL
Hgb urine dipstick: NEGATIVE
Ketones, ur: NEGATIVE mg/dL
Leukocytes, UA: NEGATIVE
Nitrite: NEGATIVE
PH: 6 (ref 5.0–8.0)
Protein, ur: 30 mg/dL — AB
Specific Gravity, Urine: 1.013 (ref 1.005–1.030)

## 2018-08-15 LAB — CBG MONITORING, ED: Glucose-Capillary: 171 mg/dL — ABNORMAL HIGH (ref 70–99)

## 2018-08-15 LAB — INFLUENZA PANEL BY PCR (TYPE A & B)
Influenza A By PCR: NEGATIVE
Influenza B By PCR: NEGATIVE

## 2018-08-15 LAB — COMPREHENSIVE METABOLIC PANEL
ALT: 38 U/L (ref 0–44)
AST: 45 U/L — ABNORMAL HIGH (ref 15–41)
Albumin: 3.2 g/dL — ABNORMAL LOW (ref 3.5–5.0)
Alkaline Phosphatase: 68 U/L (ref 38–126)
Anion gap: 16 — ABNORMAL HIGH (ref 5–15)
BUN: 8 mg/dL (ref 8–23)
CHLORIDE: 106 mmol/L (ref 98–111)
CO2: 21 mmol/L — ABNORMAL LOW (ref 22–32)
CREATININE: 0.83 mg/dL (ref 0.44–1.00)
Calcium: 9 mg/dL (ref 8.9–10.3)
GFR calc Af Amer: >60 mL/min (ref >60)
GFR calc non Af Amer: >60 mL/min (ref >60)
Glucose, Bld: 156 mg/dL — ABNORMAL HIGH (ref 70–99)
Potassium: 3.2 mmol/L — ABNORMAL LOW (ref 3.5–5.1)
Sodium: 143 mmol/L (ref 135–145)
Total Bilirubin: 0.6 mg/dL (ref 0.3–1.2)
Total Protein: 7 g/dL (ref 6.5–8.1)

## 2018-08-15 LAB — LACTIC ACID, PLASMA: Lactic Acid, Venous: 1.9 mmol/L (ref 0.5–1.9)

## 2018-08-15 LAB — CK: Total CK: 79 U/L (ref 38–234)

## 2018-08-15 MED ORDER — LEVETIRACETAM IN NACL 500 MG/100ML IV SOLN
500.0000 mg | Freq: Two times a day (BID) | INTRAVENOUS | Status: DC
  Administered 2018-08-16: 500 mg via INTRAVENOUS
  Filled 2018-08-15 (×2): qty 100

## 2018-08-15 MED ORDER — MIDAZOLAM HCL 2 MG/2ML IJ SOLN
1.0000 mg | INTRAMUSCULAR | Status: DC | PRN
  Administered 2018-08-15: 1 mg via INTRAVENOUS

## 2018-08-15 MED ORDER — LEVETIRACETAM IN NACL 1500 MG/100ML IV SOLN
1500.0000 mg | Freq: Once | INTRAVENOUS | Status: AC
  Administered 2018-08-15: 1500 mg via INTRAVENOUS

## 2018-08-15 MED ORDER — MIDAZOLAM HCL (PF) 10 MG/2ML IJ SOLN: 1.0000 mg | INTRAMUSCULAR | Status: DC | PRN

## 2018-08-15 MED ORDER — VANCOMYCIN HCL IN DEXTROSE 1-5 GM/200ML-% IV SOLN
1000.0000 mg | Freq: Once | INTRAVENOUS | Status: DC
  Filled 2018-08-15: qty 200

## 2018-08-15 MED ORDER — ACETAMINOPHEN 650 MG RE SUPP
650.0000 mg | Freq: Once | RECTAL | Status: AC
  Administered 2018-08-15: 650 mg via RECTAL
  Filled 2018-08-15: qty 1

## 2018-08-15 MED ORDER — LEVETIRACETAM IN NACL 500 MG/100ML IV SOLN
500.0000 mg | Freq: Two times a day (BID) | INTRAVENOUS | Status: DC
  Filled 2018-08-15: qty 100

## 2018-08-15 MED ORDER — VANCOMYCIN HCL 10 G IV SOLR
1500.0000 mg | Freq: Once | INTRAVENOUS | Status: AC
  Administered 2018-08-15: 1500 mg via INTRAVENOUS
  Filled 2018-08-15: qty 1500

## 2018-08-15 MED ORDER — SODIUM CHLORIDE 0.9 % IV BOLUS
500.0000 mL | Freq: Once | INTRAVENOUS | Status: AC
  Administered 2018-08-15: 500 mL via INTRAVENOUS

## 2018-08-15 MED ORDER — SENNOSIDES-DOCUSATE SODIUM 8.6-50 MG PO TABS: 1.0000 | ORAL_TABLET | Freq: Every evening | ORAL | Status: DC | PRN

## 2018-08-15 MED ORDER — POTASSIUM CHLORIDE IN NACL 40-0.9 MEQ/L-% IV SOLN
INTRAVENOUS | Status: AC
  Administered 2018-08-15: 100 mL/h via INTRAVENOUS
  Filled 2018-08-15: qty 1000

## 2018-08-15 MED ORDER — LORAZEPAM 2 MG/ML IJ SOLN
2.0000 mg | Freq: Once | INTRAMUSCULAR | Status: AC
  Administered 2018-08-15: 2 mg via INTRAVENOUS

## 2018-08-15 MED ORDER — ACETAMINOPHEN 325 MG PO TABS: 650.0000 mg | ORAL_TABLET | Freq: Four times a day (QID) | ORAL | Status: DC | PRN

## 2018-08-15 MED ORDER — ACETAMINOPHEN 650 MG RE SUPP
650.0000 mg | Freq: Four times a day (QID) | RECTAL | Status: DC | PRN
  Administered 2018-08-17: 650 mg via RECTAL
  Filled 2018-08-15: qty 1

## 2018-08-15 MED ORDER — DEXTROSE 5 % IV SOLN
10.0000 mg/kg | Freq: Two times a day (BID) | INTRAVENOUS | Status: DC
  Administered 2018-08-15 – 2018-08-16 (×2): 680 mg via INTRAVENOUS
  Filled 2018-08-15 (×2): qty 13.6

## 2018-08-15 MED ORDER — SODIUM CHLORIDE 0.9 % IV SOLN
2.0000 g | Freq: Once | INTRAVENOUS | Status: AC
  Administered 2018-08-15: 2 g via INTRAVENOUS
  Filled 2018-08-15: qty 20

## 2018-08-15 MED ORDER — LORAZEPAM 2 MG/ML IJ SOLN: 0.5000 mg | Freq: Two times a day (BID) | INTRAMUSCULAR | Status: DC | PRN

## 2018-08-15 MED ORDER — MIDAZOLAM HCL 2 MG/2ML IJ SOLN
INTRAMUSCULAR | Status: AC
  Filled 2018-08-15: qty 2

## 2018-08-15 MED ORDER — ENOXAPARIN SODIUM 40 MG/0.4ML ~~LOC~~ SOLN
40.0000 mg | Freq: Every day | SUBCUTANEOUS | Status: DC
  Administered 2018-08-16 – 2018-08-19 (×5): 40 mg via SUBCUTANEOUS
  Filled 2018-08-15 (×5): qty 0.4
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
## 2018-08-15 NOTE — ED Notes (Signed)
Pt has not seized since receiving midazolam

## 2018-08-15 NOTE — ED Triage Notes (Addendum)
Pt BIB [facility] from [facility], called out for loc, RN found the pt seizing and making grunting noises. No hx of seizures. Hx BH issues and take lorazepam. Hx of fluid retention and takes lasix. Pt has been having seizures on and off for about 30 minutes total now. Pt has DNR but it is not dated. No LSN. Pt being bagged with 2 NPA's in.
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
## 2018-08-15 NOTE — ED Notes (Signed)
Pt having thick white mucous coming out of both NPA's, continues to be suctioned by this RN. Breath sounds are crackly.

## 2018-08-15 NOTE — ED Notes (Signed)
CBC recollected and sent down to lab.
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
## 2018-08-15 NOTE — ED Notes (Signed)
Dr Gilford Raid spoke with pt's sister. She is the POA and does not want intubation, compressions or bagging. Pt placed on NRB. Family wants pt to be comfortable.

## 2018-08-15 NOTE — ED Notes (Signed)
[facility] RN called this and stated that they can be reached at [phone], then press 1 for on call RN. If pt gets admitted they want to be reached or if there is a questions about history/medication. Family is aware to call as well.

## 2018-08-15 NOTE — ED Notes (Signed)
Lab called and blood was unable to be run due to clots.  Will recollect

## 2018-08-15 NOTE — ED Notes (Signed)
Family at bedside, pt actively seizing.

## 2018-08-16 ENCOUNTER — Inpatient Hospital Stay (HOSPITAL_COMMUNITY): Payer: Medicare (Managed Care)

## 2018-08-16 DIAGNOSIS — F419 Anxiety disorder, unspecified: Secondary | ICD-10-CM

## 2018-08-16 DIAGNOSIS — I10 Essential (primary) hypertension: Secondary | ICD-10-CM

## 2018-08-16 DIAGNOSIS — X58XXXA Exposure to other specified factors, initial encounter: Secondary | ICD-10-CM

## 2018-08-16 DIAGNOSIS — G9340 Encephalopathy, unspecified: Secondary | ICD-10-CM

## 2018-08-16 DIAGNOSIS — R4182 Altered mental status, unspecified: Secondary | ICD-10-CM

## 2018-08-16 DIAGNOSIS — R159 Full incontinence of feces: Secondary | ICD-10-CM

## 2018-08-16 DIAGNOSIS — G40901 Epilepsy, unspecified, not intractable, with status epilepticus: Secondary | ICD-10-CM

## 2018-08-16 DIAGNOSIS — F0390 Unspecified dementia without behavioral disturbance: Secondary | ICD-10-CM

## 2018-08-16 DIAGNOSIS — F339 Major depressive disorder, recurrent, unspecified: Secondary | ICD-10-CM

## 2018-08-16 DIAGNOSIS — Z66 Do not resuscitate: Secondary | ICD-10-CM

## 2018-08-16 DIAGNOSIS — R569 Unspecified convulsions: Secondary | ICD-10-CM

## 2018-08-16 DIAGNOSIS — Z79899 Other long term (current) drug therapy: Secondary | ICD-10-CM

## 2018-08-16 DIAGNOSIS — E876 Hypokalemia: Secondary | ICD-10-CM

## 2018-08-16 DIAGNOSIS — R32 Unspecified urinary incontinence: Secondary | ICD-10-CM

## 2018-08-16 DIAGNOSIS — S71001A Unspecified open wound, right hip, initial encounter: Secondary | ICD-10-CM

## 2018-08-16 LAB — CBC
HCT: 35.5 % — ABNORMAL LOW (ref 36.0–46.0)
Hemoglobin: 11.7 g/dL — ABNORMAL LOW (ref 12.0–15.0)
MCH: 30.4 pg (ref 26.0–34.0)
MCHC: 33 g/dL (ref 30.0–36.0)
MCV: 92.2 fL (ref 80.0–100.0)
NRBC: 0 % (ref 0.0–0.2)
Platelets: 208 10*3/uL (ref 150–400)
RBC: 3.85 MIL/uL — ABNORMAL LOW (ref 3.87–5.11)
RDW: 13.2 % (ref 11.5–15.5)
WBC: 7.7 10*3/uL (ref 4.0–10.5)

## 2018-08-16 LAB — RESPIRATORY PANEL BY PCR
Adenovirus: NOT DETECTED
Bordetella pertussis: NOT DETECTED
Chlamydophila pneumoniae: NOT DETECTED
Coronavirus 229E: NOT DETECTED
Coronavirus HKU1: NOT DETECTED
Coronavirus NL63: NOT DETECTED
Coronavirus OC43: NOT DETECTED
Influenza A: NOT DETECTED
Influenza B: NOT DETECTED
MYCOPLASMA PNEUMONIAE-RVPPCR: NOT DETECTED
Metapneumovirus: NOT DETECTED
Parainfluenza Virus 1: NOT DETECTED
Parainfluenza Virus 2: NOT DETECTED
Parainfluenza Virus 3: NOT DETECTED
Parainfluenza Virus 4: NOT DETECTED
RESPIRATORY SYNCYTIAL VIRUS-RVPPCR: NOT DETECTED
Rhinovirus / Enterovirus: NOT DETECTED

## 2018-08-16 LAB — BASIC METABOLIC PANEL
Anion gap: 11 (ref 5–15)
BUN: 5 mg/dL — ABNORMAL LOW (ref 8–23)
CO2: 31 mmol/L (ref 22–32)
Calcium: 9.1 mg/dL (ref 8.9–10.3)
Chloride: 102 mmol/L (ref 98–111)
Creatinine, Ser: 0.85 mg/dL (ref 0.44–1.00)
GFR calc Af Amer: >60 mL/min (ref >60)
GFR calc non Af Amer: >60 mL/min (ref >60)
GLUCOSE: 116 mg/dL — AB (ref 70–99)
Potassium: 3.5 mmol/L (ref 3.5–5.1)
Sodium: 144 mmol/L (ref 135–145)

## 2018-08-16 LAB — LACTIC ACID, PLASMA: Lactic Acid, Venous: 1.4 mmol/L (ref 0.5–1.9)

## 2018-08-16 LAB — AMMONIA: Ammonia: 15 umol/L (ref 9–35)

## 2018-08-16 LAB — PHENYTOIN LEVEL, TOTAL: Phenytoin Lvl: 17.9 ug/mL (ref 10.0–20.0)

## 2018-08-16 LAB — MAGNESIUM: Magnesium: 2 mg/dL (ref 1.7–2.4)

## 2018-08-16 MED ORDER — KCL IN DEXTROSE-NACL 30-5-0.45 MEQ/L-%-% IV SOLN
INTRAVENOUS | Status: AC
  Administered 2018-08-16 – 2018-08-17 (×2): via INTRAVENOUS
  Filled 2018-08-16 (×2): qty 1000

## 2018-08-16 MED ORDER — SODIUM CHLORIDE 0.9 % IV SOLN
100.0000 mg | Freq: Two times a day (BID) | INTRAVENOUS | Status: DC
  Administered 2018-08-17: 100 mg via INTRAVENOUS
  Filled 2018-08-16 (×2): qty 10

## 2018-08-16 MED ORDER — LORAZEPAM 2 MG/ML IJ SOLN: 1.0000 mg | Freq: Two times a day (BID) | INTRAMUSCULAR | Status: DC | PRN

## 2018-08-16 MED ORDER — LEVETIRACETAM IN NACL 1000 MG/100ML IV SOLN
1000.0000 mg | Freq: Two times a day (BID) | INTRAVENOUS | Status: DC
  Administered 2018-08-16 – 2018-08-17 (×2): 1000 mg via INTRAVENOUS
  Filled 2018-08-16 (×2): qty 100

## 2018-08-16 MED ORDER — PHENYTOIN SODIUM 50 MG/ML IJ SOLN
100.0000 mg | Freq: Three times a day (TID) | INTRAMUSCULAR | Status: DC
  Administered 2018-08-16 – 2018-08-19 (×8): 100 mg via INTRAVENOUS
  Filled 2018-08-16 (×8): qty 2

## 2018-08-16 MED ORDER — SODIUM CHLORIDE 0.9 % IV SOLN
200.0000 mg | Freq: Once | INTRAVENOUS | Status: AC
  Administered 2018-08-16: 200 mg via INTRAVENOUS
  Filled 2018-08-16: qty 20

## 2018-08-16 MED ORDER — SODIUM CHLORIDE 0.9 % IV SOLN
20.0000 mg/kg | Freq: Once | INTRAVENOUS | Status: AC
  Administered 2018-08-16: 1360 mg via INTRAVENOUS
  Filled 2018-08-16: qty 20

## 2018-08-16 NOTE — Progress Notes (Signed)
LTM started; Dr Cheral Marker aware and Dr Doree Albee notified. Nurse educated on event button.

## 2018-08-16 NOTE — Progress Notes (Signed)
Lab team was unable to get access for labs

## 2018-08-16 NOTE — Progress Notes (Signed)
EEG Completed; Results Pending  

## 2018-08-16 NOTE — Progress Notes (Addendum)
Discussed initial EEG tracings from LTM with Dr. Doree Albee:  There is diffuse slowing. Intermittent focal rhythmic right frontal slowing with sharp waves lasting about 5 seconds each. These are not sustained and also not evolving. Overall impression is that she is no longer exhibiting electrographic status and this pattern could be seen in resolving status epilepticus.   A/R: 1. Obtaining phenytoin level as it has been several hours since she was loaded.  2. If phenytoin level is low, will administer a supplemental load. If therapeutic, will add Vimpat.  3. Continuing LTM EEG 4. Continue scheduled Keppra and Dilantin.  Addendum: - Phenytoin level is 17.9 - Adding Vimpat with 200 mg IV load followed by 100 mg IV BID.   Electronically signed: Dr. Kerney Elbe
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
## 2018-08-16 NOTE — Procedures (Signed)
  Amanda Pena A. [name], MD     [email or web address]           HISTORY: This is a 78 year old female who presents with new onset seizures.  MEDICATIONS: Scheduled Meds: . enoxaparin (LOVENOX) injection  40 mg Subcutaneous QHS   Continuous Infusions: . levETIRAcetam 500 mg (08/16/18 0611)   PRN Meds:.acetaminophen **OR** acetaminophen, LORazepam, senna-docusate     ANALYSIS: A 16 channel recording using standard 10 20 measurements is conducted for 24 minutes.  The background activity gets as high as 7 Hz.  There are a few episodes of sharp wave activity involving the right anterior temporal region particularly after 8.  However, there are about 4 episodes of generalized spike slow wave rhythmic activity of varying frequency with maximal activity involving the right anterior temporal region particularly F8.  This is associated with 4-second burst suppression pattern.  The picture is consistent with electrographic seizures and therefore status epilepticus.   IMPRESSION: 1.  This recording is abnormal showing repeat electrographic generalized seizures with maximum activity involving the right anterior temporal region.  This is consistent with a diagnosis of status epilepticus.      Fawne Hughley A. Merlene Roman, M.D.  Diplomate, Tax adviser of Psychiatry and Neurology ( Neurology).

## 2018-08-16 NOTE — Consult Note (Signed)
[place] Nurse wound consult note Reason for Consult: multiple wounds Wound type: Scarred area over the right trochanter/2 small full thickness ulcerations. Unclear etiology. Patient is not able to give any history. No family in the room Pressure Injury POA: no Measurement: 0.2cm x 0.3cm x 0.3cm and linear area hyperkeratotic skin with epibole of wound edges. No real wound bed present Wound bed: open wound is clean, pink, superficial with evidence of healing  Drainage (amount, consistency, odor) none noted at the time of my assessment, no odor Periwound: intact with evidence of scarring over her body  Dressing procedure/placement/frequency: Foam dressing to protect, insulate wound bed.  Change every 3 days   Discussed POC with patient and bedside nurse.  Re consult if needed, will not follow at this time. Thanks  Meleah Demeyer [employer], RN,CWOCN, CNS, [place] [phone])

## 2018-08-16 NOTE — Progress Notes (Signed)
  Date: 08/16/2018  Patient name: Amanda Roman  Medical record number: [id number]  Date of birth: [birth year]   I have seen and evaluated this patient and I have discussed the plan of care with the house staff. Please see Dr. Dorothyann Peng note for complete details. I concur with her findings and plan.  Also reviewed Neurology note.  Please see discussion with patient's [facility] PCP Dr. Jimmye Norman.  If fosphenytoin load does not work for status epilepticus, patients goals would not include intubation or ICU stay.  We will follow up pending phenytoin loading with Neurology.   Sid Falcon, MD 08/16/2018, 2:55 PM

## 2018-08-16 NOTE — Progress Notes (Addendum)
Subjective: Patient was resting in bed, remains non-verbal. No acute events overnight and no family at bedside. She was not following commands.   Spoke with Dr. Jimmye Norman, patients PCP from [facility] who said that she had a long conversation with the son and daughter about patients hospital course. She informed me that the patient is DNR and they would not want her transferred to the ICU or intubated and if she did not improve they would want to pursue comfort measures. She informed me that if the patient's prognosis was < 2 weeks then they would want her transferred to [facility] and if it was >2 weeks they would want her transferred to [facility] for end of life care. Discussed that we would continue her current treatment but not escalate care to the ICU.   Objective:  Vital signs in last 24 hours: Vitals:   08/16/18 0100 08/16/18 0352 08/16/18 0700 08/16/18 0750  BP:  (!) 142/68 (!) 149/70 (!) 149/70  Pulse:  (!) 58 65 65  Resp:    20  Temp: 98.6 F (37 C) 98.5 F (36.9 C) 97.9 F (36.6 C) 97.9 F (36.6 C)  TempSrc: Axillary Axillary Axillary Axillary  SpO2:  100% 100% 100%  Weight:      Height:        General: Chronically ill appearing, NAD Cardiac: RRR, no m/r/g Pulmonary: CTABl, no wheezing or rhonchi Abdomen: Soft, non-tender, +BS Extremity:Trace BL LE edema, 2+ pulses throughout, warm extremities Neuro: Awake, not following commands, non-verbal, tracks movements, no withdrawal to pain  Assessment/Plan:  Active Problems:   Seizure ([place])  This is a 78 year old female with a history of dementia, depression, non-verbal at baseline requiring 24 hour assistance with ADLs who presented from [facility] with LOC and seizure like activities. She received IV versed x2. She also became apneic and required bagging for a short time. She had urinary and bowel incontinence however she has a history of this so it's unclear if this was related to a seizure. The seizure had subsided on  arrival to the ED. CT head showed chronic white matter changes. Neurology was consulted and patient was started on Keppra 500mg  BID.    New onset seizure 2/2 unclear etiology: Encephalopathy:  Patient has new onset seizures that improved with versed. She may have a history of sudden behavrioral arrest according to the sister. Patient was noted to be febrile on admission to 101. This may be related to an infectious cause however family declined any LP to rule out meningitis. She was given acyclovir and ceftriaxone empirically to treat. Today she is non-verbal, not following any commands and not withdrawing to pain.  -MRI showed limited exam due to motion artifact, no acute intracranial abnormality and mild chronic microvascular ischemic disease.  -EEG showed status epilepticus, findings concerning for non-convulsive status epilepticus -Neurology following, appreciate recommendations - Loading with fosphenytoin 20 mg/kg follow by 5 mg/kg IV TID -Increase Keppra 100 mg BID -STAT LTM EEG -Discontinue acyclovir and ceftriaxone at this time -Trend fever curve -Continue versed 1 mg q5 PRN -F/u blood cultures  Hypertension: -On amlodipine 10 mg daily, losartan 50 mg daily, clonidine 0.3 mg patch, and carvedilol 40 mg daily at home -BP's have been stable, hold home medications and resume as needed  Right trochater wound: -Multiple wounds on exam -Wound care consult  Hypokalemia: -K 3.2, repleating with maintenance fluids -BMP in AM  Dementia: Depression: Anxiety: -Continue home aricept and sertraline  FEN: NS at  100cc/hr x10hr, replete lytes prn, NPO VTE ppx: Lovenox  Code Status: DNR    Dispo: Anticipated discharge is pending clinical improvement.   Asencion Noble, MD 08/16/2018, 8:25 AM Pager: [phone]

## 2018-08-17 DIAGNOSIS — G40901 Epilepsy, unspecified, not intractable, with status epilepticus: Secondary | ICD-10-CM

## 2018-08-17 DIAGNOSIS — F0150 Vascular dementia without behavioral disturbance: Secondary | ICD-10-CM

## 2018-08-17 DIAGNOSIS — I10 Essential (primary) hypertension: Secondary | ICD-10-CM

## 2018-08-17 LAB — BASIC METABOLIC PANEL
Anion gap: 11 (ref 5–15)
BUN: <5 mg/dL — ABNORMAL LOW (ref 8–23)
CO2: 33 mmol/L — ABNORMAL HIGH (ref 22–32)
CREATININE: 0.58 mg/dL (ref 0.44–1.00)
Calcium: 9.5 mg/dL (ref 8.9–10.3)
Chloride: 96 mmol/L — ABNORMAL LOW (ref 98–111)
GFR calc Af Amer: >60 mL/min (ref >60)
GFR calc non Af Amer: >60 mL/min (ref >60)
Glucose, Bld: 130 mg/dL — ABNORMAL HIGH (ref 70–99)
Potassium: 3.7 mmol/L (ref 3.5–5.1)
Sodium: 140 mmol/L (ref 135–145)

## 2018-08-17 LAB — PHENYTOIN LEVEL, TOTAL: Phenytoin Lvl: 17.7 ug/mL (ref 10.0–20.0)

## 2018-08-17 LAB — URINE CULTURE: Culture: NO GROWTH

## 2018-08-17 LAB — PHOSPHORUS: Phosphorus: 2.3 mg/dL — ABNORMAL LOW (ref 2.5–4.6)

## 2018-08-17 MED ORDER — KCL IN DEXTROSE-NACL 30-5-0.45 MEQ/L-%-% IV SOLN
INTRAVENOUS | Status: AC
  Administered 2018-08-17 – 2018-08-18 (×2): via INTRAVENOUS
  Filled 2018-08-17 (×2): qty 1000

## 2018-08-17 MED ORDER — LEVETIRACETAM IN NACL 1500 MG/100ML IV SOLN
1500.0000 mg | Freq: Two times a day (BID) | INTRAVENOUS | Status: DC
  Administered 2018-08-17 – 2018-08-19 (×4): 1500 mg via INTRAVENOUS
  Filled 2018-08-17 (×5): qty 100

## 2018-08-17 MED ORDER — SODIUM CHLORIDE 0.9 % IV SOLN
200.0000 mg | Freq: Two times a day (BID) | INTRAVENOUS | Status: DC
  Administered 2018-08-17 – 2018-08-19 (×4): 200 mg via INTRAVENOUS
  Filled 2018-08-17 (×5): qty 20

## 2018-08-17 MED ORDER — LORAZEPAM 2 MG/ML IJ SOLN
2.0000 mg | Freq: Once | INTRAMUSCULAR | Status: AC
  Administered 2018-08-17: 2 mg via INTRAVENOUS
  Filled 2018-08-17: qty 1

## 2018-08-17 MED ORDER — POTASSIUM PHOSPHATES 15 MMOLE/5ML IV SOLN
10.0000 mmol | Freq: Once | INTRAVENOUS | Status: AC
  Administered 2018-08-17: 10 mmol via INTRAVENOUS
  Filled 2018-08-17 (×2): qty 3.33

## 2018-08-17 NOTE — Progress Notes (Signed)
LTM maintenance completed. No skin breakdown was seen. Reprepped Cz, P4.

## 2018-08-17 NOTE — Progress Notes (Signed)
No jerkign on my exam, EEG still with frequent discharges, but maybe some improvement. Though her EEG is still concerning, I do not think that her mental status could tolerate any more aggressive treatment, and she is DNR/DNI. Will continue current management for now. May need palliative involvement if no improvement.   Roland Rack, MD [facility] [phone]  If 7pm- 7am, please page neurology on call as listed in [place].

## 2018-08-17 NOTE — Progress Notes (Addendum)
Went back to the bedside to discuss findings with patient's sister and most recent finding on LTM EEG.  I have also discussed the changes made involving medication subsequent the most recent LTM report.    This was an abnormal continuous video EEG due to diffuse, right maximal slowing with frequent right frontal epileptiform discharges. There was some emergence of subtle subclinical seizures overnight, however these showed less sustained ictal patterns when compared to the patient's routine EEG on 08/16/18.   In fact she seems to be having seizure activity partial right arm and leg  while I'm present at the bedside. Patients sister states that "she had about 10 seizures while I've been here." She also states that the patient woke up today and was able to recite her favorite bible verse "the Reita Cliche is my shepard."   At this time she (Pt's sister) is not wanting to move forward with burst suppression, if seizure activity persist. She is aware that status could result in death; she is comfortable with medication titration only. She states that she has been in contact with a facility called [facility] for final palliative care and comfort care needs.  Amanda Roman is a DNR and her sister is not wanting to take further measures other than medication management.  I would suggest an official hospital palliative consult, and Case management to assist in placement if this is possible with her current state. Her sister states that she is fine with d/c tomorrow or the next day. I explained to her that this is a process, and would be further handled by the primary team and CM. From a neurological standpoint we are comfortable with her current decision to see this process from a spiritual point of view and elect for confort care.

## 2018-08-17 NOTE — Progress Notes (Signed)
Medicine attending: I examined this patient today together with resident physician Dr. Guadlupe Spanish and I concur with his evaluation and management plan. We appreciate ongoing input from neurology. Her seizures have subsided.  She remains nonverbal but will open her eyes to verbal stimulation and track.  No tonic-clonic activity noted but she is rigid on movement of her arms and legs. She is currently on Keppra, Vimpat, and phenytoin.  Dose adjustments per neurology recommendations. Family does not want aggressive ICU management in view of age and underlying dementia.

## 2018-08-17 NOTE — Progress Notes (Signed)
Notified by Dr. Doree Albee of continued electrographic seizure activity.   BP (!) 162/75 (BP Location: Right Arm)   Pulse 78   Temp 99 F (37.2 C) (Oral)   Resp (!) 22   Ht 5' (1.524 m)   Wt 68 kg   SpO2 100%   BMI 29.29 kg/m   Ment: Not answering questions Motor: Continous lower extremity and trunk jerking, worse on the left.   A/R: Recurrent seizures 1. Changing Vimpat to 200 mg IV BID, first dose now.  2. Ativan 2 mg IV x 1 3. Continue Dilantin at current dose. Level at 1147 today was 17.7 4. Continue Keppra  Electronically signed: Dr. Kerney Elbe

## 2018-08-17 NOTE — Progress Notes (Signed)
   Subjective: Patient was resting in bed, remains non-verbal. No acute events overnight and no family at bedside. She was not following commands but awakened easily and would track [place] with movement.     Objective:  Vital signs in last 24 hours: Vitals:   08/16/18 2313 08/17/18 0326 08/17/18 0328 08/17/18 0828  BP: (!) 165/70 (!) 137/109 133/69 (!) 164/74  Pulse: 67 65 70 71  Resp: 18 17  18   Temp: 98.1 F (36.7 C) 98.4 F (36.9 C)  99.4 F (37.4 C)  TempSrc: Axillary Axillary  Axillary  SpO2: 100% 100%  100%  Weight:      Height:        General: Chronically ill appearing, NAD Cardiac: RRR, no m/r/g Pulmonary: CTABl, no wheezing or rhonchi Abdomen: Soft, non-tender, +BS Extremity:Trace BL LE edema, 2+ pulses throughout, warm extremities Neuro: easily awakened, non-verbal, tracks movements, some rigidity in lower extremities  Assessment/Plan:  Active Problems:   Seizure ([place])  This is a 78 year old female with new seizure activity found to be in status epilepticus  New onset seizures likely 2/2 advanced dementia: Has responded to AED therapy without the use of intubation and burst suppression as per her goals of care.     -continuing titration of AED therapy per neurology appreciate assistance -maintain hydration, electrolyte balance  Hypertension: On amlodipine 10 mg daily, losartan 50 mg daily, clonidine 0.3 mg patch, and carvedilol 40 mg daily at home -BP slightly more elevated this morning will continue to monitor  Right trochater wound: -continue wound care  Hypokalemia: -replacing  Dementia: Depression: Anxiety: -Continue home aricept and sertraline  FEN:D51/2NS, replete lytes prn, NPO VTE ppx: Lovenox  Code Status: DNR    Dispo: Anticipated discharge is pending clinical improvement.   Katherine Roan, MD 08/17/2018, 10:16 AM

## 2018-08-17 NOTE — Procedures (Signed)
LTM-EEG Report  HISTORY: Continuous video-EEG monitoring performed for 78 year old with AMS and NCSE. ACQUISITION: International 10-20 system for electrode placement; 18 channels with additional eyes linked to ipsilateral ears and EKG. Additional T1-T2 electrodes were used. Continuous video recording obtained.   EEG NUMBER:  MEDICATIONS:  Day 1:  See EMR  DAY #1: from 1320 08/16/18 to 0730 08/17/18   BACKGROUND: An overall medium voltage continuous recording with some spontaneous variability and reactivity. Waking background consisted of a medium voltage 4-7 Hz pattern bilaterally with increased 1-3Hz  activity over the right hemisphere. State changes were present, however no clear sleep architecture was seen.  EPILEPTIFORM/PERIODIC ACTIVITY: Frequent runs of rhythmic right frontal 4-5hz  sharp waves, later showing some intrinsic evolution. These were unchanged overnight. SEIZURES: Emergence of subtle ictal evolution to runs of right frontal spikes then rhythmic 4-5Hz  sharp waves. These were preceded by brief periods of voltage attenuation.  EVENTS: none reported  EKG: no significant arrhythmia  SUMMARY: This was an abnormal continuous video EEG due to diffuse, right maximal slowing with frequent right frontal epileptiform discharges. There was some emergence of subtle subclinical seizures overnight, however these showed less sustained ictal patterns when compared to the patient's routine EEG on 08/16/18.

## 2018-08-17 NOTE — Progress Notes (Signed)
NEURO HOSPITALIST PROGRESS NOTE   Subjective c/c: New onset seizures  PMH- Advanced dementia, HTN, HLD   Admitted- 08/15/2018  She is resting and easy to awaken this morning with name call and voice. She remains with flat affect which seems to be her baseline. No clinical seizure activity seen this am.    Exam: Vitals:   08/17/18 0326 08/17/18 0328  BP: (!) 137/109 133/69  Pulse: 65 70  Resp: 17   Temp: 98.4 F (36.9 C)   SpO2: 100%     Physical Exam  Constitutional - frail appearing, abulic, non-verbal  HEENT-  Remains with gaze preference to left, but she does cross midline and track Cardiovascular- S1-S2 audible, pulses palpable throughout   Lungs- clear anterior  Abdomen- soft  Extremities- moves spontaneously Musculoskeletal-  Skin-warm and dry,intact. Old bilateral hand burn scar tissue noted.    Neuro:  Mental Status: flat affect, non verbal, does not respond to commands   Cranial Nerves: II: Does not blink to threat pupils are equal, round, and reactive to light.- does resist to eye lids being opened  III,IV, VI: Eyes are disconjugate V: VII: Corneals are intact Motor/ Sensory:  Does not grimace on application of noxious stimuli. She does have other non verbal cues other than withdrawal such as head turing and widening of eyes  Reflexes: Hyperreflexic in BLE. BUE 2+ reflexes. Cerebellar: Does not perform    Medications:  Scheduled: . enoxaparin (LOVENOX) injection  40 mg Subcutaneous QHS  . phenytoin (DILANTIN) IV  100 mg Intravenous Q8H   Continuous: . dexrose 5 % and 0.45 % NaCl with KCl 30 mEq/L 75 mL/hr at 08/17/18 0300  . lacosamide (VIMPAT) IV 100 mg (08/17/18 0654)  . levETIRAcetam 1,000 mg (08/17/18 0530)   XBM:[id number] **OR** acetaminophen, LORazepam, senna-docusate  Pertinent Labs/Diagnostics: 08/16/18 Phenytoin level is 17.9 Ammonia 15- normal  Lactic- normal 1.4 Resp panel normal without detections of bacteria or  virus   CBC and CMP unremarkable without concerns today  Ct Head Wo Contrast  Result Date: 08/15/2018 CLINICAL DATA:  78 year old female with altered mental status, seizure type activity. EXAM: CT HEAD WITHOUT CONTRAST TECHNIQUE: Contiguous axial images were obtained from the base of the skull through the vertex without intravenous contrast. COMPARISON:  Head CT 11/02/2017. FINDINGS: Brain: Stable cerebral volume, within normal limits for age. No midline shift, ventriculomegaly, mass effect, evidence of mass lesion, intracranial hemorrhage or evidence of cortically based acute infarction. Mild for age scattered white matter hypodensity appears stable. No cortical encephalomalacia identified. Vascular: Calcified atherosclerosis at the skull base. No suspicious intracranial vascular hyperdensity. Skull: Stable.  No acute osseous abnormality identified. Sinuses/Orbits: Nasal airway in place. Visualized paranasal sinuses and mastoids are stable and well pneumatized. Other: Negative visible orbit and scalp soft tissues. IMPRESSION: Stable since 2019 and largely normal for age non contrast CT appearance of the brain. Electronically Signed   By: Genevie Ann M.D.   On: 08/15/2018 18:12   Mr Brain Wo Contrast  Result Date: 08/16/2018 CLINICAL DATA:  Initial evaluation for acute seizure. EXAM: MRI HEAD WITHOUT CONTRAST TECHNIQUE: Multiplanar, multiecho pulse sequences of the brain and surrounding structures were obtained without intravenous contrast. COMPARISON:  Prior CT from 08/15/2017 FINDINGS: Brain: Examination degraded by motion artifact. Generalized age appropriate cerebral volume. Mild chronic microvascular ischemic changes present within the periventricular white matter. No abnormal foci of restricted diffusion  to suggest acute or subacute ischemia. Gray-white matter differentiation maintained. No encephalomalacia to suggest chronic cortical infarction. No acute intracranial hemorrhage. Single chronic  microhemorrhage noted within the left thalamus. No mass lesion, midline shift or mass effect. No hydrocephalus. No extra-axial fluid collection. Pituitary gland suprasellar region normal. Vascular: Major intracranial vascular flow voids maintained at the skull base. Skull and upper cervical spine: Craniocervical junction within normal limits. Multilevel degenerate spondylolysis noted within the upper cervical spine without significant stenosis. Bone marrow signal intensity normal. No scalp soft tissue abnormality. Sinuses/Orbits: Globes and orbital soft tissues grossly within normal limits. Scattered mucosal thickening within the ethmoidal air cells and maxillary sinuses. No air-fluid level to suggest acute sinusitis. Mastoid air cells grossly clear. Inner ear structures normal. Other: None. IMPRESSION: 1. Technically limited exam due to motion artifact. 2. No acute intracranial abnormality. 3. Mild chronic microvascular ischemic disease for age. Electronically Signed   By: Jeannine Boga M.D.   On: 08/16/2018 04:00   Dg Chest Portable 1 View  Result Date: 08/15/2018 CLINICAL DATA:  Postictal EXAM: PORTABLE CHEST 1 VIEW COMPARISON:  11/05/2017 FINDINGS: Shallow inspiration. Cardiac enlargement. No vascular congestion, edema, or consolidation. No blunting of costophrenic angles. No pneumothorax. Mediastinal contours appear intact. Degenerative changes in the spine and shoulders. IMPRESSION: Cardiac enlargement. No evidence of active pulmonary disease. Electronically Signed   By: Lucienne Capers M.D.   On: 08/15/2018 20:23   I have overviewed these images and reports:  CT brain w/o contrast  Stable since 2019 and largely normal for age non contrast CT appearance of the brain.  LTM EEG: 08/16/2018:  There is diffuse slowing. Intermittent focal rhythmic right frontal slowing with sharp waves lasting about 5 seconds each. These are not sustained and also not evolving. Overall impression is that she is no  longer exhibiting electrographic status and this pattern could be seen in resolving status epilepticus.  LTM EEG 08/17/18: This was an abnormal continuous video EEG due to diffuse, right maximal slowing with frequent right frontal epileptiform discharges. There was some emergence of subtle subclinical seizures overnight, however these showed less sustained ictal patterns when compared to the patient's routine EEG on 08/16/18.    MRI brain w/o contrast 08/16/18 1. Technically limited exam due to motion artifact. 2. No acute intracranial abnormality. 3. Mild chronic microvascular ischemic disease for age   Assessment:   78 year old female with new onset seizures likely secondary to dementia.  The episodes of behavioral arrest that the sister describes could have been partial seizures in the past, though this is unclear.   1. Clinical seizure activity was initially controlled following initiation of Keppra. Subsequent exam findings were suggestive of subtle clinical seizure activity and spot EEG showed correlate of repeat electrographic generalized seizures with maximum activity involving the right anterior temporal region, consistent with status epilepticus. Phenytoin was then started. Subsequent LTM EEG tracings showed improvement after the phenytoin load, but with some recurrent seizures with rhythmic focal slowing in the right frontal lobe. Vimpat was then started and overnight LTM EEG has shown some improvement, but frequent right frontal epileptiform discharges were noted, as well as subtle subclinical seizures overnight.   2. There is mild chronic microvascular ischemia noted on MRI.  3. Seizures likely secondary to dementia.    Recommendations: 1. Will need to continue LTM as she has continued seizure activity overnight on EEG with clinical correlate of leftward gaze deviation.  2. Increasing Keppra to 1500 mg IV BID 3. Continue Vimpat at 100 mg  IV BID 4. Continue phenytoin at 100 mg IV TID.  Obtaining repeat level   5. Due to her advanced age and relatively high likelihood of not being able to wean off vent, sedation with Versed or propofol with burst suppression protocol is not indicated. Will continue to titrate anticonvulsants using EEG guidance.     Lilyan Gilford, neuro-hospitalist NP  Electronically signed: Dr. Kerney Elbe 08/17/2018, 7:39 AM   Electronically signed: Dr. Kerney Elbe

## 2018-08-18 ENCOUNTER — Inpatient Hospital Stay: Payer: Self-pay

## 2018-08-18 LAB — BASIC METABOLIC PANEL
Anion gap: 8 (ref 5–15)
CO2: 36 mmol/L — ABNORMAL HIGH (ref 22–32)
Calcium: 9.4 mg/dL (ref 8.9–10.3)
Chloride: 99 mmol/L (ref 98–111)
Creatinine, Ser: 0.56 mg/dL (ref 0.44–1.00)
GFR calc Af Amer: >60 mL/min (ref >60)
GFR calc non Af Amer: >60 mL/min (ref >60)
Glucose, Bld: 111 mg/dL — ABNORMAL HIGH (ref 70–99)
Potassium: 3.7 mmol/L (ref 3.5–5.1)
SODIUM: 143 mmol/L (ref 135–145)

## 2018-08-18 LAB — PHOSPHORUS: PHOSPHORUS: 2.8 mg/dL (ref 2.5–4.6)

## 2018-08-18 LAB — MAGNESIUM: Magnesium: 1.6 mg/dL — ABNORMAL LOW (ref 1.7–2.4)

## 2018-08-18 MED ORDER — PHENOBARBITAL SODIUM 65 MG/ML IJ SOLN
65.0000 mg | Freq: Two times a day (BID) | INTRAMUSCULAR | Status: DC
  Administered 2018-08-18 – 2018-08-19 (×3): 65 mg via INTRAVENOUS
  Filled 2018-08-18 (×3): qty 1

## 2018-08-18 NOTE — Progress Notes (Signed)
   Subjective: Patient was laying in bed this morning, no family at bedside. NAD. Would no open eyes to vermal stimulation, slight grimace with sternal rub. No tracking while I was in the room. Overnight patient was still having frequent discharged with maybe some improvement.   Objective:  Vital signs in last 24 hours: Vitals:   08/17/18 1919 08/17/18 1921 08/17/18 2324 08/18/18 0415  BP:  (!) 162/75 (!) 173/73 (!) 165/72  Pulse:  78 60 (!) 57  Resp:  (!) 22 20 20   Temp: 99 F (37.2 C)  97.7 F (36.5 C) 98.4 F (36.9 C)  TempSrc: Axillary  Axillary Axillary  SpO2:  100% 100% 100%  Weight:      Height:        General: Chronically ill appearing, NAD, EEG wires in place Cardiac: Systolic murmur, RRR Pulmonary: CTABL, no wheezing or rhonchi Abdomen: Soft, non-tender Neuro: Non-verbal, no eye opening to painful stimulation, some grimace to sternal rub    Assessment/Plan:  Active Problems:   Seizure (HCC)   Status epilepticus (HCC)   HTN (hypertension), benign   Vascular dementia, uncomplicated ([place])  This is a 78 year old female with a history of dementia, depression, non-verbal at baseline requiring 24 hour assistance with ADLs who presented from [facility] with LOC and seizure like activities. She received IV versed x2. She also became apneic and required bagging for a short time. She had urinary and bowel incontinence however she has a history of this so it's unclear if this was related to a seizure. The seizure had subsided on arrival to the ED. CT head showed chronic white matter changes. Neurology was consulted and patient was started on Keppra 500mg  BID.   New onset seizure likely 2/2 dementia: Encephalopathy:  Today she was non-verbal, did not open eyes to command or painful stimuli, mild grimace with sternal rub. She did not have any jerking or shaking movements when I evaluated. Overnight she was still having frequent discharges however there may be some  improvement.   -Neurology following, appreciate recommendations -Keppra was increased to 1500 mg BID yesterday -Continue LTM EEG -Continue Vimpat 100 mg BID -continue phenytoin at 100 mg IV TID -Continue versed 1 mg q5 PRN -Blood cultures >>>NGTD -Palliative care consult, will discuss with care management given that patient is a [facility] patient  Hypertension: -On amlodipine 10 mg daily, losartan 50 mg daily, clonidine 0.3 mg patch, and carvedilol 40 mg daily at home -BP's have been stable, hold home medications and resume as needed  Right trochater wound: -Continue wound care   Hypokalemia: -K 3.7, repleating with maintenance fluids -BMP in AM  Dementia: Depression: Anxiety: -Continue home aricept and sertraline  FEN: D5 1/2 NS with K at 75cc/hr x10hr, replete lytes prn, NPO VTE ppx: Lovenox  Code Status: DNR    Dispo: Anticipated discharge is pending improvement vs hospice  Asencion Noble, MD 08/18/2018, 6:34 AM Pager: [phone]

## 2018-08-18 NOTE — Progress Notes (Signed)
Medicine attending: Clinical status and medical database reviewed with resident physician Dr Lonia Skinner and I concur with her evaluation and management plan. We appreciate ongoing assistance from neurology.  Plan to add a fourth anticonvulsant drug today.  Minimal ability to interact with deterioration compared with exam yesterday.  Not responding to verbal stimuli today or tracking with her eyes.  EEG still showing focal status epilepticus originating from the right frontal region. We will be in touch with her physicians at [facility] to continue goals of care discussions with the family.

## 2018-08-18 NOTE — Progress Notes (Signed)
Patient IV has become infiltrated. IV team called. Both nurses unsuccessful. Patient has IV seizure meds that are scheduled to be due. MD has been notified.

## 2018-08-18 NOTE — Progress Notes (Addendum)
NEURO HOSPITALIST PROGRESS NOTE   Subjective c/c: New onset seizures  PMH- Advanced dementia, HTN, HLD   Admitted- 08/15/2018  This AM patient laying in bed, NAD. Still using a non-rebreather and right nare nasal trumpet Sat 100%. No jerking movements noted upon entering room. Patient's eyes opened to name calling and she attempted to say "good morning". After some stimulation patient did appear to have a brief 10 sec seizure. Eyes did not deviate to the left. Her Sats dropped to 78% and left arm and leg twitching were noted. She was still able to track me around the room during the spell. Patient was seen overnight by Dr. Leonel Ramsay. "EEG still with frequent discharges, but maybe some improvement".   Exam: Vitals:   08/18/18 0415 08/18/18 0721  BP: (!) 165/72 (!) 155/75  Pulse: (!) 57 73  Resp: 20 18  Temp: 98.4 F (36.9 C) 98.5 F (36.9 C)  SpO2: 100% 100%    Physical Exam  Constitutional - frail appearing, abulic, non-verbal  HEENT-  Patient eyes midline. able cross midline and track Cardiovascular- S1-S2 audible, pulses palpable throughout   Lungs- clear anterior ; non-brebreather sats 100% Abdomen- soft  Extremities- moves spontaneously Musculoskeletal- generalized edema Skin-warm and dry,intact. Old bilateral hand burn scar tissue noted.    Neuro:  Mental Status: flat affect, did attempt to state "good morning", does not respond to commands. Opened eyes to name calling, and did grimace to sternal rub. Cranial Nerves: II: Does not blink to threat. PERRL III,IV, VI: Eyes are disconjugate. Does resist to eye lids being opened  V: VII: Corneals are intact Motor/Sensory:   grimaces on application of noxious stimuli. She does have other non verbal cues other than withdrawal such as head turning and widening of eyes  Reflexes: Hyperreflexic in BLE. BUE 2+ reflexes. Cerebellar: Does not perform    Medications:  Scheduled: . enoxaparin (LOVENOX) injection   40 mg Subcutaneous QHS  . phenytoin (DILANTIN) IV  100 mg Intravenous Q8H   Continuous: . dexrose 5 % and 0.45 % NaCl with KCl 30 mEq/L 75 mL/hr at 08/18/18 0314  . lacosamide (VIMPAT) IV Stopped (08/18/18 0500)  . levETIRAcetam 1,500 mg (08/18/18 0509)   WVP:[id number] **OR** acetaminophen, LORazepam, senna-docusate  Pertinent Labs/Diagnostics: 08/17/18 Phenytoin level is 17.7 Ammonia 15- normal  Lactic- normal 1.4 BG: 130 cl: 96 Resp panel normal without detection of bacteria or virus     CT brain w/o contrast  Stable since 2019 and largely normal for age non contrast CT appearance of the brain.  LTM EEG: 08/16/2018:  There is diffuse slowing. Intermittent focal rhythmic right frontal slowing with sharp waves lasting about 5 seconds each. These are not sustained and also not evolving. Overall impression is that she is no longer exhibiting electrographic status and this pattern could be seen in resolving status epilepticus.  LTM EEG 08/17/18: This was an abnormal continuous video EEG due to diffuse, right maximal slowing with frequent right frontal epileptiform discharges. There was some emergence of subtle subclinical seizures overnight, however these showed less sustained ictal patterns when compared to the patient's routine EEG on 08/16/18.    LTM EEG 08/18/2018: This wasan abnormal continuous video EEG due to focal status epilepticus in the right frontal region. Clinical seizures improved in the late afternoon with additional AED dosing, however interictal or electrographic ictal activity was present overnight in this region  without clear clinical signs.   MRI brain w/o contrast 08/16/18 1. Technically limited exam due to motion artifact. 2. No acute intracranial abnormality. 3. Mild chronic microvascular ischemic disease for age   Laurey Morale, MSN, NP-C [facility] [phone]   Assessment:   78 year old female with new onset seizures likely secondary to  dementia.  The episodes of behavioral arrest that the sister describes could have been partial seizures in the past, though this is unclear.   1. Clinical seizure activity was initially controlled following initiation of Keppra. Subsequent exam findings were suggestive of subtle clinical seizure activity and spot EEG showed correlate of repeat electrographic generalized seizures with maximum activity involving the right anterior temporal region, consistent with status epilepticus. Phenytoin was then started. Subsequent LTM EEG tracings showed improvement after the phenytoin load, but with some recurrent seizures with rhythmic focal slowing in the right frontal lobe. Vimpat was then started and overnight LTM EEG had shown some improvement, but frequent right frontal epileptiform discharges were noted, as well as subtle subclinical seizures overnight. Vimpat was then increased to 200 mg BID yesterday. Report for LTM EEG this morning documents focal status epilepticus in the right frontal region. Clinical seizures improved in the late afternoon with additional AED dosing (Vimpat), however interictal or electrographic ictal activity was present overnight in this region without clear clinical signs.  2. There is mild chronic microvascular ischemia noted on MRI.  3. 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    Recommendations: 1. Will need to continue LTM as she has continued seizure activity overnight.  2. Continue Keppra  1500 mg IV BID 3. Continue Vimpat at 200 mg IV BID 4. Continue phenytoin at 100 mg IV TID. Level was 17.7 yesterday.  5. Adding phenobarbital to her regimen today.  6. Due to her advanced age and relatively high likelihood of not being able to wean off vent, sedation with Versed or propofol with burst suppression protocol is not indicated. Will continue to titrate anticonvulsants using EEG guidance.  7. Palliative care consult; if seizures  continue  Electronically signed: Dr. Kerney Elbe

## 2018-08-18 NOTE — Progress Notes (Signed)
EEG maint complete. No skin breakdown. Will continue to monitor

## 2018-08-18 NOTE — Progress Notes (Signed)
Spoke with Gabriel Earing RN re PICC request.  Pt in background moaning loudly, and per RN is moving around, unable to hold still at this time.  RN states current PIV is working and able to infuse anti seizure meds.  Due to current moaning, moving and status epilepticus, RN notified will re assess status in the am.

## 2018-08-18 NOTE — Discharge Summary (Addendum)
Name: Amanda Roman MRN: [id number] DOB: 1941/04/21 78 y.o. PCP: [facility] [place]  Date of Admission: 08/15/2018  4:41 PM Date of Discharge: 08/20/18 Attending Physician: Sid Falcon, MD  Discharge Diagnosis: 1.  Acute encephalopathy secondary to status epilepticus 2.  Hypertension 3.  Hypokalemia  Discharge Medications: Allergies as of 08/20/2018      Reactions   Ace Inhibitors Swelling, Other (See Comments)   Angioedema   Penicillins Itching   Has patient had a PCN reaction causing immediate rash, facial/tongue/throat swelling, SOB or lightheadedness with hypotension: Yes Has patient had a PCN reaction causing severe rash involving mucus membranes or skin necrosis: No Has patient had a PCN reaction that required hospitalization No Has patient had a PCN reaction occurring within the last 10 years: No If all of the above answers are "NO", then may proceed with Cephalosporin use.   Acetaminophen-codeine Rash   Codeine Rash      Medication List    STOP taking these medications   carvedilol 25 MG tablet Commonly known as:  COREG   cloNIDine 0.3 MG tablet Commonly known as:  CATAPRES     TAKE these medications   amLODipine 10 MG tablet Commonly known as:  NORVASC Take 1 tablet (10 mg total) by mouth daily.   ARTHRITIS PAIN RELIEF RUB EX Apply 1 application topically 2 (two) times daily.   aspirin EC 81 MG tablet Take 1 tablet (81 mg total) by mouth daily.   CALCIUM 600 PO Take 600 mg by mouth daily.   carvedilol 40 MG 24 hr capsule Commonly known as:  COREG CR Take 40 mg by mouth daily.   CEROVITE SENIOR Tabs Take 1 tablet by mouth daily.   cloNIDine 0.3 mg/24hr patch Commonly known as:  CATAPRES - Dosed in mg/24 hr Place 0.3 mg onto the skin every Friday.   DOK PLUS 8.6-50 MG tablet Generic drug:  senna-docusate Take 2 tablets by mouth at bedtime.   donepezil 10 MG tablet Commonly known as:  ARICEPT Take 10 mg by  mouth at bedtime.   furosemide 20 MG tablet Commonly known as:  LASIX Take 1 tablet (20 mg total) by mouth daily.   haloperidol 2 MG/ML solution Commonly known as:  HALDOL Place 0.3 mLs (0.6 mg total) under the tongue every 4 (four) hours as needed for agitation (or delirium).   levETIRAcetam 750 MG tablet Commonly known as:  KEPPRA Take 2 tablets (1,500 mg total) by mouth 2 (two) times daily.   LORazepam 1 MG tablet Commonly known as:  ATIVAN Take 1 mg by mouth 2 (two) times daily. What changed:  Another medication with the same name was added. Make sure you understand how and when to take each.   LORazepam 2 MG/ML concentrated solution Commonly known as:  ATIVAN Place 0.5 mLs (1 mg total) under the tongue every 4 (four) hours as needed for anxiety. What changed:  You were already taking a medication with the same name, and this prescription was added. Make sure you understand how and when to take each.   losartan 50 MG tablet Commonly known as:  COZAAR Take 50 mg by mouth daily.   phenytoin 100 MG ER capsule Commonly known as:  DILANTIN Take 1 capsule (100 mg total) by mouth 3 (three) times daily.   potassium chloride SA 20 MEQ tablet Commonly known as:  K-DUR,KLOR-CON Take 1 tablet (20 mEq total) by mouth every morning.   sertraline 100 MG tablet  Commonly known as:  ZOLOFT Take 200 mg by mouth daily. What changed:  Another medication with the same name was removed. Continue taking this medication, and follow the directions you see here.       Disposition and follow-up:   Amanda Roman was discharged from [facility] in Serious condition.  At the hospital follow up visit please address:  1.  Family would like a comfort care approach. She has a history of status epilepticus which was persistent despite 4 AEDs, these were discontinued and she was given as needed Ativan. She was continued on Phenytoin 100 mg TID PO and Keppra 1,500 mg BID PO at  discharge to help prevent tonic clonic seizures.   She was only restarted on clonidine patch, please restart home antihypertensives as needed.   2.  Labs / imaging needed at time of follow-up: None  3.  Pending labs/ test needing follow-up: None  Follow-up Appointments:   Hospital Course by problem list: 1.  Acute encephalopathy secondary to status epilepticus: This is a 78 year old female with a history of dementia, depression, nonverbal at baseline requiring 24 hours assistance, who presented from [facility] with loss of consciousness and seizure-like activities.  She received Versed in the EMS however continued to have multiple seizures, she subsided on arrival to the ED.  She was noted to have a fever to 101 and there is still initial concern for possible meningitis and she started on empiric antibiotics.  A lumbar puncture was discussed however family declined. She was started on Keppra and EEG was done that showed status epilepticus.  She was loaded with phenytoin and started on Keppra and Dilantin.  CT head was done that showed no acute findings or source of her new onset seizures.  Discussed the possible need for intubation and transfer to ICU however per the family's wishes this was not done.  She continued to have intermittent seizures and phenobarbital was added.  She was on 4 AEDs and she continued to have intermittent seizures throughout the hospital stay with intermittent improvements in her mental status.  After discussion with the family and the patient's PCP it was decided that she would go to a more comfort care route and comfort care orders were placed. Spoke with the neurologist who recommended continuing dilantin and keppra to help prevent tonic-clonic seizures. Patient was transferred to [facility] place.  2.  Hypertension: She is on amlodipine 10 mg daily, losartan 50 mg daily, clonidine 0.3 mg patch, and carvedilol 40 mg daily at home.  These medications were held due to blood  pressures being stable.  Blood pressure did start to increase so clonidine patch was restarted.  3.  Hypokalemia: She had occasional episodes of hypokalemia which was repleted.  Discharge Vitals:   BP (!) 158/93 (BP Location: Left Arm)   Pulse 92   Temp 99 F (37.2 C) (Oral)   Resp 20   Ht 5' (1.524 m)   Wt 68 kg   SpO2 95%   BMI 29.29 kg/m   Pertinent Labs, Studies, and Procedures:  CBC Latest Ref Rng & Units 08/16/2018 08/15/2018 11/06/2017  WBC 4.0 - 10.5 K/uL 7.7 5.9 6.4  Hemoglobin 12.0 - 15.0 g/dL 11.7(L) 9.8(L) 11.2(L)  Hematocrit 36.0 - 46.0 % 35.5(L) 30.9(L) 33.6(L)  Platelets 150 - 400 K/uL 208 186 431(H)   BMP Latest Ref Rng & Units 08/19/2018 08/18/2018 08/17/2018  Glucose 70 - 99 mg/dL 88 111(H) 130(H)  BUN 8 - 23 mg/dL  7(L) <5(L) <5(L)  Creatinine 0.44 - 1.00 mg/dL 0.65 0.56 0.58  BUN/Creat Ratio 12 - 28 - - -  Sodium 135 - 145 mmol/L 143 143 140  Potassium 3.5 - 5.1 mmol/L 3.5 3.7 3.7  Chloride 98 - 111 mmol/L 97(L) 99 96(L)  CO2 22 - 32 mmol/L 32 36(H) 33(H)  Calcium 8.9 - 10.3 mg/dL 9.5 9.4 9.5   08/15/2018 CT head: IMPRESSION: Stable since 2019 and largely normal for age non contrast CT appearance of the brain.  08/16/18 MRI head:  IMPRESSION: 1. Technically limited exam due to motion artifact. 2. No acute intracranial abnormality. 3. Mild chronic microvascular ischemic disease for age.  Discharge Instructions: Discharge Instructions    Call MD for:  difficulty breathing, headache or visual disturbances   Complete by:  As directed    Call MD for:  extreme fatigue   Complete by:  As directed    Call MD for:  hives   Complete by:  As directed    Call MD for:  persistant dizziness or light-headedness   Complete by:  As directed    Call MD for:  persistant nausea and vomiting   Complete by:  As directed    Call MD for:  redness, tenderness, or signs of infection (pain, swelling, redness, odor or green/yellow discharge around incision site)   Complete by:   As directed    Call MD for:  severe uncontrolled pain   Complete by:  As directed    Call MD for:  temperature >100.4   Complete by:  As directed    Diet - low sodium heart healthy   Complete by:  As directed    Increase activity slowly   Complete by:  As directed       Signed: Asencion Noble, MD 08/20/2018, 1:59 PM   Pager: [phone]

## 2018-08-18 NOTE — Progress Notes (Deleted)
Patient requested to not have any vital signs or be disrupted during the night. MD has been notified and placed order. Sign placed on door.

## 2018-08-18 NOTE — Procedures (Signed)
LTM-EEG Report  HISTORY: Continuous video-EEG monitoring performed for 78 year old with AMS and NCSE. ACQUISITION: International 10-20 system for electrode placement; 18 channels with additional eyes linked to ipsilateral ears and EKG. Additional T1-T2 electrodes were used. Continuous video recording obtained.   EEG NUMBER:  MEDICATIONS:  Day 2:  See EMR  DAY #2: from 0730 08/17/18 to 0730 08/18/18   BACKGROUND: An overall medium voltage continuous recording with some spontaneous variability and reactivity. Waking background consisted of a medium voltage 4-7 Hz pattern bilaterally with increased 1-3Hz  activity over the right hemisphere. State changes were present, however no clear sleep architecture was seen.  EPILEPTIFORM/PERIODIC ACTIVITY: Frequent runs of rhythmic right frontal sharp waves throughout the early portion of the record, improving after 1900 with increased treatment, but returning overnight with rhythmic 1-2hz  spikes. SEIZURES: Clinical seizures with left arm and head clonus, associated with right frontal rhythmic sharp waves were present in the afternoon and resolved with IV lacosamide and lorazepam. Some electrographic seizure were present overnight with rhythmic right frontal 1-2hz  spikes with subtle evolution in frequency to rhythmic 4hz  over 30 seconds.  EVENTS: Clinical seizures as above  EKG: no significant arrhythmia  SUMMARY: This was an abnormal continuous video EEG due to focal status epilepticus in the right frontal region. Clinical seizures improved in the late afternoon with additional AED dosing, however interictal or electrographic ictal activity was present overnight in this region without clear clinical signs.

## 2018-08-19 DIAGNOSIS — R011 Cardiac murmur, unspecified: Secondary | ICD-10-CM

## 2018-08-19 DIAGNOSIS — R4182 Altered mental status, unspecified: Secondary | ICD-10-CM

## 2018-08-19 LAB — BASIC METABOLIC PANEL
Anion gap: 14 (ref 5–15)
BUN: 7 mg/dL — ABNORMAL LOW (ref 8–23)
CO2: 32 mmol/L (ref 22–32)
CREATININE: 0.65 mg/dL (ref 0.44–1.00)
Calcium: 9.5 mg/dL (ref 8.9–10.3)
Chloride: 97 mmol/L — ABNORMAL LOW (ref 98–111)
GFR calc Af Amer: >60 mL/min (ref >60)
GFR calc non Af Amer: >60 mL/min (ref >60)
Glucose, Bld: 88 mg/dL (ref 70–99)
Potassium: 3.5 mmol/L (ref 3.5–5.1)
Sodium: 143 mmol/L (ref 135–145)

## 2018-08-19 LAB — ALBUMIN: Albumin: 2.6 g/dL — ABNORMAL LOW (ref 3.5–5.0)

## 2018-08-19 LAB — PHOSPHORUS: Phosphorus: 3.2 mg/dL (ref 2.5–4.6)

## 2018-08-19 LAB — PHENYTOIN LEVEL, TOTAL: Phenytoin Lvl: 15.2 ug/mL (ref 10.0–20.0)

## 2018-08-19 LAB — MAGNESIUM: Magnesium: 1.6 mg/dL — ABNORMAL LOW (ref 1.7–2.4)

## 2018-08-19 LAB — PHENOBARBITAL LEVEL: Phenobarbital: <5 ug/mL — ABNORMAL LOW (ref 15.0–30.0)

## 2018-08-19 MED ORDER — ACETAMINOPHEN 325 MG PO TABS: 650.0000 mg | ORAL_TABLET | Freq: Four times a day (QID) | ORAL | Status: DC | PRN

## 2018-08-19 MED ORDER — GLYCOPYRROLATE 0.2 MG/ML IJ SOLN: 0.2000 mg | INTRAMUSCULAR | Status: DC | PRN

## 2018-08-19 MED ORDER — LORAZEPAM 2 MG/ML IJ SOLN: 1.0000 mg | INTRAMUSCULAR | Status: DC | PRN

## 2018-08-19 MED ORDER — BIOTENE DRY MOUTH MT LIQD: 15.0000 mL | OROMUCOSAL | Status: DC | PRN

## 2018-08-19 MED ORDER — LORAZEPAM 2 MG/ML PO CONC
1.0000 mg | ORAL | Status: DC | PRN
  Administered 2018-08-19 – 2018-08-20 (×2): 1 mg via SUBLINGUAL
  Filled 2018-08-19 (×2): qty 1

## 2018-08-19 MED ORDER — ONDANSETRON HCL 4 MG/2ML IJ SOLN: 4.0000 mg | Freq: Four times a day (QID) | INTRAMUSCULAR | Status: DC | PRN

## 2018-08-19 MED ORDER — LORAZEPAM 1 MG PO TABS
1.0000 mg | ORAL_TABLET | ORAL | Status: DC | PRN
  Administered 2018-08-20: 1 mg via ORAL
  Filled 2018-08-19: qty 1

## 2018-08-19 MED ORDER — GLYCOPYRROLATE 1 MG PO TABS
1.0000 mg | ORAL_TABLET | ORAL | Status: DC | PRN
  Filled 2018-08-19: qty 1

## 2018-08-19 MED ORDER — HALOPERIDOL LACTATE 5 MG/ML IJ SOLN: 0.5000 mg | INTRAMUSCULAR | Status: DC | PRN

## 2018-08-19 MED ORDER — ONDANSETRON 4 MG PO TBDP: 4.0000 mg | ORAL_TABLET | Freq: Four times a day (QID) | ORAL | Status: DC | PRN

## 2018-08-19 MED ORDER — HALOPERIDOL 0.5 MG PO TABS
0.5000 mg | ORAL_TABLET | ORAL | Status: DC | PRN
  Filled 2018-08-19: qty 1

## 2018-08-19 MED ORDER — POLYVINYL ALCOHOL 1.4 % OP SOLN
1.0000 [drp] | Freq: Four times a day (QID) | OPHTHALMIC | Status: DC | PRN
  Filled 2018-08-19: qty 15

## 2018-08-19 MED ORDER — CLONIDINE HCL 0.3 MG/24HR TD PTWK
0.3000 mg | MEDICATED_PATCH | TRANSDERMAL | Status: DC
  Administered 2018-08-19: 0.3 mg via TRANSDERMAL
  Filled 2018-08-19: qty 1

## 2018-08-19 MED ORDER — ACETAMINOPHEN 650 MG RE SUPP: 650.0000 mg | Freq: Four times a day (QID) | RECTAL | Status: DC | PRN

## 2018-08-19 MED ORDER — HALOPERIDOL LACTATE 2 MG/ML PO CONC
0.5000 mg | ORAL | Status: DC | PRN
  Administered 2018-08-19: 0.5 mg via SUBLINGUAL
  Filled 2018-08-19 (×2): qty 0.3

## 2018-08-19 NOTE — Progress Notes (Addendum)
[facility]  Referral received from [place], for family request of residential hospice at [facility]. Patient and chart under review at this time, bed availability has not been confirmed.  Spoke with [name] (sister and [facility]), to confirm interest and answer any questions.  NOK and RN Case Manager will be updated once bed availability has been determined.  **Addendum 2/10 440pm,  [facility] will have a bed to offer Amanda Roman on 2/11.  Left msg for NOK to advise her we would need to meet in am to complete necessary paperwork.  RN Case manager aware.  Thank you, Venia Carbon BSN, RN [facility] (listed in [place]) [phone]

## 2018-08-19 NOTE — Progress Notes (Addendum)
NEUROLOGY PROGRESS NOTE  Subjective: Patient remains drowsy but is able to follow simple commands.  She does know her name.  Exam is very difficult as patient continues to drift asleep if not continuously stimulated.  Exam: Vitals:   08/19/18 0333 08/19/18 0737  BP: 134/75 (!) 158/71  Pulse: 80 85  Resp: 18 18  Temp: 98.5 F (36.9 C)   SpO2: 100% 100%    Physical Exam   HEENT-  Normocephalic, no lesions, without obvious abnormality.  Normal external eye and conjunctiva.  Musculoskeletal-no joint tenderness, deformity or swelling Skin-warm and dry, no hyperpigmentation, vitiligo, or suspicious lesions    Neuro:  Mental Status: Alert, oriented to name.  Speech hypophonic without significant aphasia but does have dysarthria.  Attempts to follow simple one-step commands.  She is able to raise her arms and attempts to raise her leg.   Cranial Nerves: II: Links to threat III,IV, VI: ptosis not present, extra-ocular motions intact bilaterally pupils equal, round, reactive to light and accommodation V,VII: Right facial droop at rest, VIII: hearing normal bilaterally  Motor: On her right arm she is able to lift antigravity, on her left arm she is able to lift her forearm antigravity.  Left leg she is able to move it side to side which would be a 2/5 in addition dorsiflex and plantarflex with no difficulty.  On her right leg is unable left antigravity and shows minimal movement however she is able to wiggle her toes. Sensory: Pinprick and light touch intact throughout, bilaterally Cerebellar: Not perform     Medications:  Scheduled: . enoxaparin (LOVENOX) injection  40 mg Subcutaneous QHS  . PHENObarbital  65 mg Intravenous BID  . phenytoin (DILANTIN) IV  100 mg Intravenous Q8H   Continuous: . lacosamide (VIMPAT) IV 200 mg (08/18/18 2237)  . levETIRAcetam 1,500 mg (08/19/18 0554)   JJO:[id number] **OR** acetaminophen, LORazepam, senna-docusate  Pertinent  Labs/Diagnostics: DAY #3 LTM-from07302/9/20to 0730 08/19/18  BACKGROUND: An overall medium voltage continuous recording withsomespontaneous variability and reactivity. Waking background consisted of a medium voltage4-7Hz pattern bilaterally with increased 1-3Hz  activity over the right hemisphere. State changes were present with somesleep architecturewas seen.  EPILEPTIFORM/PERIODIC ACTIVITY: Runs of rhythmic right frontalsharp waves throughout the early portion of the record with ictal evolution. These have not shown any clear evolution since around 2200 but have still been present in short runs.  SEIZURES:Some electrographic seizures arising from rhythmic right frontal spikes, 1-2Hz  then evolving to 5hz , occurred during the afternoon and early evening. These had no clinical correlate. There was some improvement in these seizures in the late evening with some improved waking patterns observed. Short runs of rhythmic right frontal sharp waves have still been seen since but without ictal evolution.  EVENTS:none  EKG: no significant arrhythmia  SUMMARY: This wasan abnormal continuous video EEG due tofocal status epilepticus in the right frontal region. Clinical seizures improved in the late afternoon with additional AED dosing, however interictal or electrographic ictal activity was present overnight in this region without clear clinical signs.  MRI brain without contrast on 08/16/2018 was technically limited but did not show any intracranial abnormalities.  [place] Ekg Site Rite  Result Date: 08/18/2018 If [facility] image not attached, placement could not be confirmed due to current cardiac rhythm.    Etta Quill PA-C [facility] [phone]   Assessment: 78 year old female with new onset seizure likely secondary to dementia.  Patient is on LTM and continues to have right-sided seizures.  Currently on 4 antiepileptic medications including Vimpat,  Keppra, Dilantin,  phenobarbital.  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  As last level was ordered 2 days ago we will order new level today - We will place order for pharmacy to dose Dilantin and phenobarbital    08/19/2018, 8:49 AM as last level was 2 days ago   NEUROHOSPITALIST ADDENDUM Performed a face to face diagnostic evaluation.   I have reviewed the contents of history and physical exam as documented by PA/ARNP/Resident and agree with above documentation.  I have discussed and formulated the above plan as documented. Edits to the note have been made as needed.   Patient with partial status epilepticus.  She is clinically improved today and answering questions and following commands.  EEG still shows intermittent seizures, however she is already maxed out on 4 antiepileptics.  I do not think she needs further long-term EEG monitoring and will continue to follow her her clinically.  Ordered levels of phenytoin and phenobarbital   Karena Addison Laiylah Roettger MD [facility] [phone]   If 7pm to 7am, please call on call as listed on AMION.

## 2018-08-19 NOTE — Progress Notes (Signed)
  Date: 08/19/2018  Patient name: Amanda Roman  Medical record number: [id number]  Date of birth: 04/10/1941   I have seen and evaluated this patient and I have discussed the plan of care with the house staff. Please see Dr. Dorothyann Peng note for complete details. I concur with her findings and plan.  Further planning based on patient response to seizure medication and further discussion with family.  She is not responding well and may need more of a comfort care approach given desire for DNR status.   Sid Falcon, MD 08/19/2018, 12:56 PM

## 2018-08-19 NOTE — Progress Notes (Signed)
LTM discontinued per Dr Lorraine Lax. No skin breakdown was seen.

## 2018-08-19 NOTE — Progress Notes (Signed)
   Subjective: Amanda Roman was resting in bed, NAD. She would not open her eyes to voice nor to sternal rub. No witnessed jerking motions. No family at bedside. Per neurology and nurse she was more alert this morning and was able to follow simple commands.  Spoke with the sister and brother in law yesterday about the lack of progress that Amanda Roman is having and where to go from here. They reported that they would like to discuss with Dr. Jimmye Norman before making any further decisions.   Objective:  Vital signs in last 24 hours: Vitals:   08/18/18 1634 08/18/18 1919 08/18/18 2300 08/19/18 0333  BP: (!) 170/78 (!) 182/79 (!) 147/82 134/75  Pulse: 75 62 68 80  Resp: 18 18 18 18   Temp: 98.5 F (36.9 C) 99.5 F (37.5 C) 99.2 F (37.3 C) 98.5 F (36.9 C)  TempSrc: Axillary Axillary Axillary Axillary  SpO2: 100% 100% 100% 100%  Weight:      Height:        General: Frail appearing female, NAD, not responding to voice Cardiac: RRR, systolic murmur Pulmonary: CTABL, no wheezing Abdomen: Soft, no guarding, +BS Neuro: Not following commands, no eye opening to voice or noxious stimuli  Assessment/Plan:  Active Problems:   Seizure (HCC)   Status epilepticus (HCC)   HTN (hypertension), benign   Vascular dementia, uncomplicated ([place])  This is a 78 year old female with a history of dementia, depression, non-verbal at baseline requiring 24 hour assistance with ADLs who [facility] with LOC and seizure like activities. She received IV versed x2. She also became apneic and required bagging for a short time. She had urinary and bowel incontinence however she has a history of thisso it's unclear if this was related to a seizure. The seizure had subsided on arrival to the ED. CT head showed chronic white matter changes. Neurology was consulted and patient was started on Keppra 500mg  BID.  New onset seizure likely 2/2 dementia: Encephalopathy:  -Today she is still not  responding to voice or noxious stimuli. She is continuing to have simple partial seizures overnight. She is currently on 4 AED medications .  -Neurology following, appreciate recommendations.  -Continue Vimpat 100 mg BID -Continue phenytoin 100 mg IV TID Continue Keppra 1500 mg BID -Continue phenobarbital 100 mg IV TID -Blood cultures > NGTD  Hypertension: -Patient has been getting more hypertensive. She is on amlodipine 10 mg daily, losartan 50 mg daily, clonidine 0.3 mg patch, and carvedilol 40 mg daily at home.  -Will restart clonidine patch today   Right trochater wound: -Continue wound care  Hypokalemia: -K was 3.5 today.  -Follow BMP  FEN: D5 1/2 NS with K at 75 cc/hr, replete lytes prn, NPO VTE ppx: Lovenox  Code Status: DNR    Dispo: Anticipated discharge is pending placement  Asencion Noble, MD 08/19/2018, 6:30 AM Pager: [phone]

## 2018-08-19 NOTE — Procedures (Signed)
LTM-EEG Report  HISTORY: Continuous video-EEG monitoring performed for65year old with AMS and NCSE. ACQUISITION: International 10-20 system for electrode placement; 18 channels with additional eyes linked to ipsilateral ears and EKG. Additional T1-T2 electrodes were used. Continuous video recording obtained.   EEG NUMBER:  MEDICATIONS:  Day 3:See EMR  DAY #3: [id number] 2/9/20to 0730 08/19/18  BACKGROUND: An overall medium voltage continuous recording withsomespontaneous variability and reactivity. Waking background consisted of a medium voltage4-7Hz pattern bilaterally with increased 1-3Hz  activity over the right hemisphere. State changes were present with somesleep architecturewas seen.  EPILEPTIFORM/PERIODIC ACTIVITY: Runs of rhythmic right frontal sharp waves throughout the early portion of the record with ictal evolution. These have not shown any clear evolution since around 2200 but have still been present in short runs.  SEIZURES:Some electrographic seizures arising from rhythmic right frontal spikes, 1-2Hz  then evolving to 5hz , occurred during the afternoon and early evening. These had no clinical correlate. There was some improvement in these seizures in the late evening with some improved waking patterns observed. Short runs of rhythmic right frontal sharp waves have still been seen since but without ictal evolution.  EVENTS:none  EKG: no significant arrhythmia  SUMMARY: This wasan abnormal continuous video EEG due to focal status epilepticus in the right frontal region. Clinical seizures improved in the late afternoon with additional AED dosing, however interictal or electrographic ictal activity was present overnight in this region without clear clinical signs.

## 2018-08-19 NOTE — Care Management Note (Signed)
Case Management Note  Patient Details  Name: ROMELLE MULDOON MRN: [id number] Date of Birth: June 21, 1941  Subjective/Objective:      Pt admitted with seizure activity. She has been at [facility]. [facility] also [facility].               Action/Plan: CM received a phone call from Dr Sherry Ruffing that patient on multiple anti-seizure meds but continues to have seizure activity. Family and MD agreeing on residential hospice. CM spoke with SW: Webb Silversmith at [facility] and they are also in agreement. CM called Anderson Malta with [facility] (family's choice) and made a referral for [employer] [facility] [employer]. CSW updated. CM following.  Expected Discharge Date:                  Expected Discharge Plan:  [place]  In-House Referral:  Clinical Social Work  Discharge planning Services  CM Consult  Post Acute Care Choice:    Choice offered to:     DME Arranged:    DME Agency:     HH Arranged:    [place] Agency:     Status of Service:  In process, will continue to follow  If discussed at Long Length of Stay Meetings, dates discussed:    Additional Comments:  Pollie Friar, RN 08/19/2018, 2:37 PM

## 2018-08-19 NOTE — Progress Notes (Addendum)
Spoke with Dr. Jimmye Norman who reported that the sister in law is getting overwhelmed and they wanted to discuss with her PCP to guide them in their decision. The patient had quickly advancing dementia, limited mobility, and limited quality of life prior to admission. She has been updated on the patients progress during her hospitalization, she is on 4 AEDs now and is still having intermittent seizures overnight. She occassionally will interact with you and mouth words however this has been limited. Overall she has a poor prognosis. The family is aware of this and wanted to talk with Dr. Jimmye Norman today. Dr. Jimmye Norman thinks that hospice would be a good option given the families wishes and had discussed [place] with the family before and they were agreeable to it. She reported that she will contact the family and provide her recommendations. I will contact the family to follow up on their wishes. At this time we will continue all current treatment at this time and consult case management to assist with [facility] placement.   Spoke with Ms. Wynetta Emery, patients sister. She stated that she had spoken with Dr. Jimmye Norman about transfer to [facility]. She stated that she had grieved and has come closer to acceptance. She reported that she would like her sister to be comfortable and agreed with transfer to [facility]. We will discontinue her AEDs at this time and place comfort care orders. Care management is assisting with transfer to [facility].

## 2018-08-19 NOTE — Progress Notes (Signed)
IV access is leaking presently and infiltrated; MD to paged; patient has no IV access presently; awaiting f/u on PICC line orders from MD; instructed to wait.

## 2018-08-19 NOTE — Plan of Care (Signed)
  Problem: Skin Integrity: Goal: Risk for impaired skin integrity will decrease Outcome: Progressing   Problem: Clinical Measurements: Goal: Respiratory complications will improve Outcome: Not Progressing   Problem: Clinical Measurements: Goal: Ability to maintain clinical measurements within normal limits will improve Outcome: Progressing

## 2018-08-19 NOTE — Progress Notes (Signed)
Patient restless, trying to pull herself to the side rails; calling for her sister; patient repositioned and comforted; remained anxious and restless; prn ativan given for anxiety; safety maintained; continue to monitor.

## 2018-08-20 DIAGNOSIS — Z880 Allergy status to penicillin: Secondary | ICD-10-CM

## 2018-08-20 DIAGNOSIS — Z885 Allergy status to narcotic agent status: Secondary | ICD-10-CM

## 2018-08-20 DIAGNOSIS — R419 Unspecified symptoms and signs involving cognitive functions and awareness: Secondary | ICD-10-CM

## 2018-08-20 DIAGNOSIS — R451 Restlessness and agitation: Secondary | ICD-10-CM

## 2018-08-20 DIAGNOSIS — Z888 Allergy status to other drugs, medicaments and biological substances status: Secondary | ICD-10-CM

## 2018-08-20 LAB — CULTURE, BLOOD (ROUTINE X 2)
Culture: NO GROWTH
Culture: NO GROWTH
Special Requests: ADEQUATE

## 2018-08-20 MED ORDER — PHENYTOIN SODIUM EXTENDED 100 MG PO CAPS: 100.0000 mg | ORAL_CAPSULE | Freq: Three times a day (TID) | ORAL | 0 refills | Status: DC

## 2018-08-20 MED ORDER — LEVETIRACETAM 750 MG PO TABS: 1500.0000 mg | ORAL_TABLET | Freq: Two times a day (BID) | ORAL | 0 refills | Status: AC

## 2018-08-20 MED ORDER — HALOPERIDOL LACTATE 2 MG/ML PO CONC: 0.6000 mg | ORAL | 0 refills | Status: DC | PRN

## 2018-08-20 MED ORDER — LORAZEPAM 2 MG/ML PO CONC: 1.0000 mg | ORAL | 0 refills | Status: DC | PRN

## 2018-08-20 NOTE — Progress Notes (Signed)
   Subjective: Ms. Leggitt was resting comfortably in bed.  She was more alert today and was able totrack,.  She was unable to answer questions and remains nonverbal.  No family at bedside.  We told her that we are working to transfer her over to begin place to make sure that she is comfortable.  Overngiht she was very restless and anxious. She was given ativan. This morning she was calm and appeared comfortable.   Objective:  Vital signs in last 24 hours: Vitals:   08/19/18 0737 08/19/18 1139 08/19/18 2001 08/20/18 0511  BP: (!) 158/71 (!) 163/84 (!) 171/72 140/75  Pulse: 85 91 85 66  Resp: 18  20 16   Temp:   99.8 F (37.7 C) 98.5 F (36.9 C)  TempSrc:   Oral Oral  SpO2: 100% 94% 96% 97%  Weight:      Height:        General: Awake, able to track, no acute distress Cardiac: Systolic murmur, regular rate and rhythm Pulmonary: CTA BL, no wheezing or rhonchi Abdomen: Soft, nontender, normoactive bowel sounds Neuro: Tracks, awake, not following commands    Assessment/Plan:  Active Problems:   Seizure (HCC)   Status epilepticus (HCC)   HTN (hypertension), benign   Vascular dementia, uncomplicated (HCC)   Altered mental status   This is a 78 year old female with a history of dementia, depression, non-verbal at baseline requiring 24 hour assistance with ADLs who presented from [facility] with LOC and seizure like activities. She received IV versed x2. She also became apneic and required bagging for a short time. She had urinary and bowel incontinence however she has a history of thisso it's unclear if this was related to a seizure. The seizure had subsided on arrival to the ED. CT head showed chronic white matter changes. Neurology was consulted and patient was started on Keppra 500mg  BID.  New onset seizurelikely 2/2 dementia: Encephalopathy: -All her AED medications were discontinued yesterday and she was placed on comfort care.  She was having difficulty with IV  access and after discussion with Dr. Jimmye Norman and the patient's sister we decided to not obtain any access and provide full comfort care.  -Ativan PRN for seizures and comfort -Haldol PRN for agitation or delerium -Comfort care orders in -Transferring to [address] place today, appreciate case managements assistance with this  Hypertension: -She is on amlodipine 10 mg daily, losartan 50 mg daily, clonidine 0.3 mg patch, and carvedilol 40 mg daily at home.  -Continue clonidine patch  FEN: No fluids, replete lytes prn, NPO VTE ppx: Lovenox  Code Status: DNR    Dispo: Anticipated discharge in approximately today.   Asencion Noble, MD 08/20/2018, 6:27 AM Pager: [phone]

## 2018-08-20 NOTE — Progress Notes (Signed)
[facility]  Completed paper work with sister for [employer] transfer today. Discharge summary has been sent to [facility].   RN please call report to [phone].  Thank you,  Erling Conte, LCSW [phone]

## 2018-08-20 NOTE — Care Management Important Message (Signed)
Important Message  Patient Details  Name: Amanda Roman MRN: [id number] Date of Birth: 02-16-1941   Medicare Important Message Given:  Yes    Orbie Pyo 08/20/2018, 3:34 PM

## 2018-08-20 NOTE — Progress Notes (Signed)
Report called to Lattie Haw at [facility].

## 2018-08-20 NOTE — Progress Notes (Signed)
  Date: 08/20/2018  Patient name: Amanda Roman  Medical record number: [id number]  Date of birth: [birth year]   I have seen and evaluated this patient and I have discussed the plan of care with the house staff. Please see Dr. Dorothyann Peng note for complete details. I concur with her findings and plan.  Transition to inpatient hospice today.   Sid Falcon, MD 08/20/2018, 4:48 PM

## 2018-08-20 NOTE — Progress Notes (Signed)
Discharge to: [place] Anticipated discharge date: 08/20/18 Transportation by: [facility]  Report #: [phone]  Hayes signing off.  Laveda Abbe LCSW [phone]

## 2018-09-02 ENCOUNTER — Other Ambulatory Visit: Payer: Self-pay | Admitting: Internal Medicine

## 2018-09-02 ENCOUNTER — Ambulatory Visit: Admission: RE | Admit: 2018-09-02 | Payer: Medicare (Managed Care) | Source: Ambulatory Visit

## 2018-09-02 DIAGNOSIS — R0602 Shortness of breath: Secondary | ICD-10-CM

## 2018-09-02 DIAGNOSIS — J9621 Acute and chronic respiratory failure with hypoxia: Secondary | ICD-10-CM

## 2019-08-30 ENCOUNTER — Emergency Department (HOSPITAL_COMMUNITY): Payer: Medicare (Managed Care)

## 2019-08-30 ENCOUNTER — Inpatient Hospital Stay (HOSPITAL_COMMUNITY)
Admission: EM | Admit: 2019-08-30 | Discharge: 2019-09-09 | DRG: 100 | Disposition: A | Discharge status: Skilled Nursing Facility | Payer: Medicare (Managed Care) | Source: Skilled Nursing Facility | Attending: Internal Medicine | Admitting: Internal Medicine

## 2019-08-30 DIAGNOSIS — G92 Toxic encephalopathy: Secondary | ICD-10-CM | POA: Diagnosis present

## 2019-08-30 DIAGNOSIS — T4275XA Adverse effect of unspecified antiepileptic and sedative-hypnotic drugs, initial encounter: Secondary | ICD-10-CM | POA: Diagnosis present

## 2019-08-30 DIAGNOSIS — Z20822 Contact with and (suspected) exposure to covid-19: Secondary | ICD-10-CM | POA: Diagnosis present

## 2019-08-30 DIAGNOSIS — E874 Mixed disorder of acid-base balance: Secondary | ICD-10-CM | POA: Diagnosis present

## 2019-08-30 DIAGNOSIS — R4020 Unspecified coma: Secondary | ICD-10-CM | POA: Diagnosis present

## 2019-08-30 DIAGNOSIS — F329 Major depressive disorder, single episode, unspecified: Secondary | ICD-10-CM | POA: Diagnosis present

## 2019-08-30 DIAGNOSIS — E785 Hyperlipidemia, unspecified: Secondary | ICD-10-CM | POA: Diagnosis present

## 2019-08-30 DIAGNOSIS — I10 Essential (primary) hypertension: Secondary | ICD-10-CM | POA: Diagnosis present

## 2019-08-30 DIAGNOSIS — Z7189 Other specified counseling: Secondary | ICD-10-CM

## 2019-08-30 DIAGNOSIS — Z515 Encounter for palliative care: Secondary | ICD-10-CM | POA: Diagnosis present

## 2019-08-30 DIAGNOSIS — Z79899 Other long term (current) drug therapy: Secondary | ICD-10-CM

## 2019-08-30 DIAGNOSIS — M8580 Other specified disorders of bone density and structure, unspecified site: Secondary | ICD-10-CM | POA: Diagnosis present

## 2019-08-30 DIAGNOSIS — R64 Cachexia: Secondary | ICD-10-CM | POA: Diagnosis present

## 2019-08-30 DIAGNOSIS — G9608 Other cranial cerebrospinal fluid leak: Secondary | ICD-10-CM | POA: Diagnosis present

## 2019-08-30 DIAGNOSIS — G40901 Epilepsy, unspecified, not intractable, with status epilepticus: Principal | ICD-10-CM | POA: Diagnosis present

## 2019-08-30 DIAGNOSIS — Z833 Family history of diabetes mellitus: Secondary | ICD-10-CM | POA: Diagnosis not present

## 2019-08-30 DIAGNOSIS — F0150 Vascular dementia without behavioral disturbance: Secondary | ICD-10-CM | POA: Diagnosis present

## 2019-08-30 DIAGNOSIS — Z791 Long term (current) use of non-steroidal anti-inflammatories (NSAID): Secondary | ICD-10-CM | POA: Diagnosis not present

## 2019-08-30 DIAGNOSIS — E876 Hypokalemia: Secondary | ICD-10-CM | POA: Diagnosis present

## 2019-08-30 DIAGNOSIS — H269 Unspecified cataract: Secondary | ICD-10-CM | POA: Diagnosis present

## 2019-08-30 DIAGNOSIS — R569 Unspecified convulsions: Secondary | ICD-10-CM | POA: Diagnosis not present

## 2019-08-30 DIAGNOSIS — Z66 Do not resuscitate: Secondary | ICD-10-CM | POA: Diagnosis present

## 2019-08-30 DIAGNOSIS — Z79891 Long term (current) use of opiate analgesic: Secondary | ICD-10-CM

## 2019-08-30 DIAGNOSIS — I6203 Nontraumatic chronic subdural hemorrhage: Secondary | ICD-10-CM | POA: Diagnosis present

## 2019-08-30 DIAGNOSIS — I872 Venous insufficiency (chronic) (peripheral): Secondary | ICD-10-CM

## 2019-08-30 DIAGNOSIS — R4182 Altered mental status, unspecified: Secondary | ICD-10-CM

## 2019-08-30 DIAGNOSIS — Z87891 Personal history of nicotine dependence: Secondary | ICD-10-CM | POA: Diagnosis not present

## 2019-08-30 DIAGNOSIS — I959 Hypotension, unspecified: Secondary | ICD-10-CM | POA: Diagnosis present

## 2019-08-30 DIAGNOSIS — L8990 Pressure ulcer of unspecified site, unspecified stage: Secondary | ICD-10-CM | POA: Diagnosis present

## 2019-08-30 DIAGNOSIS — Z7982 Long term (current) use of aspirin: Secondary | ICD-10-CM

## 2019-08-30 LAB — CBC
HCT: 44.8 % (ref 36.0–46.0)
Hemoglobin: 14.5 g/dL (ref 12.0–15.0)
MCH: 30 pg (ref 26.0–34.0)
MCHC: 32.4 g/dL (ref 30.0–36.0)
MCV: 92.8 fL (ref 80.0–100.0)
Platelets: 326 10*3/uL (ref 150–400)
RBC: 4.83 MIL/uL (ref 3.87–5.11)
RDW: 12.9 % (ref 11.5–15.5)
WBC: 12.7 10*3/uL — ABNORMAL HIGH (ref 4.0–10.5)
nRBC: 0 % (ref 0.0–0.2)

## 2019-08-30 LAB — COMPREHENSIVE METABOLIC PANEL
ALT: 25 U/L (ref 0–44)
AST: 32 U/L (ref 15–41)
Albumin: 3.8 g/dL (ref 3.5–5.0)
Alkaline Phosphatase: 75 U/L (ref 38–126)
Anion gap: 11 (ref 5–15)
BUN: 9 mg/dL (ref 8–23)
CO2: 36 mmol/L — ABNORMAL HIGH (ref 22–32)
Calcium: 10.2 mg/dL (ref 8.9–10.3)
Chloride: 96 mmol/L — ABNORMAL LOW (ref 98–111)
Creatinine, Ser: 0.69 mg/dL (ref 0.44–1.00)
GFR calc Af Amer: >60 mL/min (ref >60)
GFR calc non Af Amer: >60 mL/min (ref >60)
Glucose, Bld: 211 mg/dL — ABNORMAL HIGH (ref 70–99)
Potassium: 2.5 mmol/L — CL (ref 3.5–5.1)
Sodium: 143 mmol/L (ref 135–145)
Total Bilirubin: 0.6 mg/dL (ref 0.3–1.2)
Total Protein: 8.6 g/dL — ABNORMAL HIGH (ref 6.5–8.1)

## 2019-08-30 LAB — URINALYSIS, COMPLETE (UACMP) WITH MICROSCOPIC
Bacteria, UA: NONE SEEN
Bilirubin Urine: NEGATIVE
Glucose, UA: NEGATIVE mg/dL
Hgb urine dipstick: NEGATIVE
Ketones, ur: NEGATIVE mg/dL
Leukocytes,Ua: NEGATIVE
Nitrite: NEGATIVE
Protein, ur: NEGATIVE mg/dL
Specific Gravity, Urine: 1.008 (ref 1.005–1.030)
pH: 6 (ref 5.0–8.0)

## 2019-08-30 LAB — POCT I-STAT EG7
Acid-Base Excess: 10 mmol/L — ABNORMAL HIGH (ref 0.0–2.0)
Bicarbonate: 38.7 mmol/L — ABNORMAL HIGH (ref 20.0–28.0)
Calcium, Ion: 1.18 mmol/L (ref 1.15–1.40)
HCT: 40 % (ref 36.0–46.0)
Hemoglobin: 13.6 g/dL (ref 12.0–15.0)
O2 Saturation: 99 %
Potassium: 3.4 mmol/L — ABNORMAL LOW (ref 3.5–5.1)
Sodium: 142 mmol/L (ref 135–145)
TCO2: 41 mmol/L — ABNORMAL HIGH (ref 22–32)
pCO2, Ven: 69.6 mmHg — ABNORMAL HIGH (ref 44.0–60.0)
pH, Ven: 7.353 (ref 7.250–7.430)
pO2, Ven: 168 mmHg — ABNORMAL HIGH (ref 32.0–45.0)

## 2019-08-30 LAB — CBG MONITORING, ED
Glucose-Capillary: 181 mg/dL — ABNORMAL HIGH (ref 70–99)
Glucose-Capillary: 152 mg/dL — ABNORMAL HIGH (ref 70–99)

## 2019-08-30 LAB — LACTIC ACID, PLASMA: Lactic Acid, Venous: 2.5 mmol/L (ref 0.5–1.9)

## 2019-08-30 LAB — MAGNESIUM: Magnesium: 1.8 mg/dL (ref 1.7–2.4)

## 2019-08-30 LAB — PHENYTOIN LEVEL, TOTAL: Phenytoin Lvl: <2.5 ug/mL — ABNORMAL LOW (ref 10.0–20.0)

## 2019-08-30 LAB — TSH: TSH: 2.023 u[IU]/mL (ref 0.350–4.500)

## 2019-08-30 LAB — AMMONIA: Ammonia: 59 umol/L — ABNORMAL HIGH (ref 9–35)

## 2019-08-30 MED ORDER — LORAZEPAM 2 MG/ML IJ SOLN: 1.0000 mg | INTRAMUSCULAR | Status: DC | PRN

## 2019-08-30 MED ORDER — POTASSIUM CHLORIDE IN NACL 20-0.9 MEQ/L-% IV SOLN
INTRAVENOUS | Status: DC
  Filled 2019-08-30 (×2): qty 1000

## 2019-08-30 MED ORDER — SODIUM CHLORIDE 0.9 % IV SOLN
3000.0000 mg | Freq: Once | INTRAVENOUS | Status: AC
  Administered 2019-08-30: 3000 mg via INTRAVENOUS
  Filled 2019-08-30 (×2): qty 30

## 2019-08-30 MED ORDER — SODIUM CHLORIDE 0.9 % IV BOLUS
1000.0000 mL | Freq: Once | INTRAVENOUS | Status: AC
  Administered 2019-08-30: 22:00:00 1000 mL via INTRAVENOUS

## 2019-08-30 MED ORDER — POTASSIUM CHLORIDE 10 MEQ/100ML IV SOLN: 10.0000 meq | INTRAVENOUS | Status: AC

## 2019-08-30 MED ORDER — LACTATED RINGERS IV BOLUS
500.0000 mL | Freq: Once | INTRAVENOUS | Status: AC
  Administered 2019-08-30: 500 mL via INTRAVENOUS

## 2019-08-30 MED ORDER — ENOXAPARIN SODIUM 40 MG/0.4ML ~~LOC~~ SOLN
40.0000 mg | SUBCUTANEOUS | Status: DC
  Administered 2019-08-31 – 2019-09-09 (×10): 40 mg via SUBCUTANEOUS
  Filled 2019-08-30 (×10): qty 0.4

## 2019-08-30 MED ORDER — LORAZEPAM 2 MG/ML IJ SOLN
2.0000 mg | Freq: Once | INTRAMUSCULAR | Status: AC
  Administered 2019-08-30: 2 mg via INTRAVENOUS
  Filled 2019-08-30: qty 1

## 2019-08-30 MED ORDER — LEVETIRACETAM IN NACL 1000 MG/100ML IV SOLN
1000.0000 mg | Freq: Two times a day (BID) | INTRAVENOUS | Status: DC
  Administered 2019-08-31 – 2019-09-01 (×3): 1000 mg via INTRAVENOUS
  Filled 2019-08-30 (×3): qty 100

## 2019-08-30 MED ORDER — SODIUM CHLORIDE 0.9 % IV SOLN
20.0000 mg/kg | Freq: Once | INTRAVENOUS | Status: AC
  Administered 2019-08-30: 1360 mg via INTRAVENOUS
  Filled 2019-08-30: qty 27.2

## 2019-08-30 MED ORDER — PHENYTOIN SODIUM 50 MG/ML IJ SOLN
100.0000 mg | Freq: Three times a day (TID) | INTRAMUSCULAR | Status: DC
  Administered 2019-08-31 – 2019-09-02 (×7): 100 mg via INTRAVENOUS
  Filled 2019-08-30 (×9): qty 2

## 2019-08-30 MED ORDER — ACETAMINOPHEN 650 MG RE SUPP: 650.0000 mg | RECTAL | Status: DC | PRN

## 2019-08-30 MED ORDER — ACETAMINOPHEN 325 MG PO TABS: 650.0000 mg | ORAL_TABLET | ORAL | Status: DC | PRN

## 2019-08-30 MED ORDER — POTASSIUM CHLORIDE 10 MEQ/100ML IV SOLN
10.0000 meq | INTRAVENOUS | Status: DC
  Administered 2019-08-30: 20:00:00 10 meq via INTRAVENOUS
  Filled 2019-08-30: qty 100

## 2019-08-30 NOTE — ED Notes (Signed)
SisterJudeth Porch [facility]) [phone] would like an update when possible

## 2019-08-30 NOTE — ED Notes (Signed)
Pt continues to have intermittent seizures.  Ativan 2mg  IV given for same

## 2019-08-30 NOTE — ED Triage Notes (Addendum)
Pt bib ems from [facility] after seizure lasting approx 10-15 minutes prior to ems arrival on scene. Pt given 5mg  IM midazolam by ems with resolution of seizure activity within one minute of administration. Pt placed on NRB with ems with saturations of 100%. RR 10, HR 115 ST, CBG 226, BP 126/92. Pt diaphoretic on arrival. Per EMS seizure activity was mostly L sided with L sided gaze.
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
## 2019-08-30 NOTE — ED Notes (Signed)
Pt remains unresponsive to verbal or painful stimuli   EEG continues at this time
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
## 2019-08-30 NOTE — Procedures (Addendum)
Patient Name: Amanda Roman  MRN: [id number]  Epilepsy Attending: Lora Havens  Referring Physician/Provider: Dr Kerney Elbe Date: 08/30/2019  Duration: 25.49 mins  Patient history: 79 yo F with epilepsy who presented with status epilepticus. EEG to evaluate for subclinical seizure.   Level of alertness: lethargic  AEDs during EEG study: LEV, PHT, Ativan  Technical aspects: This EEG study was done with scalp electrodes positioned according to the 10-20 International system of electrode placement. Electrical activity was acquired at a sampling rate of 500Hz  and reviewed with a high frequency filter of 70Hz  and a low frequency filter of 1Hz . EEG data were recorded continuously and digitally stored.   DESCRIPTION: EEG showed continuous generalized and lateralized right hemisphere 3-6Hz  theta-delta slowing admixed with 15 to 18 Hz, 2-3 uV beta activity with irregular morphology distributed symmetrically and diffusely. Sharp waves were also noted in right frontotemporal region.  Sharply contoured quasi-rhythmic activity that appeared to be evolving from theta to delta slowing was seen in vertex region without any clinical signs  ( BIRD) was also noted.  Hyperventilation and photic stimulation were not performed.  ABNORMALITY - Brief ictal-interictal rhythmic discharges, vertex - Sharp waves, right frontotemporal region - Continuous slow, generalized and lateralized right hemisphere  IMPRESSION: This study showed evidence of epileptogenicity in right frontotemporal region. Brief rhythmic ictal-interictal discharges were also seen in vertex region without clinical signs which is on the ictal-interictal continuum. Additionally, there is a non- specific cortical dysfunction in right hemisphere and severe diffuse encephalopathy, non specific to etiology, but could be secondary to medications.   Dr Cheral Marker was notified.   [name]

## 2019-08-30 NOTE — ED Notes (Signed)
Amanda Roman sister [phone]) [id number] looking for an update

## 2019-08-30 NOTE — ED Notes (Signed)
Dr. Cheral Marker paged to RN per her request

## 2019-08-30 NOTE — ED Notes (Signed)
I Stat VBG result for a PCo2 of 69.6 reported to Dr. Yong Channel

## 2019-08-30 NOTE — Progress Notes (Signed)
EEG complete - results pending 

## 2019-08-30 NOTE — ED Notes (Signed)
Pt having seizures effecting left side.  Seizures lasting approx 30 seconds at a time.  Pt will stop seizing then starting again

## 2019-08-30 NOTE — ED Notes (Signed)
Pt with no response to verbal or painful stimuli. RN remains at the bedside.
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
## 2019-08-30 NOTE — ED Notes (Signed)
Verbal order from Dr. Cheral Marker, pt only needs 1 run of Potassium chloride

## 2019-08-30 NOTE — ED Notes (Signed)
EEG being obtained at bedside

## 2019-08-30 NOTE — Progress Notes (Signed)
LTM EEG hooked up and running - no initial skin breakdown - push button tested - neuro notified.  

## 2019-08-31 DIAGNOSIS — I959 Hypotension, unspecified: Secondary | ICD-10-CM

## 2019-08-31 DIAGNOSIS — L8990 Pressure ulcer of unspecified site, unspecified stage: Secondary | ICD-10-CM | POA: Diagnosis present

## 2019-08-31 DIAGNOSIS — I10 Essential (primary) hypertension: Secondary | ICD-10-CM

## 2019-08-31 DIAGNOSIS — E876 Hypokalemia: Secondary | ICD-10-CM

## 2019-08-31 DIAGNOSIS — Z79899 Other long term (current) drug therapy: Secondary | ICD-10-CM

## 2019-08-31 LAB — CBC WITH DIFFERENTIAL/PLATELET
Abs Immature Granulocytes: 0.04 10*3/uL (ref 0.00–0.07)
Basophils Absolute: 0 10*3/uL (ref 0.0–0.1)
Basophils Relative: 0 %
Eosinophils Absolute: 0 10*3/uL (ref 0.0–0.5)
Eosinophils Relative: 0 %
HCT: 35.6 % — ABNORMAL LOW (ref 36.0–46.0)
Hemoglobin: 11.6 g/dL — ABNORMAL LOW (ref 12.0–15.0)
Immature Granulocytes: 0 %
Lymphocytes Relative: 8 %
Lymphs Abs: 0.7 10*3/uL (ref 0.7–4.0)
MCH: 30.3 pg (ref 26.0–34.0)
MCHC: 32.6 g/dL (ref 30.0–36.0)
MCV: 93 fL (ref 80.0–100.0)
Monocytes Absolute: 0.6 10*3/uL (ref 0.1–1.0)
Monocytes Relative: 6 %
Neutro Abs: 8.5 10*3/uL — ABNORMAL HIGH (ref 1.7–7.7)
Neutrophils Relative %: 86 %
Platelets: 260 10*3/uL (ref 150–400)
RBC: 3.83 MIL/uL — ABNORMAL LOW (ref 3.87–5.11)
RDW: 13.2 % (ref 11.5–15.5)
WBC: 9.8 10*3/uL (ref 4.0–10.5)
nRBC: 0 % (ref 0.0–0.2)

## 2019-08-31 LAB — GLUCOSE, CAPILLARY
Glucose-Capillary: 114 mg/dL — ABNORMAL HIGH (ref 70–99)
Glucose-Capillary: 127 mg/dL — ABNORMAL HIGH (ref 70–99)
Glucose-Capillary: 128 mg/dL — ABNORMAL HIGH (ref 70–99)
Glucose-Capillary: 129 mg/dL — ABNORMAL HIGH (ref 70–99)

## 2019-08-31 LAB — BASIC METABOLIC PANEL
Anion gap: 10 (ref 5–15)
Anion gap: 13 (ref 5–15)
BUN: 10 mg/dL (ref 8–23)
BUN: 12 mg/dL (ref 8–23)
CO2: 32 mmol/L (ref 22–32)
CO2: 24 mmol/L (ref 22–32)
Calcium: 9.2 mg/dL (ref 8.9–10.3)
Calcium: 8.7 mg/dL — ABNORMAL LOW (ref 8.9–10.3)
Chloride: 101 mmol/L (ref 98–111)
Chloride: 104 mmol/L (ref 98–111)
Creatinine, Ser: 0.71 mg/dL (ref 0.44–1.00)
Creatinine, Ser: 0.48 mg/dL (ref 0.44–1.00)
GFR calc Af Amer: >60 mL/min (ref >60)
GFR calc Af Amer: >60 mL/min (ref >60)
GFR calc non Af Amer: >60 mL/min (ref >60)
GFR calc non Af Amer: >60 mL/min (ref >60)
Glucose, Bld: 159 mg/dL — ABNORMAL HIGH (ref 70–99)
Glucose, Bld: 120 mg/dL — ABNORMAL HIGH (ref 70–99)
Potassium: 3.2 mmol/L — ABNORMAL LOW (ref 3.5–5.1)
Potassium: 4.7 mmol/L (ref 3.5–5.1)
Sodium: 143 mmol/L (ref 135–145)
Sodium: 141 mmol/L (ref 135–145)

## 2019-08-31 LAB — SARS CORONAVIRUS 2 (TAT 6-24 HRS): SARS Coronavirus 2: NEGATIVE

## 2019-08-31 LAB — PHENYTOIN LEVEL, TOTAL: Phenytoin Lvl: 22.7 ug/mL — ABNORMAL HIGH (ref 10.0–20.0)

## 2019-08-31 MED ORDER — POTASSIUM CHLORIDE 10 MEQ/100ML IV SOLN
10.0000 meq | INTRAVENOUS | Status: AC
  Administered 2019-08-31 (×3): 10 meq via INTRAVENOUS
  Filled 2019-08-31 (×3): qty 100

## 2019-08-31 MED ORDER — DEXTROSE 5 % IV SOLN: INTRAVENOUS | Status: AC

## 2019-08-31 NOTE — Progress Notes (Signed)
Spoke with and updated [facility] on pt plan of care. All questions answered. [facility] notified this RN to reach out with any questions or concerns.   Arletta Bale, RN

## 2019-08-31 NOTE — ED Notes (Signed)
Pt's sister Edsel Petrin was called by this RN and updated on pt's condition.

## 2019-08-31 NOTE — Progress Notes (Signed)
LTM maint complete - no skin breakdown under:   Fp2, F8, A2

## 2019-08-31 NOTE — Progress Notes (Signed)
Patient arrived to 25E13. Per ED RN patient does not need to receive remaining potassium infusions ordered.

## 2019-08-31 NOTE — Progress Notes (Signed)
Subjective:  continues to be quite somnolent  Exam: Vitals:   08/31/19 1010 08/31/19 1152  BP: 129/81 125/80  Pulse: 81 78  Resp: 17 16  Temp: 97.6 F (36.4 C) 97.8 F (36.6 C)  SpO2: 100% 100%   Gen: In bed, EEG leads in place Resp: non-labored breathing, no acute distress Abd: soft, nt  Neuro: MS: Does not open eyes or follow commands CN: Eyes are disconjugate (this was noted with sedation previously), pupils are reactive, corneals are intact Motor: She withdraws to noxious stimulation bilaterally, does not localize Sensory: As above  Pertinent Labs: Phenytoin level on arrival less than 2.5, but apparently she was taken off of this sometime ago. Creatinine 0.7  Impression: 79 year old female with breakthrough seizures in the setting of weaning Ativan.  She had a similar presentation with prolonged partial status epilepticus a year ago.  At that time, discussions regarding her suppression were had with family, but she has made it clear that she never wants to have a breathing tube.  I again revisited this with her sister and confirmed that this was her wish and she confirmed this.  At this point, I do think that she has a concerning pattern in her right hemisphere that could be on the ictal-interictal continuum, but her mental status would not support being more aggressive at this time.  She had a similar presentation with her previous episode of status epilepticus and therefore my hope is that she will have a similar response with gradual improvement with time.  Recommendations: 1) continue Keppra 1 g twice daily 2) continue phenytoin 100 3 times daily, check level 3) neurology will continue to follow  Roland Rack, MD [facility] [phone]  If 7pm- 7am, please page neurology on call as listed in [place].

## 2019-08-31 NOTE — Progress Notes (Signed)
   Subjective:   Ms. Blaisdell was asleep when team went to see patient. She was minimally responsive.   Objective:  Vital signs in last 24 hours: Vitals:   08/31/19 0858 08/31/19 0903 08/31/19 1010 08/31/19 1152  BP: 133/60  129/81 125/80  Pulse: 80  81 78  Resp: 14  17 16   Temp: 98.5 F (36.9 C)  97.6 F (36.4 C) 97.8 F (36.6 C)  TempSrc: Axillary  Axillary Oral  SpO2: 100% 99% 100% 100%  Weight:       Physical Exam  Constitutional: She appears well-developed and well-nourished. No distress.  HENT:  Head: Normocephalic and atraumatic.  Eyes: Conjunctivae are normal.  Cardiovascular: Normal rate, regular rhythm and normal heart sounds.  Respiratory: Effort normal. No respiratory distress.  GI: Soft. Bowel sounds are normal. She exhibits no distension.  Musculoskeletal:     Comments: Moves extremities  Neurological:  Perrla, corneal intact.   Assessment/Plan:  Principal Problem:   Status epilepticus ([place]) Active Problems:   Hypokalemia  Ms. Hermansen 79 y.o with vasc dementia, essent htn, mdd, seizure disorder who presented from [facility] subsequent to having 10-32min seizure. Patient is being managed for seizure disorder.   Seizure disorder 2/2 ativan wean Patient brought in after having 10-40min seizure episode. Thought to be secondary to patient being tapered off aed (ativan). Per the patient's pcp Dr. Jimmye Norman the patient has been tapered off his ativan very gradually (1mg /month) to help with unresponsiveness.   EEG 2/21 am showed epileptogenicity in right frontotemporal region with nonspecific cortical dysfunction in right hemisphere. CT head showing new csf collectinon in left frontoparietal convexity facoring subdural hygroma or chronic subdural hematoma with minimal 2-33mm rightward midline shift.   Patient was given a keppra and fosphenytoin load on arival to [facility] ED.   -appreciate neurology for following  -keprra 1000mg  bid  -dilantin 100mg  tid    -dilantin level on Monday 2/22  Hypokalemia  Patient's k 3.2. Repleted 67meq kcl total since admission per [facility].   -pending bmp 12:15 2/21 to recheck potassium   Hypotension Ranging 100-130s/60-80s.  -hold home anti-hypertensive meds   Dispo: Anticipated discharge in approximately 2-3 day(s).   Lars Mage, MD 08/31/2019, 12:25 PM

## 2019-08-31 NOTE — Procedures (Addendum)
Patient Name: Amanda Roman  MRN: [id number]  Epilepsy Attending: Lora Havens  Referring Physician/Provider: Dr Kerney Elbe Duration: 08/30/2019 2148 to 08/31/2019 2148  Patient history: 79 yo F with epilepsy who presented with status epilepticus. EEG to evaluate for subclinical seizure.   Level of alertness: lethargic  AEDs during EEG study: LEV, PHT  Technical aspects: This EEG study was done with scalp electrodes positioned according to the 10-20 International system of electrode placement. Electrical activity was acquired at a sampling rate of 500Hz  and reviewed with a high frequency filter of 70Hz  and a low frequency filter of 1Hz . EEG data were recorded continuously and digitally stored.   DESCRIPTION: EEG showed continuous generalized and lateralized right hemisphere 3-6Hz  theta-delta slowing admixed with 15 to 18 Hz, 2-3 uV beta activity with irregular morphology distributed symmetrically and diffusely. Frequent sharp waves were also noted in right frontotemporal region. Sharp waves at times appeared rhythmic without clear evolution and no clinical signs are likely brief ictal-interical rhythmic discharges ( BIRD). Hyperventilation and photic stimulation were not performed.  ABNORMALITY - Brief ictal-interictal rhythmic discharges, right frontotemporal region - Sharp waves, right frontotemporal region - Continuous slow, generalized and lateralized right hemisphere  IMPRESSION: This study showed evidence of epileptogenicity in right frontotemporal region. Brief rhythmic ictal-interictal discharges were also seen in right frontotemporal region without clinical signs which is on the ictal-interictal continuum.Additionally, there is a non- specific cortical dysfunction in right hemisphere and moderate to severe diffuse encephalopathy, non specific to etiology, but could be secondary to medications.      Tuana Hoheisel [name]

## 2019-08-31 NOTE — ED Notes (Signed)
Report to rn on 4e

## 2019-09-01 DIAGNOSIS — F329 Major depressive disorder, single episode, unspecified: Secondary | ICD-10-CM

## 2019-09-01 LAB — URINE CULTURE: Culture: NO GROWTH

## 2019-09-01 LAB — GLUCOSE, CAPILLARY
Glucose-Capillary: 126 mg/dL — ABNORMAL HIGH (ref 70–99)
Glucose-Capillary: 107 mg/dL — ABNORMAL HIGH (ref 70–99)
Glucose-Capillary: 105 mg/dL — ABNORMAL HIGH (ref 70–99)

## 2019-09-01 LAB — PHENYTOIN LEVEL, TOTAL: Phenytoin Lvl: 23.3 ug/mL — ABNORMAL HIGH (ref 10.0–20.0)

## 2019-09-01 MED ORDER — LEVETIRACETAM IN NACL 1500 MG/100ML IV SOLN
1500.0000 mg | Freq: Two times a day (BID) | INTRAVENOUS | Status: DC
  Administered 2019-09-01 – 2019-09-09 (×16): 1500 mg via INTRAVENOUS
  Filled 2019-09-01 (×19): qty 100

## 2019-09-01 MED ORDER — AMLODIPINE BESYLATE 5 MG PO TABS: 5.0000 mg | ORAL_TABLET | Freq: Every day | ORAL | Status: DC

## 2019-09-01 MED ORDER — DEXTROSE-NACL 5-0.45 % IV SOLN: INTRAVENOUS | Status: AC

## 2019-09-01 MED ORDER — LEVETIRACETAM IN NACL 500 MG/100ML IV SOLN
500.0000 mg | Freq: Once | INTRAVENOUS | Status: AC
  Administered 2019-09-01: 500 mg via INTRAVENOUS
  Filled 2019-09-01: qty 100

## 2019-09-01 MED ORDER — LORAZEPAM 2 MG/ML IJ SOLN
1.0000 mg | Freq: Four times a day (QID) | INTRAMUSCULAR | Status: DC | PRN
  Filled 2019-09-01 (×2): qty 1

## 2019-09-01 NOTE — Progress Notes (Addendum)
Subjective: Patient is sleeping in bed, not answering questions.  No acute events overnight per RN.  Also per RN patient appears more awake today than yesterday and was intermittently able to see her name out loud.  ROS: Unable to obtain due to poor mental status  Examination  Vital signs in last 24 hours: Temp:  [97.8 F (36.6 C)-98.9 F (37.2 C)] 98.9 F (37.2 C) (02/22 1000) Pulse Rate:  [78-94] 86 (02/22 1000) Resp:  [14-23] 20 (02/22 1000) BP: (123-159)/(72-127) 150/93 (02/22 1000) SpO2:  [98 %-100 %] 100 % (02/22 1000)  General: lying in bed, cachectic looking CVS: pulse-normal rate and rhythm RS: breathing comfortably, CTA B Extremities: normal, warm  Neuro: MS: Lying with eyes closed, states "ow" with noxious stimulation, does not follow commands CN: pupils equal and reactive, no forced gaze deviation, no nystagmus, corneal reflex intact, no apparent facial asymmetry, difficult to assess rest of the cranial nerves due to AMS Motor: Withdraws to noxious stimuli with antigravity strength in all 4 extremities  Basic Metabolic Panel: Recent Labs  Lab 08/30/19 1840 08/30/19 1850 08/31/19 0317 08/31/19 1215  NA 143 142 143 141  K 2.5* 3.4* 3.2* 4.7  CL 96*  --  101 104  CO2 36*  --  32 24  GLUCOSE 211*  --  159* 120*  BUN 9  --  10 12  CREATININE 0.69  --  0.71 0.48  CALCIUM 10.2  --  9.2 8.7*  MG 1.8  --   --   --     CBC: Recent Labs  Lab 08/30/19 1837 08/30/19 1850 08/31/19 0317  WBC 12.7*  --  9.8  NEUTROABS  --   --  8.5*  HGB 14.5 13.6 11.6*  HCT 44.8 40.0 35.6*  MCV 92.8  --  93.0  PLT 326  --  260     Coagulation Studies: No results for input(s): LABPROT, INR in the last 72 hours.  Imaging CT head without contrast 08/30/2019: 1. No definite acute intracranial abnormality. 2. New CSF density extra-axial collection along the left frontoparietal convexity as detailed above. 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 There is a minimal 2-3 mm rightward midline shift likely secondary to this collection.   ASSESSMENT AND PLAN: 79 year old female with breakthrough seizures in the setting of weaning Ativan.  She had a similar presentation with prolonged partial status epilepticus a year ago.  At that time, discussions regarding her suppression were had with family, but she has made it clear that she never wants to have a breathing tube which has been reconfirmed during this admission by Dr. Leonel Ramsay.  Status epilepticus (resolved) Epilepsy with breakthrough seizures Acute toxic-metabolic, postictal encephalopathy -Total phenytoin level 23.3, normal albumin on 08/30/2019.  Repeat total phenytoin level pending -Per RN, patient appears to be clinically more alert compared to yesterday. -EEG continues to show frequent sharp waves in right frontotemporal region, sharp waves in vertex region as well as rhythmic discharges in the right frontotemporal region which are on the ictal-interictal continuum. -Status epilepticus in the setting of weaning Ativan.  Acute encephalopathy likely secondary to status epilepticus as well as antiepileptic drugs  Recommendations -As EEG continues to show frequent sharp waves and BIRDs, will increase Keppra to 1500 mg twice daily.  Also,Total phenytoin level appears elevated, repeat pending.  If repeat continues to be elevated, will need to reduce phenytoin to minimize excessive sedation -At this point, as patient is clinically improving, will discontinue  LTM EEG.  If patient starts deteriorating again, can consider repeat EEG.  However patient does not want aggressive measures including intubation.  Therefore, will likely have to add another AED ( likely vimpat) if needed at that time. -Continue seizure precautions, delirium precautions, minimize sedating medications -As needed IV Ativan 1 mg for generalized tonic-clonic seizure lasting more than 2 minutes or focal seizure  lasting more than 5 minutes.  Using lower dose to minimize sedation and patient was DNR.  Thank you for allowing [place] to participate in the care of this patient.  Neurology will follow.  Please page neuro hospitalist for any further questions after 5 PM.  Ahmani Daoud [name]

## 2019-09-01 NOTE — Procedures (Addendum)
Patient Name:Amanda Roman [id number] Epilepsy Attending:Ali Mohl [name] Referring Physician/Provider:Dr Kerney Elbe Duration: 08/31/2019 2148 to 09/01/2019 1255  Patient history:79 yo F with epilepsy who presented with status epilepticus. EEG to evaluate for subclinical seizure.  Level of alertness:lethargic  AEDs during EEG study:LEV, PHT  Technical aspects: This EEG study was done with scalp electrodes positioned according to the 10-20 International system of electrode placement. Electrical activity was acquired at a sampling rate of 500Hz  and reviewed with a high frequency filter of 70Hz  and a low frequency filter of 1Hz . EEG data were recorded continuously and digitally stored.  DESCRIPTION: EEG showed continuous generalized and lateralized right hemisphere 3-6Hz  theta-delta slowing admixed with 15 to 18 Hz, 2-3 uV beta activity with irregular morphology distributed symmetrically and diffusely.Frequent sharp waves were also noted in right frontotemporal region. Sharp waves at times appeared rhythmic without clear evolution and no clinical signs are likely brief ictal-interical rhythmic discharges ( BIRD).Intermittently, independent sharp waves were also noted in vertex region, periodic at 1-1.5hz . Hyperventilation and photic stimulation were not performed.  ABNORMALITY - Brief ictal-interictal rhythmic discharges, right frontotemporal region - Sharp waves, vertex - Sharp waves, right frontotemporal region - Continuous slow, generalized and lateralized right hemisphere  IMPRESSION: This studyshowed evidence of independent epileptogenicity in right frontotemporal as well as vertex region.Brief rhythmic ictal-interictal discharges were also seen in right frontotemporal region without clinical signswhich is on theictal-interictal continuum.Additionally, there is a non- specific cortical dysfunction in right hemisphere and moderate to severe diffuse  encephalopathy, non specific to etiology, but could be secondary to medications.

## 2019-09-01 NOTE — Progress Notes (Signed)
LTM EEG discontinued - no skin breakdown at unhook.   

## 2019-09-01 NOTE — Progress Notes (Addendum)
  Subjective:  Patient seen resting comfortably in bed. Responds to voice  Objective:    Vital Signs (last 24 hours): Vitals:   08/31/19 2343 09/01/19 0000 09/01/19 0200 09/01/19 0400  BP: (!) 150/75 136/72 (!) 146/78 (!) 142/73  Pulse: 91 87 88 84  Resp: 17 19 15  (!) 23  Temp: 98.4 F (36.9 C)  98.8 F (37.1 C)   TempSrc: Oral  Axillary   SpO2: 99% 100% 100% 100%  Weight:        Physical Exam: General Resting in bed, responds to voice  Neurology Follows some basic commands, responds to voice, closes eyes to light  Cardiac Regular rate and rhythm, no murmurs, rubs, or gallops  Pulmonary Clear to auscultation bilaterally without wheezes, rhonchi, or rales    Assessment/Plan:   Principal Problem:   Status epilepticus (HCC) Active Problems:   Hypokalemia   Vascular dementia, uncomplicated (HCC)   Pressure injury of skin  Patient is an 79 year old female with past medical history significant for vascular dementia, essential hypertension, major depressive disorder, seizure disorder who presented from [facility] on 08/30/2019 after having an approximately 15-minute seizure.  # Seizure disorder: Recent seizure activity occurred after discontinuation of phenytoin, during Ativan taper. Seizures unlikely to be 2/2 ativan taper as was conducted gradually over several months.  On arrival to ED, patient given Keppra, fosphenytoin and Ativan.  Initial EEG showed evidence of epileptogenic in right frontotemporal region.  CT head demonstrated new CSF fluid collection in the left frontoparietal convexity with a slight rightward midline shift. *Neurology following, we appreciate their recommendations *Keppra 1500 mg every 12 hours - increased from 1000 mg BID per neuro *Phenytoin 100 mg every 8 hours, level of 22.7 yesterday, today's level pending. If continues to be elevated will need to lower dosage per neuro *Ativan 1 mg, every 2 hours as needed for seizure - decreased from 1-2 mg to  minimize sedation per neuro, patient DNI *Discontinue LTM EEG today, may need additional AED (likely vimpat) if recurrent seizures  # Hypokalemia: Initial potassium of 3.2, repleted, was 4.7 yesterday  # Hypotension: Home BP medications of amlodipine 5 mg daily. Blood pressure is high-normal, will restart amlodipine. Will continue to hold anti-hypertensives while patient is NPO  PT/OT: Consult not indicated at this time Diet: NPO, on mIVF D5-1/2 NS 100 cc/hr DVT Ppx: Lovenox 40 mg daily Admit Status: Inpatient Dispo: Anticipated discharge pending clinical improvement  Jeanmarie Hubert, MD 09/01/2019, 6:05 AM

## 2019-09-01 NOTE — Progress Notes (Signed)
Pt more alert today than yesterday. Opening eyes in response to speech and sometimes spontaneously. Neuro checks taken every 2 hours. Pt still lethargic, but moving around in bed and able to say her name at times. Continuous EEG on. Will continue to monitor.   Arletta Bale, RN

## 2019-09-02 LAB — GLUCOSE, CAPILLARY
Glucose-Capillary: 102 mg/dL — ABNORMAL HIGH (ref 70–99)
Glucose-Capillary: 127 mg/dL — ABNORMAL HIGH (ref 70–99)
Glucose-Capillary: 128 mg/dL — ABNORMAL HIGH (ref 70–99)
Glucose-Capillary: 134 mg/dL — ABNORMAL HIGH (ref 70–99)

## 2019-09-02 LAB — BASIC METABOLIC PANEL
Anion gap: 9 (ref 5–15)
BUN: 5 mg/dL — ABNORMAL LOW (ref 8–23)
CO2: 33 mmol/L — ABNORMAL HIGH (ref 22–32)
Calcium: 9.2 mg/dL (ref 8.9–10.3)
Chloride: 99 mmol/L (ref 98–111)
Creatinine, Ser: 0.39 mg/dL — ABNORMAL LOW (ref 0.44–1.00)
GFR calc Af Amer: >60 mL/min (ref >60)
GFR calc non Af Amer: >60 mL/min (ref >60)
Glucose, Bld: 140 mg/dL — ABNORMAL HIGH (ref 70–99)
Potassium: 2.9 mmol/L — ABNORMAL LOW (ref 3.5–5.1)
Sodium: 141 mmol/L (ref 135–145)

## 2019-09-02 LAB — CBC
HCT: 33.4 % — ABNORMAL LOW (ref 36.0–46.0)
Hemoglobin: 10.8 g/dL — ABNORMAL LOW (ref 12.0–15.0)
MCH: 29.8 pg (ref 26.0–34.0)
MCHC: 32.3 g/dL (ref 30.0–36.0)
MCV: 92.3 fL (ref 80.0–100.0)
Platelets: 220 10*3/uL (ref 150–400)
RBC: 3.62 MIL/uL — ABNORMAL LOW (ref 3.87–5.11)
RDW: 13.3 % (ref 11.5–15.5)
WBC: 5.9 10*3/uL (ref 4.0–10.5)
nRBC: 0 % (ref 0.0–0.2)

## 2019-09-02 LAB — PHENYTOIN LEVEL, TOTAL
Phenytoin Lvl: 21.7 ug/mL — ABNORMAL HIGH (ref 10.0–20.0)
Phenytoin Lvl: 20.9 ug/mL — ABNORMAL HIGH (ref 10.0–20.0)

## 2019-09-02 MED ORDER — DEXTROSE-NACL 5-0.45 % IV SOLN: INTRAVENOUS | Status: AC

## 2019-09-02 MED ORDER — POTASSIUM CHLORIDE 10 MEQ/100ML IV SOLN
10.0000 meq | INTRAVENOUS | Status: AC
  Administered 2019-09-02 (×6): 10 meq via INTRAVENOUS
  Filled 2019-09-02 (×5): qty 100

## 2019-09-02 MED ORDER — PHENYTOIN SODIUM 50 MG/ML IJ SOLN
80.0000 mg | Freq: Three times a day (TID) | INTRAMUSCULAR | Status: DC
  Administered 2019-09-02 – 2019-09-03 (×5): 80 mg via INTRAVENOUS
  Filled 2019-09-02 (×6): qty 1.6

## 2019-09-02 MED ORDER — MAGNESIUM SULFATE 2 GM/50ML IV SOLN
2.0000 g | Freq: Once | INTRAVENOUS | Status: AC
  Administered 2019-09-02: 2 g via INTRAVENOUS
  Filled 2019-09-02: qty 50

## 2019-09-02 NOTE — Progress Notes (Addendum)
  Subjective:  Patient seen at bedside this AM, wakes up to voice, responds to questions. Patient states that she has some stomach pain and says it is from constipation.   Objective:    Vital Signs (last 24 hours): Vitals:   09/01/19 1600 09/01/19 1800 09/02/19 0400 09/02/19 0415  BP: (!) 156/88 132/83 (!) 171/78   Pulse: 82 85 85 73  Resp: 19 20 20 19   Temp: 98 F (36.7 C) 99 F (37.2 C)  98 F (36.7 C)  TempSrc: Oral Oral  Oral  SpO2:  96% 96% 100%  Weight:        Physical Exam: General Alert and answers questions appropriately, no acute distress  Cardiac Regular rate and rhythm, no murmurs, rubs, or gallops  Pulmonary Clear to auscultation bilaterally without wheezes, rhonchi, or rales    CBC Latest Ref Rng & Units 09/02/2019 08/31/2019 08/30/2019  WBC 4.0 - 10.5 K/uL 5.9 9.8 -  Hemoglobin 12.0 - 15.0 g/dL 10.8(L) 11.6(L) 13.6  Hematocrit 36.0 - 46.0 % 33.4(L) 35.6(L) 40.0  Platelets 150 - 400 K/uL 220 260 -   BMP Latest Ref Rng & Units 09/02/2019 08/31/2019 08/31/2019  Glucose 70 - 99 mg/dL 140(H) 120(H) 159(H)  BUN 8 - 23 mg/dL 5(L) 12 10  Creatinine 0.44 - 1.00 mg/dL 0.39(L) 0.48 0.71  BUN/Creat Ratio 12 - 28 - - -  Sodium 135 - 145 mmol/L 141 141 143  Potassium 3.5 - 5.1 mmol/L 2.9(L) 4.7 3.2(L)  Chloride 98 - 111 mmol/L 99 104 101  CO2 22 - 32 mmol/L 33(H) 24 32  Calcium 8.9 - 10.3 mg/dL 9.2 8.7(L) 9.2     Assessment/Plan:   Principal Problem:   Status epilepticus (HCC) Active Problems:   Hypokalemia   Vascular dementia, uncomplicated (HCC)   Pressure injury of skin  Patient is an 79 year old female with past medical history significant for vascular dementia, essential hypertension, major depressive disorder, seizure disorder presented from [facility] on 08/30/2019 after having approximately 15-minute seizure.  # Seizure disorder: On arrival to ED, patient was given Keppra, fosphenytoin, and Ativan.  Initial EEG showed evidence epileptogenic activity in  right frontoparietal region.  CT head demonstrated new CSF fluid collection in the left frontoparietal convexity with a slight rightward midline shift.  Continued EEG monitoring demonstrated brief ictal-interictal rhythmic discharges in right frontotemporal region. *Neurology following, we appreciate their recommendations *Keppra was increased to 1500 mg every 12 hours yesterday *Reduce from Phenytoin 100 mg every 8 hours to Phenytoin 80 mg every 8 hours per neurology as patient still more sedated than baseline. Spoke with pharmacy and given rate there is low concern for tissue cellulitis. Will continue with phenytoin at this time. *Ativan 1 mg every 2 hours as needed for seizure  * SLP eval ordered  # Hypokalemia: Potassium of 2.9 today, repleting * K IV 16mEq Q1HR x6  # Hypotension: Continue to hold home anti-hypertensive while patient NPO  PT/OT: Consulted Diet: NPO, on mIVF D5-1/2NS at 100 cc/hr DVT Ppx: Lovenox 40 mg daily Admit Status: Inpatient Dispo: Anticipated discharge pending clinical improvement  Jeanmarie Hubert, MD 09/02/2019, 6:20 AM

## 2019-09-02 NOTE — Progress Notes (Signed)
Pt awake this pm, followed some commands but seemed to ignore others. Unable to tell me name, birthday, and day. Did tell me she was at a hospital, but could not tell me which one. Per day shift report, sister states this is her baseline. Will continue to monitor.

## 2019-09-02 NOTE — Progress Notes (Addendum)
Subjective: Amanda Roman. Continues to be drowsy. Per RN, patient has been drowsy all morning.  I called and spoke with patient's Sister Edsel Petrin who states at baseline patient is dependent for all her ADLs, can communicate and recognizes family members but sleeps a lot.  She states she visited patient on Sunday and patient was able to wake up and communicate with her a little bit.  She is planning on coming again this afternoon.  ROS: unable to obtain due to poor mental status  Examination  Vital signs in last 24 hours: Temp:  [98 F (36.7 C)-99 F (37.2 C)] 98.3 F (36.8 C) (02/23 0824) Pulse Rate:  [61-92] 61 (02/23 1100) Resp:  [15-23] 23 (02/23 1100) BP: (132-171)/(77-93) 163/77 (02/23 1100) SpO2:  [96 %-100 %] 100 % (02/23 1100) FiO2 (%):  [2.5 %] 2.5 % (02/23 0400)  General: lying in bed, cachectic looking CVS: pulse-normal rate and rhythm RS: breathing comfortably, CTAB Extremities: normal, warm  Neuro: MS: Laying with eyes closed, attempted to squeeze hand on command but was not consistent CN: pupils equal and reactive, no forced gaze deviation, no nystagmus, corneal reflex intact, no apparent facial asymmetry, difficult to assess rest of the cranial nerves due to AMS Motor: Withdraws to noxious stimuli with antigravity strength in all 4 extremities  Basic Metabolic Panel: Recent Labs  Lab 08/30/19 1840 08/30/19 1840 08/30/19 1850 08/31/19 0317 08/31/19 1215 09/02/19 0239  NA 143  --  142 143 141 141  K 2.5*  --  3.4* 3.2* 4.7 2.9*  CL 96*  --   --  101 104 99  CO2 36*  --   --  32 24 33*  GLUCOSE 211*  --   --  159* 120* 140*  BUN 9  --   --  10 12 5*  CREATININE 0.69  --   --  0.71 0.48 0.39*  CALCIUM 10.2   < >  --  9.2 8.7* 9.2  MG 1.8  --   --   --   --   --    < > = values in this interval not displayed.    CBC: Recent Labs  Lab 08/30/19 1837 08/30/19 1850 08/31/19 0317 09/02/19 0239  WBC 12.7*  --  9.8 5.9  NEUTROABS  --   --  8.5*  --   HGB  14.5 13.6 11.6* 10.8*  HCT 44.8 40.0 35.6* 33.4*  MCV 92.8  --  93.0 92.3  PLT 326  --  260 220     Coagulation Studies: No results for input(s): LABPROT, INR in the last 72 hours.  Imaging No new brain imaging    ASSESSMENT AND PLAN: 79 year old female with breakthrough seizures in the setting of weaning Ativan.She had a similar presentation with prolonged partial status epilepticus a year ago. At that time, discussions regarding her suppression were had with family, but she has made it clear that she never wants to have a breathing tube which has been reconfirmed during this admission by Dr. Leonel Ramsay.  Status epilepticus (resolved) Epilepsy with breakthrough seizures Acute toxic-metabolic, postictal encephalopathy -Total phenytoin level 20.9, collected at 0239 am, next dose of dilantin was at 0600 -Status epilepticus in the setting of weaning Ativan.  Acute encephalopathy likely secondary to status epilepticus as well as antiepileptic drugs  Recommendations - Even though her dilantin level is 20.9 which is almost therapeutic, patient appears to be excessively drowsy. Therefore, will reduce dose to phenytoin 80mg  TID to minimize sedation. Will check trough  level in 5 days and adjust if needed -  If patient starts deteriorating again, can consider repeat EEG.  However patient does not want aggressive measures including intubation.  Therefore, will likely have to add another AED ( likely vimpat) if needed at that time. -Continue seizure precautions, delirium precautions, minimize sedating medications -As needed IV Ativan 1 mg for generalized tonic-clonic seizure lasting more than 2 minutes or focal seizure lasting more than 5 minutes.  Using lower dose to minimize sedation and patient was DNR. -Patient has advanced dementia and poor neurological baseline.  Therefore anticipate patient will require longer period for recovery. - PT/OT for rehab - Speech and dysphagia eval to  eventually transition meds to PO once patient more awake  Thank you for allowing [place] to participate in the care of this patient.  Neurology will follow.  Please page neuro hospitalist for any further questions after 5 PM.  I have spent a total of 35 minutes with the patient reviewing hospital notes,  test results, labs and examining the patient as well as establishing an assessment and plan that was discussed personally with the patient's physician, nurse and sister.  > 50% of time was spent in direct patient care.    Timiko Offutt [name]

## 2019-09-02 NOTE — Progress Notes (Signed)
Spoke with family at bedside. Per sister "pt is at baseline seems the same as she was when I zoomed her". Will continue to monitor.

## 2019-09-03 ENCOUNTER — Inpatient Hospital Stay (HOSPITAL_COMMUNITY): Payer: Medicare (Managed Care)

## 2019-09-03 ENCOUNTER — Encounter (HOSPITAL_COMMUNITY): Payer: Self-pay | Admitting: Internal Medicine

## 2019-09-03 ENCOUNTER — Other Ambulatory Visit: Payer: Self-pay

## 2019-09-03 DIAGNOSIS — R569 Unspecified convulsions: Secondary | ICD-10-CM

## 2019-09-03 LAB — BLOOD GAS, ARTERIAL
Acid-Base Excess: 10.3 mmol/L — ABNORMAL HIGH (ref 0.0–2.0)
Bicarbonate: 34.8 mmol/L — ABNORMAL HIGH (ref 20.0–28.0)
FIO2: 21
O2 Saturation: 99.1 %
Patient temperature: 37
pCO2 arterial: 51.5 mmHg — ABNORMAL HIGH (ref 32.0–48.0)
pH, Arterial: 7.445 (ref 7.350–7.450)
pO2, Arterial: 128 mmHg — ABNORMAL HIGH (ref 83.0–108.0)

## 2019-09-03 LAB — CBC
HCT: 41.5 % (ref 36.0–46.0)
Hemoglobin: 14 g/dL (ref 12.0–15.0)
MCH: 30.4 pg (ref 26.0–34.0)
MCHC: 33.7 g/dL (ref 30.0–36.0)
MCV: 90.2 fL (ref 80.0–100.0)
Platelets: 248 10*3/uL (ref 150–400)
RBC: 4.6 MIL/uL (ref 3.87–5.11)
RDW: 13.1 % (ref 11.5–15.5)
WBC: 5.5 10*3/uL (ref 4.0–10.5)
nRBC: 0 % (ref 0.0–0.2)

## 2019-09-03 LAB — COMPREHENSIVE METABOLIC PANEL
ALT: 19 U/L (ref 0–44)
AST: 26 U/L (ref 15–41)
Albumin: 3 g/dL — ABNORMAL LOW (ref 3.5–5.0)
Alkaline Phosphatase: 62 U/L (ref 38–126)
Anion gap: 9 (ref 5–15)
BUN: <5 mg/dL — ABNORMAL LOW (ref 8–23)
CO2: 32 mmol/L (ref 22–32)
Calcium: 9.1 mg/dL (ref 8.9–10.3)
Chloride: 98 mmol/L (ref 98–111)
Creatinine, Ser: 0.54 mg/dL (ref 0.44–1.00)
GFR calc Af Amer: >60 mL/min (ref >60)
GFR calc non Af Amer: >60 mL/min (ref >60)
Glucose, Bld: 190 mg/dL — ABNORMAL HIGH (ref 70–99)
Potassium: 3.6 mmol/L (ref 3.5–5.1)
Sodium: 139 mmol/L (ref 135–145)
Total Bilirubin: 0.7 mg/dL (ref 0.3–1.2)
Total Protein: 7.1 g/dL (ref 6.5–8.1)

## 2019-09-03 LAB — GLUCOSE, CAPILLARY
Glucose-Capillary: 135 mg/dL — ABNORMAL HIGH (ref 70–99)
Glucose-Capillary: 144 mg/dL — ABNORMAL HIGH (ref 70–99)
Glucose-Capillary: 142 mg/dL — ABNORMAL HIGH (ref 70–99)

## 2019-09-03 LAB — MAGNESIUM
Magnesium: 1.6 mg/dL — ABNORMAL LOW (ref 1.7–2.4)
Magnesium: 2.7 mg/dL — ABNORMAL HIGH (ref 1.7–2.4)

## 2019-09-03 LAB — BASIC METABOLIC PANEL
Anion gap: 10 (ref 5–15)
BUN: <5 mg/dL — ABNORMAL LOW (ref 8–23)
CO2: 34 mmol/L — ABNORMAL HIGH (ref 22–32)
Calcium: 9.2 mg/dL (ref 8.9–10.3)
Chloride: 98 mmol/L (ref 98–111)
Creatinine, Ser: 0.49 mg/dL (ref 0.44–1.00)
GFR calc Af Amer: >60 mL/min (ref >60)
GFR calc non Af Amer: >60 mL/min (ref >60)
Glucose, Bld: 132 mg/dL — ABNORMAL HIGH (ref 70–99)
Potassium: 2.8 mmol/L — ABNORMAL LOW (ref 3.5–5.1)
Sodium: 142 mmol/L (ref 135–145)

## 2019-09-03 LAB — PHENYTOIN LEVEL, TOTAL: Phenytoin Lvl: 13.4 ug/mL (ref 10.0–20.0)

## 2019-09-03 MED ORDER — LABETALOL HCL 5 MG/ML IV SOLN
INTRAVENOUS | Status: AC
  Filled 2019-09-03: qty 4

## 2019-09-03 MED ORDER — LABETALOL HCL 5 MG/ML IV SOLN
5.0000 mg | Freq: Once | INTRAVENOUS | Status: AC
  Administered 2019-09-03: 5 mg via INTRAVENOUS

## 2019-09-03 MED ORDER — MAGNESIUM SULFATE 4 GM/100ML IV SOLN
4.0000 g | Freq: Once | INTRAVENOUS | Status: AC
  Administered 2019-09-03: 4 g via INTRAVENOUS
  Filled 2019-09-03 (×2): qty 100

## 2019-09-03 MED ORDER — LORAZEPAM 2 MG/ML IJ SOLN
2.0000 mg | Freq: Three times a day (TID) | INTRAMUSCULAR | Status: DC
  Administered 2019-09-04: 2 mg via INTRAVENOUS
  Filled 2019-09-03: qty 1

## 2019-09-03 MED ORDER — CLONAZEPAM 0.25 MG PO TBDP: 0.5000 mg | ORAL_TABLET | Freq: Two times a day (BID) | ORAL | Status: DC

## 2019-09-03 MED ORDER — LABETALOL HCL 5 MG/ML IV SOLN
5.0000 mg | Freq: Once | INTRAVENOUS | Status: DC
  Filled 2019-09-03: qty 4

## 2019-09-03 MED ORDER — LORAZEPAM 2 MG/ML IJ SOLN
2.0000 mg | Freq: Once | INTRAMUSCULAR | Status: AC
  Administered 2019-09-03: 2 mg via INTRAVENOUS
  Filled 2019-09-03: qty 1

## 2019-09-03 MED ORDER — SPIRONOLACTONE 12.5 MG HALF TABLET
12.5000 mg | ORAL_TABLET | Freq: Every day | ORAL | Status: DC
  Administered 2019-09-03 – 2019-09-08 (×2): 12.5 mg via ORAL
  Filled 2019-09-03 (×6): qty 1

## 2019-09-03 MED ORDER — LORAZEPAM BOLUS VIA INFUSION: 2.0000 mg | Freq: Three times a day (TID) | INTRAVENOUS | Status: DC

## 2019-09-03 MED ORDER — POTASSIUM CHLORIDE 10 MEQ/100ML IV SOLN
10.0000 meq | INTRAVENOUS | Status: AC
  Administered 2019-09-03 (×6): 10 meq via INTRAVENOUS
  Filled 2019-09-03 (×6): qty 100

## 2019-09-03 MED ORDER — SODIUM CHLORIDE 0.9 % IV SOLN
500.0000 mg | Freq: Once | INTRAVENOUS | Status: AC
  Administered 2019-09-04: 500 mg via INTRAVENOUS
  Filled 2019-09-03: qty 10

## 2019-09-03 MED ORDER — SODIUM CHLORIDE 0.9 % IV SOLN: 100.0000 mg | Freq: Two times a day (BID) | INTRAVENOUS | Status: DC

## 2019-09-03 MED ORDER — SODIUM CHLORIDE 0.9 % IV SOLN
200.0000 mg | Freq: Once | INTRAVENOUS | Status: DC
  Filled 2019-09-03: qty 20

## 2019-09-03 MED ORDER — PHENYTOIN SODIUM 50 MG/ML IJ SOLN
100.0000 mg | Freq: Three times a day (TID) | INTRAMUSCULAR | Status: DC
  Administered 2019-09-04 – 2019-09-09 (×16): 100 mg via INTRAVENOUS
  Filled 2019-09-03 (×18): qty 2

## 2019-09-03 MED ORDER — LACTATED RINGERS IV BOLUS
1000.0000 mL | Freq: Once | INTRAVENOUS | Status: AC
  Administered 2019-09-03: 21:00:00 1000 mL via INTRAVENOUS

## 2019-09-03 NOTE — Progress Notes (Addendum)
Paged for patient having episode of twitching with increased tachycardia and urination. Afterward, she had no response to painful stimuli. When seen earlier patient was withdrawing to physical stimuli.  At bedside patient is now withdrawing to pain in the RL and RUE but not on the left. She did moan to painful stimuli as well. HR has been intermittently tachycardic and is currently 109. Saturation 100%, and she is protecting her airway. EKG done shows resolution of AV block but is not loading to chart. CT head done an three hours ago without new abnormality. EEG done at 10pm showed epileptogenicity but no active seizure activity. Phenytoin level ordered earlier is 13.4.  Discussed with Dr. Cheral Marker and will load 10 mg/kg fosphenytoin and increase phenytoin to 100 mg q12h. 2mg  IV ativan given now. Cont. keppra 1500 mg bid. LTM EEG will be done as well.   Molli Hazard A, DO 09/03/2019, 11:35 PM Pager: [phone]

## 2019-09-03 NOTE — Progress Notes (Signed)
  Subjective:  Patient states that she heard about something on the radio and is worried about the medical team hurting her. Patient alert but appears confused during encounter.  Objective:   Vital Signs (last 24 hours): Vitals:   09/03/19 0000 09/03/19 0216 09/03/19 0400 09/03/19 0510  BP: (!) 186/81  (!) 185/95 (!) 179/110  Pulse: 95 92 81 (!) 131  Resp: 19  15 18   Temp:   98.2 F (36.8 C)   TempSrc: Other (Comment)  Oral   SpO2: 94%  97% 94%  Weight:   53.6 kg   Height:        Physical Exam: General Alert and responds to questions, resting comfortably in bed  Pulmonary Breathing comfortably on room air, no cough, no distress  Abdominal Soft, non-tender, without distention  Extremities No peripheral edema   BMP Latest Ref Rng & Units 09/03/2019 09/02/2019 08/31/2019  Glucose 70 - 99 mg/dL 132(H) 140(H) 120(H)  BUN 8 - 23 mg/dL <5(L) 5(L) 12  Creatinine 0.44 - 1.00 mg/dL 0.49 0.39(L) 0.48  BUN/Creat Ratio 12 - 28 - - -  Sodium 135 - 145 mmol/L 142 141 141  Potassium 3.5 - 5.1 mmol/L 2.8(L) 2.9(L) 4.7  Chloride 98 - 111 mmol/L 98 99 104  CO2 22 - 32 mmol/L 34(H) 33(H) 24  Calcium 8.9 - 10.3 mg/dL 9.2 9.2 8.7(L)   Magnesium: 1.6   Assessment/Plan:   Principal Problem:   Status epilepticus (HCC) Active Problems:   Hypokalemia   Vascular dementia, uncomplicated (HCC)   Pressure injury of skin  Patient is an 79 year old female with past medical history significant for vascular dementia, essential hypertension, major depressive disorder, seizure disorder who presented from [facility] on 09/03/2019 after having approximately 15-minute seizure.  # Seizure disorder: On arrival to ED, patient was given Keppra, fosphenytoin, and Ativan.  Initial EEG showed evidence of epileptogenic activity in right frontoparietal region.  CT head demonstrated new CSF fluid collection in the left frontoparietal convexity with a slight rightward midline shift.  Continuous EEG demonstrated brief  ictal-interictal rhythmic discharges in right frontotemporal region. *Neurology following, we appreciate their continued recommendations *Keppra 1500 mg every 12 hours *Phenytoin 80 mg every 8 hours - reduced yesterday by neurology. Patient more awake today.  *Plan to switch AEDs to oral tomorrow if patient tolerate lunch/dinner *Ativan 1 mg every 2 hours as needed for seizure *SLP evaluated, started on dysphagia 1 diet  # Hypokalemia: Patient with chronic hypokalemia. Potassium of 2.8 today, repleting. 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 Patient not surgical candidate and would regardless benefit from spironolactone therapy - will hold off on workup and treat empirically with spironolactone *Spironolactone 12.5 mg daily  # Hypertension: Starting spironolactone 12.5 mg daily today  PT/OT: Consulted Diet: Dysphagia 1 diet, on mIVF D5-1/2NS at 100 cc/hr - will d/c when adequate PO intake DVT Ppx: Lovenox 40 mg daily Admit Status: Inpatient Dispo: Anticipated discharge pending clinical improvement  Jeanmarie Hubert, MD 09/03/2019, 5:53 AM

## 2019-09-03 NOTE — Progress Notes (Signed)
HR sustaining 130s w/ high BPs, gave x1 dose 5mg  labetalol, HR now 80s w/ 0600 BP 168/100. Pt resting in bed will continue to monitor.

## 2019-09-03 NOTE — Evaluation (Addendum)
Occupational Therapy Evaluation Patient Details Name: Amanda Roman MRN: [id number] DOB: [birth year] Today's Date: 09/03/2019    History of Present Illness 79yo female presenting from [place] with seizure like activity. 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 EEG with epileptogenicity in R temporal region. Admitted with status epilepticus and acute encephalopathy. PMH osteopenia, obeisty, HLD, HTN, hernia repair   Clinical Impression   Pt is at max - total A with ADLs/selfcare, most likely due to cognitive impairments. Pt is a LTC resident of [facility] and suspect current level of function is baseline. Pt with flat affect and not very talkative, but mumbling some words throughout session. HR elevated to 125BPM  at rest. Unsure of prior levels of mobility/assist at SNF. Pt not appropriate for any further acute OT services at this time time and will return to SNF when medically ready.    Follow Up Recommendations  SNF;Supervision/Assistance - 24 hour    Equipment Recommendations  None recommended by OT    Recommendations for Other Services       Precautions / Restrictions Precautions Precautions: Fall Restrictions Weight Bearing Restrictions: No      Mobility Bed Mobility               General bed mobility comments: physically resisted  Transfers                 General transfer comment: deferred- elevated HR    Balance                                           ADL either performed or assessed with clinical judgement   ADL Overall ADL's : Needs assistance/impaired Eating/Feeding: Total assistance Eating/Feeding Details (indicate cue type and reason): has AROM to feed herself, impaired cognitively, hand over hand max A Grooming: Wash/dry face Grooming Details (indicate cue type and reason): impaired cognitively, hand over hand max A Upper Body Bathing: Total  assistance   Lower Body Bathing: Total assistance   Upper Body Dressing : Total assistance   Lower Body Dressing: Total assistance     Toilet Transfer Details (indicate cue type and reason): unable to assess, facility may be using mechanical lift Toileting- Clothing Manipulation and Hygiene: Total assistance         General ADL Comments: total A, most likely due to cognitive impairments     Vision Baseline Vision/History: (unable to properly assess due to cognition)       Perception     Praxis      Pertinent Vitals/Pain Pain Assessment: Faces Pain Score: 0-No pain Faces Pain Scale: No hurt Pain Intervention(s): Monitored during session     Hand Dominance Right   Extremity/Trunk Assessment Upper Extremity Assessment Upper Extremity Assessment: Generalized weakness   Lower Extremity Assessment Lower Extremity Assessment: Defer to PT evaluation   Cervical / Trunk Assessment Cervical / Trunk Assessment: Kyphotic   Communication Communication Communication: Expressive difficulties(mumbles)   Cognition Arousal/Alertness: Awake/alert Behavior During Therapy: Flat affect Overall Cognitive Status: History of cognitive impairments - at baseline                                 General Comments: not very talkative today, difficulty following commands and inconsistent. Awake, alert, looks at  therapists and mumbles words but not following commands   General Comments  unable to assess    Exercises     Shoulder Instructions      Home Living Family/patient expects to be discharged to:: Skilled nursing facility                                 Additional Comments: return to [facility]- unsure of mobility or devices, or level of assist      Prior Functioning/Environment          Comments: unsure of devices or levels of assist, suspect total A with ADLs/selfcare and self feeding assist perhaps as well        OT Problem List:  Decreased strength;Decreased activity tolerance;Decreased knowledge of use of DME or AE;Decreased cognition      OT Treatment/Interventions:      OT Goals(Current goals can be found in the care plan section) Acute Rehab OT Goals Patient Stated Goal: none stated OT Goal Formulation: Patient unable to participate in goal setting  OT Frequency:     Barriers to D/C:            Co-evaluation              AM-PAC OT "6 Clicks" Daily Activity     Outcome Measure Help from another person eating meals?: Total Help from another person taking care of personal grooming?: A Lot Help from another person toileting, which includes using toliet, bedpan, or urinal?: Total Help from another person bathing (including washing, rinsing, drying)?: Total Help from another person to put on and taking off regular upper body clothing?: Total Help from another person to put on and taking off regular lower body clothing?: Total 6 Click Score: 7   End of Session    Activity Tolerance: Other (comment)(limited by cognitive impairments) Patient left: in bed;with call bell/phone within reach  OT Visit Diagnosis: Other abnormalities of gait and mobility (R26.89);Muscle weakness (generalized) (M62.81);Other symptoms and signs involving cognitive function                Time: [id number] OT Time Calculation (min): 15 min Charges:  OT General Charges $OT Visit: 1 Visit OT Evaluation $OT Eval Moderate Complexity: 1 Mod    Britt Bottom 09/03/2019, 2:21 PM

## 2019-09-03 NOTE — Progress Notes (Signed)
MD paged regarding pt BP 172/119 (136). No new orders received at this time. Will continue to monitor.  Clyde Canterbury, RN

## 2019-09-03 NOTE — Progress Notes (Signed)
HR sustaining 135. MD paged. Orders received for EKG and manual BP to be obtained.  Will continue to monitor.   Clyde Canterbury, RN

## 2019-09-03 NOTE — Progress Notes (Signed)
The patient had another seizure after EEG was completed, followed by postictal state.    A/R: -- LTM EEG has been ordered. -- Fosphenytoin supplemental load 10 mg/kg PE IV x 1.  -- Increasing scheduled Dilantin back to 100 mg IV TID -- Cannot tolerate PO Klonopin which was just ordered but not given, for seizure control. Have changed to 2 mg IV Ativan q8h scheduled.  -- Continue Keppra at 1500 mg BID -- In summary, her anticonvulsant regimen now consists of scheduled IV Dilantin, Keppra and Ativan.  -- Case discussed with primary team (Dr. Sharon Seller)  Electronically signed: Dr. Kerney Elbe

## 2019-09-03 NOTE — Evaluation (Signed)
Physical Therapy Evaluation Patient Details Name: Amanda Roman MRN: [id number] DOB: [birth year] Today's Date: 09/03/2019   History of Present Illness  79yo female presenting from [place] with seizure like activity. 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 EEG with epileptogenicity in R temporal region. Admitted with status epilepticus and acute encephalopathy. PMH osteopenia, obeisty, HLD, HTN, hernia repair  Clinical Impression   Patient received in bed, flat affect and only inconsistently following cues. Difficult to assess true strength due to cognition limitations. Did not talk to [place] much today except for a few words here and there and even then difficult to understand at times. Physically resisted movement of BLEs in bed, did not attempt EOB due to physical resistance and HR elevated to 125BPM just at rest. Unsure of prior levels of mobility/assist at [facility]. Definitely appropriate for return to SNF.     Follow Up Recommendations SNF;Supervision/Assistance - 24 hour(return to [facility])    Equipment Recommendations  Other (comment)(defer)    Recommendations for Other Services       Precautions / Restrictions Precautions Precautions: Fall Restrictions Weight Bearing Restrictions: No      Mobility  Bed Mobility               General bed mobility comments: physically resisted  Transfers                 General transfer comment: deferred- elevated HR  Ambulation/Gait             General Gait Details: deferred- elevated HR  Stairs            Wheelchair Mobility    Modified [place] (Stroke Patients Only)       Balance                                             Pertinent Vitals/Pain Pain Assessment: Faces Pain Score: 0-No pain Faces Pain Scale: No hurt Pain Intervention(s): Limited activity within patient's tolerance;Monitored during session     Home Living Family/patient expects to be discharged to:: Skilled nursing facility                 Additional Comments: return to [facility]- unsure of mobility or devices, or level of assist    Prior Function           Comments: unsure of devices or levels of assist     Hand Dominance        Extremity/Trunk Assessment   Upper Extremity Assessment Upper Extremity Assessment: Defer to OT evaluation    Lower Extremity Assessment Lower Extremity Assessment: Generalized weakness    Cervical / Trunk Assessment Cervical / Trunk Assessment: Kyphotic  Communication      Cognition Arousal/Alertness: Awake/alert Behavior During Therapy: Flat affect Overall Cognitive Status: History of cognitive impairments - at baseline                                 General Comments: not very talkative today, difficulty following commands and inconsistent with this. Physically resisted moving limbs in bed.      General Comments General comments (skin integrity, edema, etc.): did not get to EOB to assess balance    Exercises     Assessment/Plan  PT Assessment Patient needs continued PT services  PT Problem List Decreased strength;Decreased cognition;Decreased knowledge of use of DME;Decreased activity tolerance;Decreased balance;Decreased mobility       PT Treatment Interventions DME instruction;Balance training;Gait training;Functional mobility training;Patient/family education;Therapeutic activities;Therapeutic exercise    PT Goals (Current goals can be found in the Care Plan section)  Acute Rehab PT Goals PT Goal Formulation: Patient unable to participate in goal setting Time For Goal Achievement: 09/17/19 Potential to Achieve Goals: Fair    Frequency Min 2X/week   Barriers to discharge        Co-evaluation               AM-PAC PT "6 Clicks" Mobility  Outcome Measure Help needed turning from your back to your side while in a flat bed  without using bedrails?: A Little Help needed moving from lying on your back to sitting on the side of a flat bed without using bedrails?: A Lot Help needed moving to and from a bed to a chair (including a wheelchair)?: A Lot Help needed standing up from a chair using your arms (e.g., wheelchair or bedside chair)?: A Lot Help needed to walk in hospital room?: Total Help needed climbing 3-5 steps with a railing? : Total 6 Click Score: 11    End of Session   Activity Tolerance: Patient tolerated treatment well;Other (comment)(physically resisting, elevated HR) Patient left: in bed;with bed alarm set;with call bell/phone within reach   PT Visit Diagnosis: Other abnormalities of gait and mobility (R26.89);Difficulty in walking, not elsewhere classified (R26.2);Muscle weakness (generalized) (M62.81)    Time: [id number] PT Time Calculation (min) (ACUTE ONLY): 15 min   Charges:   PT Evaluation $PT Eval Moderate Complexity: 1 Mod          Windell Norfolk, DPT, PN1   Supplemental Physical Therapist [place]    Pager [phone] Acute Rehab Office [phone]

## 2019-09-03 NOTE — Progress Notes (Signed)
Subjective: Patient has had elevated blood pressures overnight.  She is more awake, saying words but not necessarily following commands.    ROS: Unable to obtain due to poor mental status  Examination  Vital signs in last 24 hours: Temp:  [98.2 F (36.8 C)-98.6 F (37 C)] 98.5 F (36.9 C) (02/24 1114) Pulse Rate:  [77-131] 119 (02/24 1114) Resp:  [15-22] 20 (02/24 1114) BP: (140-186)/(72-128) 171/116 (02/24 1114) SpO2:  [90 %-100 %] 90 % (02/24 1114) Weight:  [53.6 kg] 53.6 kg (02/24 0400)  General: lying in bed, not in apparent distress CVS: pulse-normal rate and rhythm RS: breathing comfortably, CTA B Extremities: normal, warm  Neuro: MS: Awake, alert, looks at the examiner and mumbles words but not following commands  CN: pupils equal and reactive, blinks to threat bilaterally, tracks examiner in the room, no apparent facial asymmetry  Motor: 5/5 strength in all 4 extremities Reflexes: Voluntarily appears to be moving all 4 extremities in bed  Basic Metabolic Panel: Recent Labs  Lab 08/30/19 1840 08/30/19 1840 08/30/19 1850 08/31/19 0317 08/31/19 0317 08/31/19 1215 09/02/19 0239 09/03/19 0801  NA 143   < > 142 143  --  141 141 142  K 2.5*   < > 3.4* 3.2*  --  4.7 2.9* 2.8*  CL 96*  --   --  101  --  104 99 98  CO2 36*  --   --  32  --  24 33* 34*  GLUCOSE 211*  --   --  159*  --  120* 140* 132*  BUN 9  --   --  10  --  12 5* <5*  CREATININE 0.69  --   --  0.71  --  0.48 0.39* 0.49  CALCIUM 10.2   < >  --  9.2   < > 8.7* 9.2 9.2  MG 1.8  --   --   --   --   --   --  1.6*   < > = values in this interval not displayed.    CBC: Recent Labs  Lab 08/30/19 1837 08/30/19 1850 08/31/19 0317 09/02/19 0239  WBC 12.7*  --  9.8 5.9  NEUTROABS  --   --  8.5*  --   HGB 14.5 13.6 11.6* 10.8*  HCT 44.8 40.0 35.6* 33.4*  MCV 92.8  --  93.0 92.3  PLT 326  --  260 220     Coagulation Studies: No results for input(s): LABPROT, INR in the last 72 hours.  Imaging No  new brain imaging    ASSESSMENT AND PLAN: 79 year old female with breakthrough seizures in the setting of weaning Ativan.She had a similar presentation with prolonged partial status epilepticus a year ago. At that time, discussions regarding her suppression were had with family, but she has made it clear that she never wants to have a breathing tubewhich has been reconfirmed during this admission by Dr. Leonel Ramsay.  Status epilepticus (resolved) Epilepsy with breakthrough seizures Acute toxic-metabolic, postictal encephalopathy -Total phenytoin level 20.9, collected at 0239 am, next dose of dilantin was at 0600 -Status epilepticus in the setting of weaning Ativan. Acute encephalopathy likely secondary to status epilepticus as well as antiepileptic drugs  Recommendations - Continue current dose of AEDs.  Okay to switch to p.o. now that patient has a diet.  Keppra can be crushed and given with applesauce as well as immediate release Dilantin can be crushed. -Phenytoin trough level will be checked on 09/07/2019.  Will only  make adjustments in dose if patient appears to have any side effects.  -Continue seizure precautions,delirium precautions, minimize sedating medications -As needed IV Ativan 1 mg for generalized tonic-clonic seizure lasting more than 2 minutes or focal seizure lasting more than 5 minutes. Using lower dose to minimize sedation and patient was DNR. -Patient has advanced dementia and poor neurological baseline.  Therefore anticipate patient will require longer period for recovery. - PT/OT for rehab -Patient is on Keppra 1500 mg twice daily which can cause agitation.  Recommend follow-up with neurology in 6 to 8 weeks after discharge.  At that time if patient continues to be seizure-free can consider reducing the dose gradually  Thank you for allowing [place] to participate in the care of this patient. Neurology will follow peripherally. Please page neuro hospitalist for any  further questions after 5 PM.  I have spent a total of25 minuteswith the patient reviewing hospitalnotes,  test results, labs and examining the patient as well as establishing an assessment and plan that was discussed personally with the patient's physician, nurse.>50% of time was spent in direct patient care.   Brighid Koch [name]

## 2019-09-03 NOTE — Progress Notes (Signed)
Pt more awake and subsequently slightly more combative. She would not allow NT or RN to take vitals or check blood sugar. BP cuff reading high as pt will not keep arm still. She is only alert to self. Other vitals are stable will continue to monitor.

## 2019-09-03 NOTE — Significant Event (Addendum)
Rapid Response Event Note  Overview: Called by NT d/t MD request for RRT d/t unresponsiveness Time Called: 1944 Arrival Time: 1946 Event Type: Neurologic  Initial Focused Assessment: Pt laying in bed with eyes closed, does not respond to verbal stimulation. Pt will move arms and grimace when painful stimulation applied to all extremities. When mouth suctioned, pt will reach for mouth with L hand. Pupils 2 and slugglish. Skin cool to touch. T-97.9, HR-130s(ST), BP-154/97, RR-25, SpO2-93% on RA, CBG-142.  Dr. Gilford Rile and Chundi at bedside on arrival.  Interventions: CBG-142 CMP/Mg/CBC-all WNL ABG-7.44/51.5/128/34.8 Lindzen(neuro) to bedside to assess pt.  2mg  ativan given at 2010 CT head STAT-no acute changes EEG-This studyshowed evidence of epileptogenicity in right frontotemporal region. Additionally, there is evidence of severe diffuse encephalopathy, non specific to etiology, but could be secondary to medications.  2030-BP-66/57-1LR bolus given-BP up to 106/69, HR fluctuating between 80s and 130s and pt having periods of tachypnea and bradypnea.   Plan of Care (if not transferred): Neuro aware of EEG results and is adjusting meds. BP better after bolus. Continue to monitor pt. Call RRT if further assistance needed.  Event Summary: Name of Physician Notified: Dr. Loren Racer at (PTA RRT)   End: 2115 Outcome: stayed on unit and stabilized       [place], Carren Rang

## 2019-09-03 NOTE — TOC Transition Note (Signed)
Transition of Care [facility]) - CM/SW Discharge Note   Patient Details  Name: Amanda Roman MRN: [id number] Date of Birth: Aug 20, 1940  Transition of Care [facility]) CM/SW Contact:  Vinie Sill, [place] Phone Number: 09/03/2019, 11:19 AM   Clinical Narrative:     CSW spoke with patient's sister Amanda Roman(POA),via phone. CSW introduced self and explained role. Patient's sister confirmed the patient was from [facility]. She reports she has been there since March or April of last year. She states patient's health care is also managed by [facility]. Patient is expected to return to [facility] when medically stable.  CSW will continue to follow.  Thurmond Butts, MSW, [place] Clinical Social Worker   Final next level of care: [place]     Patient Goals and CMS Choice        Discharge Placement                       Discharge Plan and Services In-house Referral: Clinical Social Work                                   Social Determinants of Health (SDOH) Interventions     Readmission Risk Interventions No flowsheet data found.
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
## 2019-09-03 NOTE — Procedures (Signed)
Patient Name: JONNETTE STOCKETT  MRN: [id number]  Epilepsy Attending: Lora Havens  Referring Physician/Provider: Dr Kerney Elbe Date: 09/03/2019  Duration: 23.07 mins  Patient history: 79 yo F with epilepsy who presented with status epilepticus which improved, now with another episode of unresponsiveness and left UE twitching . EEG to evaluate for seizure.   Level of alertness: lethargic  AEDs during EEG study: LEV, PHT, Ativan  Technical aspects: This EEG study was done with scalp electrodes positioned according to the 10-20 International system of electrode placement. Electrical activity was acquired at a sampling rate of 500Hz  and reviewed with a high frequency filter of 70Hz  and a low frequency filter of 1Hz . EEG data were recorded continuously and digitally stored.   DESCRIPTION: EEG showed continuous generalized 3-6Hz  theta-delta slowing admixed with 15 to 18 Hz, 2-3 uV beta activity in frontocentral regions. Sharp waves were also noted in right frontotemporal region.  Hyperventilation and photic stimulation were not performed.  ABNORMALITY - Sharp waves, right frontotemporal region - Continuous slow, generalized  IMPRESSION: This study showed evidence of epileptogenicity in right frontotemporal region. Additionally, there is evidence of severe diffuse encephalopathy, non specific to etiology, but could be secondary to medications.  Dr Cheral Marker was notified.   Chidubem Chaires [name]

## 2019-09-03 NOTE — Progress Notes (Signed)
Patient seen at the bedside after being paged for tachycardia to 140s and hypertension to the 180s/110s.  Patient was in sustained sinus tachycardia according to the telemetry.  At the bedside, the patient appeared alert but was not oriented.  She repeatedly stated "I have never seen you before. This is the first time of seeing you. Ya'll just want to keep me here until I die." Reassured the patient I was trying to help her.  I attempted to inquire about palpitations and chest pain, but the patient ignore my questions and continue to repeat herself.  She appeared comfortable while laying in bed. On cardiac exam, she had normal S1-S2, no murmurs rubs or gallops appreciated.  Lungs were clear bilaterally.  Although the patient appears to be asymptomatic, given the degree of her hypertension I will order IV labetalol 5 mg once.  Earlene Plater, MD Internal Medicine, PGY1 Pager: [phone]  09/03/2019,5:55 AM

## 2019-09-03 NOTE — Progress Notes (Signed)
Stat  EEG complete - results pending.  

## 2019-09-03 NOTE — Progress Notes (Addendum)
IMTS services received page about patient with tachycardia. Team went to bedside for evaluation. Patient was found at bedside to be sleeping, she responded to verbal and physical stimuli, but was mumbling non coherent language. Her blood pressure was in the 170s/110s, and her heart rate was sustained at 120-125 throughout the examination.   Her pulses were intact and in regular rhythm, no cardiac murmurs, rubs, or gallops on ascultation. Lungs were clear bilaterally. Abdomen with normal bowel sounds. No pitting edema in the lower limbs appreciated.  Plan:  -EKG -manual BP  - IV Labetalol 5mg  IV  Addendum:  Patient readdressed with the entire IMTS team. Patient found unresponsive, without pain or withdrawal from sternal rub, with minimal corneal reflex. Patient with sustained tachycardia with pressures in the 170s/110s. Code blue and Neurology was paged due to unresponsiveness. Neurology and rapid response  came to bedside for evaluation.   Updated family over the phone. DNR, DNI, CAN give pressors.  Plan: - Stat ABG, CMP, CBC, CBG - Head CT WO Contrast - EEG - 2mg  IV Ativan  Maudie Mercury, MD IMTS, PGY-1 09/03/2019,7:28 PM

## 2019-09-03 NOTE — Progress Notes (Signed)
Called for unresponsiveness. Exam shows no eye deviation, but there is intermittent subtle LUE twitching. She furrows brow and moves RUE slightly to toe compression but not plantar stimulation or sternal rub. Will not open eyes to voice or noxious.   Currently on Dilantin and Keppra.   Obtaining STAT CT head and EEG. 2 mg IV Ativan being administered.   Electronically signed: Dr. Kerney Elbe

## 2019-09-03 NOTE — Progress Notes (Signed)
Dr. Benjamine Mola paged regarding pt BP 171/116 (130). No new orders received at this time.  Clyde Canterbury, RN

## 2019-09-04 ENCOUNTER — Inpatient Hospital Stay (HOSPITAL_COMMUNITY): Payer: Medicare (Managed Care)

## 2019-09-04 LAB — RENAL FUNCTION PANEL
Albumin: 2.5 g/dL — ABNORMAL LOW (ref 3.5–5.0)
Anion gap: 11 (ref 5–15)
BUN: <5 mg/dL — ABNORMAL LOW (ref 8–23)
CO2: 31 mmol/L (ref 22–32)
Calcium: 8.9 mg/dL (ref 8.9–10.3)
Chloride: 101 mmol/L (ref 98–111)
Creatinine, Ser: 0.48 mg/dL (ref 0.44–1.00)
GFR calc Af Amer: >60 mL/min (ref >60)
GFR calc non Af Amer: >60 mL/min (ref >60)
Glucose, Bld: 94 mg/dL (ref 70–99)
Phosphorus: 1.4 mg/dL — ABNORMAL LOW (ref 2.5–4.6)
Potassium: 3.2 mmol/L — ABNORMAL LOW (ref 3.5–5.1)
Sodium: 143 mmol/L (ref 135–145)

## 2019-09-04 LAB — GLUCOSE, CAPILLARY
Glucose-Capillary: 102 mg/dL — ABNORMAL HIGH (ref 70–99)
Glucose-Capillary: 122 mg/dL — ABNORMAL HIGH (ref 70–99)
Glucose-Capillary: 82 mg/dL (ref 70–99)
Glucose-Capillary: 87 mg/dL (ref 70–99)

## 2019-09-04 LAB — MAGNESIUM: Magnesium: 2.1 mg/dL (ref 1.7–2.4)

## 2019-09-04 MED ORDER — LORAZEPAM 2 MG/ML IJ SOLN
1.0000 mg | Freq: Three times a day (TID) | INTRAMUSCULAR | Status: DC
  Administered 2019-09-04 – 2019-09-06 (×6): 1 mg via INTRAVENOUS
  Filled 2019-09-04 (×6): qty 1

## 2019-09-04 MED ORDER — POTASSIUM PHOSPHATES 15 MMOLE/5ML IV SOLN
30.0000 mmol | Freq: Once | INTRAVENOUS | Status: AC
  Administered 2019-09-04: 30 mmol via INTRAVENOUS
  Filled 2019-09-04: qty 10

## 2019-09-04 MED ORDER — DEXTROSE-NACL 5-0.45 % IV SOLN: INTRAVENOUS | Status: DC

## 2019-09-04 NOTE — Progress Notes (Signed)
Subjective: Patient had focal seizures last night requiring Ativan.  ROS: Unable to obtain due to poor mental status  Examination  Vital signs in last 24 hours: Temp:  [97.4 F (36.3 C)-98.4 F (36.9 C)] 98.1 F (36.7 C) (02/25 1200) Pulse Rate:  [73-135] 79 (02/25 1200) Resp:  [10-27] 23 (02/25 1200) BP: (66-180)/(57-126) 101/79 (02/25 1200) SpO2:  [94 %-100 %] 100 % (02/25 1200) Weight:  [57 kg] 57 kg (02/25 0500)  General: lying in bed, not in apparent distress CVS: pulse-normal rate and rhythm RS: breathing comfortably, CTA B Extremities: normal, warm  Neuro: MS: Comatose, does not open eyes to noxious stimuli, does not follow commands CN: Pupils myotic, difficult to see any reactivity, Bell's phenomena with eyes deviated upwards (likely secondary to sedation), no forced gaze deviation, corneal reflex intact Motor: Withdraws to noxious stimuli in all 4 extremities  Basic Metabolic Panel: Recent Labs  Lab 08/30/19 1840 08/30/19 1850 08/31/19 1215 08/31/19 1215 09/02/19 0239 09/02/19 0239 09/03/19 0801 09/03/19 1948 09/04/19 0247  NA 143   < > 141  --  141  --  142 139 143  K 2.5*   < > 4.7  --  2.9*  --  2.8* 3.6 3.2*  CL 96*   < > 104  --  99  --  98 98 101  CO2 36*   < > 24  --  33*  --  34* 32 31  GLUCOSE 211*   < > 120*  --  140*  --  132* 190* 94  BUN 9   < > 12  --  5*  --  <5* <5* <5*  CREATININE 0.69   < > 0.48  --  0.39*  --  0.49 0.54 0.48  CALCIUM 10.2   < > 8.7*   < > 9.2   < > 9.2 9.1 8.9  MG 1.8  --   --   --   --   --  1.6* 2.7* 2.1  PHOS  --   --   --   --   --   --   --   --  1.4*   < > = values in this interval not displayed.    CBC: Recent Labs  Lab 08/30/19 1837 08/30/19 1850 08/31/19 0317 09/02/19 0239 09/03/19 1948  WBC 12.7*  --  9.8 5.9 5.5  NEUTROABS  --   --  8.5*  --   --   HGB 14.5 13.6 11.6* 10.8* 14.0  HCT 44.8 40.0 35.6* 33.4* 41.5  MCV 92.8  --  93.0 92.3 90.2  PLT 326  --  260 220 248     Coagulation Studies: No  results for input(s): LABPROT, INR in the last 72 hours.  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  No acute change.  Stable mild rightward midline shift.  No acute infarct or hemorrhage.  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 At that time, discussions regarding her suppression were had with family, but she has made it clear that she never wants to have a breathing tubewhich has been reconfirmed during this admission by Dr. Leonel Ramsay.  Status epilepticus (resolved) Epilepsy with breakthrough seizures Acute toxic-metabolic, postictal encephalopathy -Status epilepticus in the setting of weaning Ativan. Acute encephalopathy likely secondary to status epilepticus as well as antiepileptic drugs  Recommendations -  Continue Keppra 1500 mg twice daily, Dilantin 100 mg 3 times daily. Plan as patient is very sedated and no seizures on LTM, will reduce Ativan to 1 mg 3 times daily. -Patient is neurologically fragile.  Slight reduction in phenytoin led to breakthrough seizures even though phenytoin level was therapeutic.  Now with increased phenytoin and Ativan patient is very sedated. -I spoke with patient's Sister [name] and goal is to have patient awake, able to have p.o. intake.  With this goal in mind, we will very slowly reduce sedative antiseizure medications only to the point where patient is awake and seizure-free. -I also explained that our goal is to treat clinical seizures because treating subclinical seizures will likely not improve patient's quality of life at this point but will likely lead to more side effects including sedation -Discussed consulting palliative care to understand goals of care if patient starts having breakthrough seizures/goes into status epilepticus  again while we are slowly reducing antiepileptic drugs to minimize sedation. -Continue seizure precautions,delirium precautions, minimize sedating medications -As needed IV Ativan 1 mg for generalized tonic-clonic seizure lasting more than 2 minutes or focal seizure lasting more than 5 minutes. Using lower dose to minimize sedation and patient was DNR. -Patient has advanced dementia and poor neurological baseline. Therefore anticipate patient will require longer period for recovery.  Thank you for allowing [place] to participate in the care of this patient. Neurology will follow peripherally. Please page neuro hospitalist for any further questions after 5 PM.  I have spent a total of8minuteswith the patient reviewing hospitalnotes, test results, labs and examining the patient as well as establishing an assessment and plan that was discussed personally with the patient's sister, physician, nurse.>50% of time was spent in direct patient care.   Lekendrick Alpern [name]

## 2019-09-04 NOTE — Progress Notes (Signed)
LTM maint complete - no skin breakdown under:  Fp1, F7, A1

## 2019-09-04 NOTE — Progress Notes (Signed)
While setting up the patient for an EKG, the nurse noticed a lot of twitching and spasms in the patient's arms.  At the same time the patient started urinating and her heart rate went up to the 120s. BP was 155/121. This lasted for about 1-2 minutes.  When the twitching stopped her heart rate gradually started coming back down.  Pt was unresponsive to noise, sternal rubs, or pinching of her extremities.  Paged on call physicians, who are coming to assess the patient.  Lupita Dawn, RN

## 2019-09-04 NOTE — Progress Notes (Signed)
PT Cancellation Note  Patient Details Name: Amanda Roman MRN: [id number] DOB: 1941/05/18   Cancelled Treatment:    Reason Eval/Treat Not Completed: Medical issues which prohibited therapy;Fatigue/lethargy limiting ability to participate Per RN pt is non responsive and postictal. PT will continue to follow acutely.   Earney Navy, PTA Acute Rehabilitation Services Pager: [phone] Office: [phone]   09/04/2019, 8:48 AM

## 2019-09-04 NOTE — Procedures (Addendum)
Patient Name:Amanda Roman [id number] Epilepsy Attending:Natalie Leclaire [name] Referring Physician/Provider:Dr Kerney Elbe Duration: 09/03/2019 2359 to  09/04/2019 2359  Patient history:79 yo F with epilepsy who presented with status epilepticus which improved, now with another episode of unresponsiveness and left UE twitching . EEG to evaluate for seizure.  Level of alertness:lethargic  AEDs during EEG study:LEV, PHT, Ativan  Technical aspects: This EEG study was done with scalp electrodes positioned according to the 10-20 International system of electrode placement. Electrical activity was acquired at a sampling rate of 500Hz  and reviewed with a high frequency filter of 70Hz  and a low frequency filter of 1Hz . EEG data were recorded continuously and digitally stored.  DESCRIPTION: EEG showed continuous generalized 3-6Hz  theta-delta slowing admixed with 15 to 18 Hz, 2-3 uV beta activity in frontocentral regions.Sharp waves were also noted in right frontotemporal region. Hyperventilation and photic stimulation were not performed.  ABNORMALITY - Sharp waves, right frontotemporal region - Continuous slow, generalized  IMPRESSION: This studyshowed evidence of epileptogenicity in right frontotemporal region. Additionally, there is evidence of severe diffuse encephalopathy, non specific to etiology, but could be secondary to medications.No seizures were seen during this study.  Amanda Roman [name]

## 2019-09-04 NOTE — Progress Notes (Signed)
Subjective:  Patient seen at bedside, nonresponsive.   Objective:    Vital Signs (last 24 hours): Vitals:   09/04/19 0130 09/04/19 0312 09/04/19 0337 09/04/19 0500  BP: 105/69 106/76 119/80 110/86  Pulse: 73 76 75   Resp: (!) 25 20 18 18   Temp:  (!) 97.4 F (36.3 C)    TempSrc:  Oral    SpO2: 100% 100% 100%   Weight:    57 kg  Height:        Physical Exam: General Resting comfortably, no response to voice  Cardiac Regular rate and rhythm, no murmurs, rubs, or gallops  Pulmonary Clear to auscultation bilaterally without wheezes, rhonchi, or rales  Extremities No peripheral edema   ABG    Component Value Date/Time   PHART 7.445 09/03/2019 2013   PCO2ART 51.5 (H) 09/03/2019 2013   PO2ART 128 (H) 09/03/2019 2013   HCO3 34.8 (H) 09/03/2019 2013   TCO2 41 (H) 08/30/2019 1850   O2SAT 99.1 09/03/2019 2013    CMP Latest Ref Rng & Units 09/04/2019 09/03/2019 09/03/2019  Glucose 70 - 99 mg/dL 94 190(H) 132(H)  BUN 8 - 23 mg/dL <5(L) <5(L) <5(L)  Creatinine 0.44 - 1.00 mg/dL 0.48 0.54 0.49  Sodium 135 - 145 mmol/L 143 139 142  Potassium 3.5 - 5.1 mmol/L 3.2(L) 3.6 2.8(L)  Chloride 98 - 111 mmol/L 101 98 98  CO2 22 - 32 mmol/L 31 32 34(H)  Calcium 8.9 - 10.3 mg/dL 8.9 9.1 9.2  Total Protein 6.5 - 8.1 g/dL - 7.1 -  Total Bilirubin 0.3 - 1.2 mg/dL - 0.7 -  Alkaline Phos 38 - 126 U/L - 62 -  AST 15 - 41 U/L - 26 -  ALT 0 - 44 U/L - 19 -   Phosphors: 1.4  CT Head WO Contrast (09/03/2019): IMPRESSION: 1. 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 No acute hemorrhage. 2. Stable mild rightward midline shift. 3. No acute infarct or hemorrhage.  Assessment/Plan:   Principal Problem:   Status epilepticus ([place]) Active Problems:   Hypokalemia   Vascular dementia, uncomplicated (HCC)   Pressure injury of skin  Patient is an 79 year old female with past medical history significant for vascular dementia, essential hypertension, major depressive  disorder, seizure disorder who presented from [facility] on 09[facility]03/2019 after having approximately 15-minute seizure.  # Seizure disorder: On arrival to ED, patient was given Keppra, fosphenytoin, and Ativan.  Initial EEG showed evidence epileptogenic activity in right frontoparietal region.  CT head demonstrated new CSF fluid collection in the left frontoparietal convexity with a slight rightward midline shift.  Continuous EEG demonstrated brief ictal-interictal rhythmic discharges in right frontal temporal region.  Continuous EEG was discontinued on 09/01/2019.  Yesterday evening, patient had decline in mental status. Stat EEG showed evidence of a density in right frontotemporal region, continuous EEG monitoring was again ordered.  Supplemental fosphenytoin load provided, Dilantin was increased back to 100 mg IV 3 times daily.  Ativan 2 mg IV will schedule every 8 hours. The goal is to have patient awake and able to take oral intake. *Palliative care consulted for goals of care discussion with family.  Neurology is slowly titrating down patient's antiepileptic medications to try to have patient awake enough to be alert and able to take p.o. intake.  However, it would be helpful to understand goals in the event that patient has another seizure episode as we are down-titrating. *Keppra 1500 mg Q12HRs *Phenytoin 100 mg Q8HRs *Ativan 1 mg three  times daily  # Hypokalemia: Potasium 3.2 this AM, phosphorus of 1.4. Repleting with 30 mmol K Phos  # Hypertension: Continue spironolactone 12.5 mg daily  PT/OT: Evaluation not indicated at this time Diet: NPO, on D5-1/2NS at 100 cc/hr DVT Ppx: Lovenox 40 mg daily Admit Status: Inpatient Dispo: Anticipated discharge pending clinical improvement  Jeanmarie Hubert, MD 09/04/2019, 6:19 AM

## 2019-09-04 NOTE — Progress Notes (Signed)
SLP Cancellation Note  Patient Details Name: Amanda Roman MRN: [id number] DOB: February 10, 1941   Cancelled treatment:       Reason Eval/Treat Not Completed: Patient not medically ready(Case was discussed with RN and she reported that the pt is currently unresponsive. SLP will follow up on subsequent date.)  Jennah Satchell I. Hardin Negus, [place], [place] Office number [phone] Pager Blue 09/04/2019, 12:44 PM

## 2019-09-04 NOTE — Progress Notes (Signed)
vLTM started neurology notified   RN instructed on use of event button

## 2019-09-05 DIAGNOSIS — Z7189 Other specified counseling: Secondary | ICD-10-CM

## 2019-09-05 DIAGNOSIS — Z515 Encounter for palliative care: Secondary | ICD-10-CM

## 2019-09-05 LAB — GLUCOSE, CAPILLARY
Glucose-Capillary: 100 mg/dL — ABNORMAL HIGH (ref 70–99)
Glucose-Capillary: 104 mg/dL — ABNORMAL HIGH (ref 70–99)
Glucose-Capillary: 114 mg/dL — ABNORMAL HIGH (ref 70–99)
Glucose-Capillary: 146 mg/dL — ABNORMAL HIGH (ref 70–99)
Glucose-Capillary: 64 mg/dL — ABNORMAL LOW (ref 70–99)

## 2019-09-05 LAB — RENAL FUNCTION PANEL
Albumin: 2.5 g/dL — ABNORMAL LOW (ref 3.5–5.0)
Anion gap: 12 (ref 5–15)
BUN: <5 mg/dL — ABNORMAL LOW (ref 8–23)
CO2: 32 mmol/L (ref 22–32)
Calcium: 8.9 mg/dL (ref 8.9–10.3)
Chloride: 99 mmol/L (ref 98–111)
Creatinine, Ser: 0.63 mg/dL (ref 0.44–1.00)
GFR calc Af Amer: >60 mL/min (ref >60)
GFR calc non Af Amer: >60 mL/min (ref >60)
Glucose, Bld: 104 mg/dL — ABNORMAL HIGH (ref 70–99)
Phosphorus: 3.8 mg/dL (ref 2.5–4.6)
Potassium: 3.3 mmol/L — ABNORMAL LOW (ref 3.5–5.1)
Sodium: 143 mmol/L (ref 135–145)

## 2019-09-05 LAB — MAGNESIUM: Magnesium: 1.8 mg/dL (ref 1.7–2.4)

## 2019-09-05 MED ORDER — DEXTROSE 50 % IV SOLN
INTRAVENOUS | Status: AC
  Administered 2019-09-05: 25 mL
  Filled 2019-09-05: qty 50

## 2019-09-05 MED ORDER — POTASSIUM CHLORIDE 10 MEQ/100ML IV SOLN
10.0000 meq | INTRAVENOUS | Status: AC
  Administered 2019-09-05 (×4): 10 meq via INTRAVENOUS
  Filled 2019-09-05 (×4): qty 100
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
## 2019-09-05 NOTE — Progress Notes (Addendum)
Subjective: No acute events overnight.  Speaking with patient's nurse, it appears that patient was slightly more awake this morning however again fell asleep and has been difficult arouse since.  ROS: Unable to obtain due to poor mental status  Examination  Vital signs in last 24 hours: Temp:  [97.2 F (36.2 C)-98.8 F (37.1 C)] 98.7 F (37.1 C) (02/26 0801) Pulse Rate:  [75-96] 91 (02/26 0900) Resp:  [11-25] 22 (02/26 0900) BP: (101-160)/(73-101) 160/94 (02/26 0801) SpO2:  [98 %-100 %] 99 % (02/26 0900) Weight:  [59.3 kg] 59.3 kg (02/26 0400)  General: lying in bed, not in apparent distress CVS: pulse-normal rate and rhythm RS: breathing comfortably, CTA B Extremities: normal, warm  Neuro: MS: Comatose, says "ow" but does not open eyes to noxious stimuli, does not follow commands CN: Pupils equal and reactive to light, Bell's phenomena with eyes deviated upwards (likely secondary to sedation), no forced gaze deviation, corneal reflex intact Motor: Withdraws to noxious stimuli in all 4 extremities  Basic Metabolic Panel: Recent Labs  Lab 08/30/19 1840 08/30/19 1850 09/02/19 0239 09/02/19 0239 09/03/19 0801 09/03/19 0801 09/03/19 1948 09/04/19 0247 09/05/19 0339  NA 143   < > 141  --  142  --  139 143 143  K 2.5*   < > 2.9*  --  2.8*  --  3.6 3.2* 3.3*  CL 96*   < > 99  --  98  --  98 101 99  CO2 36*   < > 33*  --  34*  --  32 31 32  GLUCOSE 211*   < > 140*  --  132*  --  190* 94 104*  BUN 9   < > 5*  --  <5*  --  <5* <5* <5*  CREATININE 0.69   < > 0.39*  --  0.49  --  0.54 0.48 0.63  CALCIUM 10.2   < > 9.2   < > 9.2   < > 9.1 8.9 8.9  MG 1.8  --   --   --  1.6*  --  2.7* 2.1 1.8  PHOS  --   --   --   --   --   --   --  1.4* 3.8   < > = values in this interval not displayed.    CBC: Recent Labs  Lab 08/30/19 1837 08/30/19 1850 08/31/19 0317 09/02/19 0239 09/03/19 1948  WBC 12.7*  --  9.8 5.9 5.5  NEUTROABS  --   --  8.5*  --   --   HGB 14.5 13.6 11.6*  10.8* 14.0  HCT 44.8 40.0 35.6* 33.4* 41.5  MCV 92.8  --  93.0 92.3 90.2  PLT 326  --  260 220 248     Coagulation Studies: No results for input(s): LABPROT, INR in the last 72 hours.  Imaging No new brain imaging.  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 At that time, discussions regarding her suppression were had with family, but she has made it clear that she never wants to have a breathing tubewhich has been reconfirmed during this admission by Dr. Leonel Roman.  Status epilepticus (resolved) Epilepsy with breakthrough seizures Acute toxic-metabolic, postictal encephalopathy  -Status epilepticus in the setting of weaning Ativan. Acute encephalopathy likely secondary to status epilepticus as well as antiepileptic drugs -Patient is neurologically fragile.  Slight reduction in  phenytoin led to breakthrough seizures even though phenytoin level was therapeutic.  Now with increased phenytoin and Ativan patient is very sedated.   Recommendations -Continue Keppra 1500 mg twice daily, Dilantin 100 mg 3 times daily, Ativan 1 mg 3 times daily. -Palliative care team discussed goals of care with patient Sister Amanda Roman who reported that patient has been intermittently refusing medications at her nursing home. -Patient will need to be on antiepileptics consistently in order to avoid breakthrough seizures in future.  Alcohol during this admission is to have her awake without breakthrough seizures.  She is currently on 3 antiseizure medications which is most likely contributing to her excessive sedation.  However she is neurologically fragile and had breakthrough seizures once her phenytoin was lowered from 100 mg 3 times daily to 80 mg 3 times daily (even though phenytoin level was therapeutic at 13.4 when breakthrough seizures occurred).  -Given history  of refusing medications, my eventual goal will be to find an antiepileptic regimen with minimal antiepileptic drugs, once daily dosing, can be crushed and administered and long half-life which will help with compliance and minimize breakthrough seizures the patient refuses to or is unable to take any doses.  Such drugs would include clobazam ( Onfi) and permapanel ( Fycompa).  -Also, if patient continues to have breakthrough seizures due to medication noncompliance, it would be important to discuss the outcome of repeated breakthrough seizures on quality of life and develop a plan where patient can receive IV Ativan or rectal diazepam or intranasal midazolam for breakthrough seizures at her facility and minimize readmissions to the hospital. - will check with case manager if onfi and/or permapanel (for preventative therapy) and intranasal midazolam (for rescue therapy) will be covered by patient's insurance to minimize breakthrough seizures, improve compliance, minimize readmission and improve quality of care -Appreciate palliative care team's input and discussions with pace program in managing this very complicated patient  -Continue seizure precautions,delirium precautions, minimize sedating medications -As needed IV Ativan 1 mg for generalized tonic-clonic seizure lasting more than 2 minutes or focal seizure lasting more than 5 minutes. Using lower dose to minimize sedation and patient was DNR.   Thank you for allowing [place] to participate in the care of this patient. Neurology will followperipherally. Please page neuro hospitalist for any further questions after 5 PM.  I have spent a total of51minuteswith the patient reviewing hospitalnotes, test results, labs and examining the patient as well as establishing an assessment and plan that was discussed personally with the patient's physicians, nurse.>50% of time was spent in direct patient care.   Amanda Roman Amanda Roman

## 2019-09-05 NOTE — Progress Notes (Signed)
I responded to consult for spiritual support for [name]. Nurse stated that Amanda Roman is occasionally aware and her conversation in minimal. Upon entering, I noticed Amanda Roman was being treated by staff. After they left, I offered her space to communicate. I found it difficult to communicate with her. I was ministerially present with her. I will follow up with her at a later time.   Chaplain Resident Fidel Levy MA [phone]

## 2019-09-05 NOTE — Progress Notes (Addendum)
  Subjective:  Patient seen at bedside, opens eyes, more alert than before, still not verbally responsive.  Objective:    Vital Signs (last 24 hours): Vitals:   09/04/19 2200 09/04/19 2313 09/05/19 0000 09/05/19 0400  BP: 117/73 (!) 150/96 124/80 (!) 144/99  Pulse: 79 84  89  Resp: 18 (!) 23 17 (!) 25  Temp:  97.8 F (36.6 C)  98.8 F (37.1 C)  TempSrc:  Axillary  Axillary  SpO2: 100% 100% 100% 100%  Weight:    59.3 kg  Height:        Physical Exam: General Resting comfortably in bed, opens eyes, moves extremities  Cardiac Regular rate and rhythm, no murmurs, rubs, or gallops  Pulmonary Clear to auscultation bilaterally without wheezes, rhonchi, or rales  Extremities No peripheral edema   BMP Latest Ref Rng & Units 09/05/2019 09/04/2019 09/03/2019  Glucose 70 - 99 mg/dL 104(H) 94 190(H)  BUN 8 - 23 mg/dL <5(L) <5(L) <5(L)  Creatinine 0.44 - 1.00 mg/dL 0.63 0.48 0.54  BUN/Creat Ratio 12 - 28 - - -  Sodium 135 - 145 mmol/L 143 143 139  Potassium 3.5 - 5.1 mmol/L 3.3(L) 3.2(L) 3.6  Chloride 98 - 111 mmol/L 99 101 98  CO2 22 - 32 mmol/L 32 31 32  Calcium 8.9 - 10.3 mg/dL 8.9 8.9 9.1    Assessment/Plan:   Principal Problem:   Status epilepticus (HCC) Active Problems:   Hypokalemia   Vascular dementia, uncomplicated (HCC)   Pressure injury of skin  Patient is an 79 year old female with past medical history significant for vascular dementia, essential hypertension, major depressive disorder, seizure disorder who presented from [facility] on 09/03/2019 after having an approximately 15-minute seizure.  # Seizure disorder: On arrival, patient was given Keppra, fosphenytoin, and Ativan.  While clinical seizures were controlled, patient remains sedated.  With down-titration of antiepileptics, patient had repeat seizure activity on evening of 09/03/2019.  Antiepileptics were again increased, goal is for slow titration, improvement in mental status with control of seizures.   Palliative care consulted for goals of care discussion with family in the event patient has additional seizures with down titration. EEG with signs of improvement,  *Keppra 1500 mg every 12 hours *Phenytoin 100 mg every 8 hours *Ativan 1 mg 3 times daily (reduced yesterday from 2 mg) *TOC consulted to see if onfi, permapanel, midazolam will be covered by insurance *Palliative care consulted, discussing goals of care with family  # Hypokalemia: Hypokalemia has been improved since initiation of spironolactone. Potassium 3.3 today, will replete IV  # Hypophosphatemia: Phosphate of 3.8 today from 1.4 s/p repletion  # Hypertension: BP with good control  PT/OT: Evaluation not indicated at this time Diet: NPO, on D5-1/2NS at 100 cc/hr DVT Ppx: Lovenox 40 mg daily Admit Status: Inpatient Dispo: Anticipated discharge pending clinical improvement  Jeanmarie Hubert, MD 09/05/2019, 5:57 AM

## 2019-09-05 NOTE — Progress Notes (Signed)
Patient refused noon blood glucose per NT.

## 2019-09-05 NOTE — Progress Notes (Signed)
LTM maintenance completed; no skin breakdown under Fp2 F8, and A2.

## 2019-09-05 NOTE — Care Management Important Message (Signed)
Important Message  Patient Details  Name: Amanda Roman MRN: [id number] Date of Birth: April 22, 1941   Medicare Important Message Given:  Yes     Shelda Altes 09/05/2019, 11:39 AM

## 2019-09-05 NOTE — Plan of Care (Signed)
Continue to monitor

## 2019-09-05 NOTE — Progress Notes (Signed)
Physical Therapy Treatment Patient Details Name: Amanda Roman MRN: [id number] DOB: 1940/12/17 Today's Date: 09/05/2019    History of Present Illness 79yo female presenting from [place] with seizure like activity. 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 EEG with epileptogenicity in R temporal region. Admitted with status epilepticus and acute encephalopathy. PMH osteopenia, obeisty, HLD, HTN, hernia repair    PT Comments    I tried working with pt again today - before she had next dose of ativan.  She was not alert. She did not follow commands.  I did PROM with no active participation.  Pt resistive with UE PROM and WNL LE PROM.  Pts HR was improved today - in 90s during PROM.  At this point - I plan to sign off on skilled PT services.  Please reorder when pt more alert and able to participate.      Follow Up Recommendations  SNF;Supervision/Assistance - 24 hour     Equipment Recommendations       Recommendations for Other Services       Precautions / Restrictions Precautions Precautions: Fall Precaution Comments: lethargy Restrictions Weight Bearing Restrictions: No    Mobility  Bed Mobility                  Transfers                    Ambulation/Gait                 Stairs             Wheelchair Mobility    Modified Rankin (Stroke Patients Only)       Balance                                            Cognition Arousal/Alertness: Lethargic Behavior During Therapy: Flat affect Overall Cognitive Status: No family/caregiver present to determine baseline cognitive functioning                                 General Comments: didnt open eyes - i made sure to come when ativan worn off (getting ready to have another dose soon) - pt did wake up earlier this morning but not making sense per nursing      Exercises      General  Comments        Pertinent Vitals/Pain Pain Assessment: Faces Pain Score: 2  Pain Location: right hip with PROM Pain Descriptors / Indicators: Guarding;Discomfort Pain Intervention(s): Repositioned;Monitored during session    Home Living                      Prior Function            PT Goals (current goals can now be found in the care plan section) Progress towards PT goals: Not progressing toward goals - comment(Pt unabel to participate with PT at this time.  WIll hold, nursing to do PROM and please reorder if pt alert enough to participate in therapy)    Frequency           PT Plan Discharge plan needs to be updated(Will HOLD PT at this time - please reorder if pt more alert and able  to participate in mobilty)    Co-evaluation              AM-PAC PT "6 Clicks" Mobility   Outcome Measure  Help needed turning from your back to your side while in a flat bed without using bedrails?: Total Help needed moving from lying on your back to sitting on the side of a flat bed without using bedrails?: Total Help needed moving to and from a bed to a chair (including a wheelchair)?: Total Help needed standing up from a chair using your arms (e.g., wheelchair or bedside chair)?: Total Help needed to walk in hospital room?: Total Help needed climbing 3-5 steps with a railing? : Total 6 Click Score: 6    End of Session   Activity Tolerance: Patient limited by lethargy Patient left: in bed;with bed alarm set;with call bell/phone within reach Nurse Communication: Need for lift equipment PT Visit Diagnosis: Other abnormalities of gait and mobility (R26.89);Difficulty in walking, not elsewhere classified (R26.2);Muscle weakness (generalized) (M62.81)     Time: [id number] PT Time Calculation (min) (ACUTE ONLY): 15 min  Charges:  $Therapeutic Exercise: 8-22 mins                     09/05/2019   Rande Lawman, PT    [name] 09/05/2019,  11:30 AM

## 2019-09-05 NOTE — Procedures (Addendum)
Patient Name:Torina AURELLA JANUSZEWSKI [id number] Epilepsy Attending:Stefanos Haynesworth [name] Referring Physician/Provider:Dr Kerney Elbe Duration: 09/04/2019 2359 to  09/05/2019 2359  Patient history:79 yo F with epilepsy who presented with status epilepticuswhich improved, now with another episode of unresponsiveness and left UE twitching. EEG to evaluate for seizure.  Level of alertness:awake/lethargic, asleep  AEDs during EEG study:LEV, PHT, Ativan  Technical aspects: This EEG study was done with scalp electrodes positioned according to the 10-20 International system of electrode placement. Electrical activity was acquired at a sampling rate of 500Hz  and reviewed with a high frequency filter of 70Hz  and a low frequency filter of 1Hz . EEG data were recorded continuously and digitally stored.  DESCRIPTION: During awake state, no clear posterior dominant was seen. Sleep was characterized by vertex waves, sleep spindles (12-14hz ), maximal frontocentral. EEG showed continuous generalized low amplitude mixed frequencies predominantly 5 to 8 Hz theta-alpha activity admixed with 15 to 18 Hz beta activityin frontocentral regions.Hyperventilation and photic stimulation were not performed.  ABNORMALITY - Continuous slow, generalized  IMPRESSION: This studyshowed evidence of moderate diffuse encephalopathy, non specific to etiology, but could be secondary to medications.No seizures were seen during this study.  EEG appears to be improving compared to previous day.  Trenese Haft [name]

## 2019-09-05 NOTE — Progress Notes (Signed)
Patient's social worker, Lovena Le from [place] [phone]) called for a update and would like to speak with palliative care. M. Ferolito, NP paged at this time.

## 2019-09-05 NOTE — Progress Notes (Addendum)
Patient is becoming responsive to painful stimuli and being moved in the bed.  While still not talking she is now opening her eyes.  Informed on call physician.  Will continue to monitor.  Lupita Dawn, RN

## 2019-09-05 NOTE — Progress Notes (Signed)
Received referral regarding medication needs/cost for onfi and/or permapanel (for preventative therapy) and intranasal midazolam (for rescue therapy)- pt is under [facility] program- call made to [facility] with [facility] to see if they would cover these meds under the [facility] program- the [facility] MD and pharmacist is checking on this and [facility] MD is going to reach out to Neurology to further discuss. They will let us know regarding the plan regarding these meds.

## 2019-09-06 DIAGNOSIS — Z66 Do not resuscitate: Secondary | ICD-10-CM

## 2019-09-06 LAB — BASIC METABOLIC PANEL
Anion gap: 8 (ref 5–15)
BUN: <5 mg/dL — ABNORMAL LOW (ref 8–23)
CO2: 29 mmol/L (ref 22–32)
Calcium: 8.5 mg/dL — ABNORMAL LOW (ref 8.9–10.3)
Chloride: 103 mmol/L (ref 98–111)
Creatinine, Ser: 0.48 mg/dL (ref 0.44–1.00)
GFR calc Af Amer: >60 mL/min (ref >60)
GFR calc non Af Amer: >60 mL/min (ref >60)
Glucose, Bld: 121 mg/dL — ABNORMAL HIGH (ref 70–99)
Potassium: 3.3 mmol/L — ABNORMAL LOW (ref 3.5–5.1)
Sodium: 140 mmol/L (ref 135–145)

## 2019-09-06 LAB — GLUCOSE, CAPILLARY
Glucose-Capillary: 105 mg/dL — ABNORMAL HIGH (ref 70–99)
Glucose-Capillary: 102 mg/dL — ABNORMAL HIGH (ref 70–99)
Glucose-Capillary: 79 mg/dL (ref 70–99)
Glucose-Capillary: 73 mg/dL (ref 70–99)

## 2019-09-06 LAB — MAGNESIUM: Magnesium: 1.6 mg/dL — ABNORMAL LOW (ref 1.7–2.4)

## 2019-09-06 MED ORDER — MAGNESIUM SULFATE 4 GM/100ML IV SOLN
4.0000 g | Freq: Once | INTRAVENOUS | Status: AC
  Administered 2019-09-06: 4 g via INTRAVENOUS
  Filled 2019-09-06: qty 100

## 2019-09-06 MED ORDER — LORAZEPAM 2 MG/ML IJ SOLN
0.5000 mg | Freq: Three times a day (TID) | INTRAMUSCULAR | Status: DC
  Administered 2019-09-06 – 2019-09-07 (×5): 0.5 mg via INTRAVENOUS
  Filled 2019-09-06 (×5): qty 1

## 2019-09-06 MED ORDER — POTASSIUM CHLORIDE 10 MEQ/100ML IV SOLN
10.0000 meq | INTRAVENOUS | Status: AC
  Administered 2019-09-06 (×6): 10 meq via INTRAVENOUS
  Filled 2019-09-06 (×6): qty 100

## 2019-09-06 NOTE — Progress Notes (Signed)
Neurology Progress Note   S:// Seen and examined.  No overnight seizures. LTM EEG with moderate diffuse encephalopathy which is nonspecific but could be secondary to medications.  No seizures seen on EEG.  EEG similar to that of the prior day.  O:// Current vital signs: BP 131/69 (BP Location: Right Arm)   Pulse 77   Temp 98.1 F (36.7 C) (Oral)   Resp 20   Ht 5' (1.524 m)   Wt 59.3 kg   SpO2 100%   BMI 25.53 kg/m  Vital signs in last 24 hours: Temp:  [98.1 F (36.7 C)-98.8 F (37.1 C)] 98.1 F (36.7 C) (02/27 0829) Pulse Rate:  [77-96] 77 (02/27 0829) Resp:  [11-26] 20 (02/27 0829) BP: (124-161)/(64-148) 131/69 (02/27 0829) SpO2:  [95 %-100 %] 100 % (02/27 0829) Neurological exam Opens eyes to noxious stimulation. Does not follow commands Pupils are equal round reactive to light, eyes are disconjugate with right exotropia, no gaze deviation or preference, face grossly symmetric but at times difficult to discern due to her edentulousness. On motor examination, withdraws all 4 to noxious stimulation.   Medications  Current Facility-Administered Medications:  .  acetaminophen (TYLENOL) tablet 650 mg, 650 mg, Oral, Q4H PRN **OR** acetaminophen (TYLENOL) suppository 650 mg, 650 mg, Rectal, Q4H PRN, Jose Persia, MD .  dextrose 5 %-0.45 % sodium chloride infusion, , Intravenous, Continuous, Jeanmarie Hubert, MD, Last Rate: 100 mL/hr at 09/06/19 0105, New Bag at 09/06/19 0105 .  enoxaparin (LOVENOX) injection 40 mg, 40 mg, Subcutaneous, Q24H, Jose Persia, MD, 40 mg at 09/06/19 [id number] .  labetalol (NORMODYNE) injection 5 mg, 5 mg, Intravenous, Once, Maudie Mercury, MD .  levETIRAcetam (KEPPRA) IVPB 1500 mg/ 100 mL premix, 1,500 mg, Intravenous, Q12H, Lora Havens, MD, Last Rate: 400 mL/hr at 09/06/19 0907, 1,500 mg at 09/06/19 0907 .  LORazepam (ATIVAN) injection 1 mg, 1 mg, Intravenous, Q6H PRN, Lora Havens, MD .  LORazepam (ATIVAN) injection 1 mg, 1 mg,  Intravenous, TID, Lora Havens, MD, 1 mg at 09/06/19 0823 .  phenytoin (DILANTIN) injection 100 mg, 100 mg, Intravenous, Q8H, Seawell, Jaimie A, DO, 100 mg at 09/06/19 0604 .  potassium chloride 10 mEq in 100 mL IVPB, 10 mEq, Intravenous, Q1 Hr x 6, Jeanmarie Hubert, MD, Last Rate: 100 mL/hr at 09/06/19 0943, 10 mEq at 09/06/19 0943 .  spironolactone (ALDACTONE) tablet 12.5 mg, 12.5 mg, Oral, Daily, Jeanmarie Hubert, MD, 12.5 mg at 09/03/19 1507 Labs CBC    Component Value Date/Time   WBC 5.5 09/03/2019 1948   RBC 4.60 09/03/2019 1948   HGB 14.0 09/03/2019 1948   HGB 13.5 03/03/2016 1133   HCT 41.5 09/03/2019 1948   HCT 40.5 03/03/2016 1133   PLT 248 09/03/2019 1948   PLT 285 03/03/2016 1133   MCV 90.2 09/03/2019 1948   MCV 93 03/03/2016 1133   MCH 30.4 09/03/2019 1948   MCHC 33.7 09/03/2019 1948   RDW 13.1 09/03/2019 1948   RDW 13.0 03/03/2016 1133   LYMPHSABS 0.7 08/31/2019 0317   LYMPHSABS 1.8 03/03/2016 1133   MONOABS 0.6 08/31/2019 0317   EOSABS 0.0 08/31/2019 0317   EOSABS 0.2 03/03/2016 1133   BASOSABS 0.0 08/31/2019 0317   BASOSABS 0.0 03/03/2016 1133    CMP     Component Value Date/Time   NA 140 09/06/2019 0317   NA 143 12/13/2017 1042   K 3.3 (L) 09/06/2019 0317   CL 103 09/06/2019 0317   CO2 29 09/06/2019 0317  GLUCOSE 121 (H) 09/06/2019 0317   BUN <5 (L) 09/06/2019 0317   BUN 8 12/13/2017 1042   CREATININE 0.48 09/06/2019 0317   CREATININE 0.68 11/05/2014 1017   CALCIUM 8.5 (L) 09/06/2019 0317   PROT 7.1 09/03/2019 1948   ALBUMIN 2.5 (L) 09/05/2019 0339   AST 26 09/03/2019 1948   ALT 19 09/03/2019 1948   ALKPHOS 62 09/03/2019 1948   BILITOT 0.7 09/03/2019 1948   GFRNONAA >60 09/06/2019 0317   GFRNONAA 86 11/05/2014 1017   GFRAA >60 09/06/2019 0317   GFRAA >89 11/05/2014 1017   Imaging I have reviewed images in epic and the results pertinent to this consultation are: CT head 09/03/2019-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   No acute hemorrhage.  Stable mild rightward midline shift.  No acute hemorrhage.  Assessment: 79 year old: Breakthrough seizure -status epilepticus in the setting of weaning of Ativan.  She has had prior similar presentation with prolonged status epilepticus 1 year ago. Patient strict DNR hence all seizure control has to be done with caution so as to not intubate her. Her current status epilepticus and seizure had resolved but recurred again and she remains extremely encephalopathic. Changing the dose of phenytoin and reduction in the dose led to breakthrough seizures even though phenytoin was therapeutic.  With increased phenytoin Ativan patient is sedated. Ativan has been gradually tapered down.  Recommendations: Continue Keppra 1500 twice daily Continue Dilantin 100 3 times daily Reduce Ativan to 0.5 mg 3 times daily Continue palliative discussions Can use as needed Ativan for seizure lasting more than 5 minutes. Check PHT level in AM We will continue LTM for 1 more day, may need longer but will make decision on that day by day based on clinical course and EEG findings from the day Will defer Onfi or Perampanel discussions to [place] once she is stable, and improving on the current 3 AEDs (which thus far have controlled seizures, but mentation seems to be stagnant and not improving much)  -- Amie Portland, MD [facility] Pager: [phone] If 7pm to 7am, please call on call as listed on AMION.

## 2019-09-06 NOTE — Discharge Summary (Addendum)
Name: Amanda Roman MRN: [id number] DOB: 27-May-1941 79 y.o. PCP: [facility]  Date of Admission: 08/30/2019  5:31 PM Date of Discharge: 09/09/2019 Attending Physician: Velna Ochs, MD  Discharge Diagnosis: 1. Seizure   Discharge Medications: Allergies as of 09/09/2019      Reactions   Ace Inhibitors Swelling, Other (See Comments)   Angioedema   Penicillins Itching   Has patient had a PCN reaction causing immediate rash, facial/tongue/throat swelling, SOB or lightheadedness with hypotension: Yes Has patient had a PCN reaction causing severe rash involving mucus membranes or skin necrosis: No Has patient had a PCN reaction that required hospitalization No Has patient had a PCN reaction occurring within the last 10 years: No If all of the above answers are "NO", then may proceed with Cephalosporin use.   Acetaminophen-codeine Rash   Codeine Rash      Medication List    STOP taking these medications   aspirin EC 81 MG tablet   furosemide 20 MG tablet Commonly known as: LASIX   haloperidol 2 MG/ML solution Commonly known as: HALDOL   ibuprofen 100 MG/5ML suspension Commonly known as: ADVIL   LORazepam 0.5 MG tablet Commonly known as: ATIVAN   LORazepam 2 MG/ML concentrated solution Commonly known as: ATIVAN   oxyCODONE 5 MG immediate release tablet Commonly known as: Oxy IR/ROXICODONE   phenytoin 100 MG ER capsule Commonly known as: Dilantin     TAKE these medications   amLODipine 5 MG tablet Commonly known as: NORVASC Take 5 mg by mouth daily. What changed: Another medication with the same name was removed. Continue taking this medication, and follow the directions you see here.   cloBAZam 10 MG tablet Commonly known as: ONFI Take 0.5 tablets (5 mg total) by mouth 2 (two) times daily.   DOK Plus 8.6-50 MG tablet Generic drug: senna-docusate Take 2 tablets by mouth at bedtime.   levETIRAcetam 750 MG tablet Commonly  known as: KEPPRA Take 2 tablets (1,500 mg total) by mouth 2 (two) times daily. What changed: how much to take   OXYGEN Inhale into the lungs as needed (for low SATS).   phenytoin 125 MG/5ML suspension Commonly known as: DILANTIN Take 4 mLs (100 mg total) by mouth 3 (three) times daily.   potassium chloride SA 20 MEQ tablet Commonly known as: KLOR-CON Take 0.5 tablets (10 mEq total) by mouth every morning. What changed: how much to take   sertraline 100 MG tablet Commonly known as: ZOLOFT Take 100 mg by mouth daily.   spironolactone 25 MG tablet Commonly known as: ALDACTONE Take 1 tablet (25 mg total) by mouth daily.       Disposition and follow-up:   [name] was discharged from [facility] in Stable condition.  At the hospital follow up visit please address:  1.  Please make sure patient takes her prescribed anti-epileptic medications as follows:  A. Continue Keppra 1500 mg twice daily, Dilantin 100 mg 3 times daily, Onfi 5 mg twice daily  2.  Labs / imaging needed at time of follow-up: repeat CBC, BMP as discussed with Dr. Jimmye Norman.  3.  Pending labs/ test needing follow-up: none  Follow-up Appointments: [facility] will make follow up appointment per [facility] Course by problem list:  # Seizure disorder:  Patient is an 79 year old female with past medical history significant for vascular dementia, essential hypertension, major depressive disorder, seizure disorder who presented from [facility] on  09/03/2019 after having an approximately 15-minute seizure.  On arrival, patient was given Keppra, fosphenytoin, and Ativan.  Initial EEG showed evidence epileptogenic activity in right frontotemporal region.  CT head demonstrated new CSF fluid collection in the left frontoparietal convexity with a slight rightward midline shift.  Continuous EEG demonstrated brief interictal rhythmic discharges in the right frontotemporal region.  With  increase in antiepileptics, control of seizures was obtained, but patient remained sedated.  However, with down titration of antiepileptics, patient had repeat seizure activity on evening of 09/03/2019.  Antiepileptics were again increased with goal for slow titration, improvement in mental status with control of seizures.  Palliative care was consulted for goals of care discussion with family.Patients medications were adjusted and patient returned to baseline.   # Hypokalemia: Patient with chronic hypokalemia.  With hypokalemia and hypertension, this finding may be secondary to hyperaldosteronism.  Patient is not surgical candidate and therefore further diagnostic work-up for this etiology is not warranted as patient would benefit from spironolactone therapy regardless of results.  Patient was discharged on spironolactone 25  mg daily and amlodipine was changed to 5 mg. She will continue to receive oral potassium supplementation.   Discharge Vitals:   BP (!) 164/92 (BP Location: Right Leg)   Pulse 71   Temp 98.8 F (37.1 C) (Axillary)   Resp 19   Ht 5' (1.524 m)   Wt 58.4 kg   SpO2 90%   BMI 25.14 kg/m   Pertinent Labs, Studies, and Procedures:  CBC Latest Ref Rng & Units 09/03/2019 09/02/2019 08/31/2019  WBC 4.0 - 10.5 K/uL 5.5 5.9 9.8  Hemoglobin 12.0 - 15.0 g/dL 14.0 10.8(L) 11.6(L)  Hematocrit 36.0 - 46.0 % 41.5 33.4(L) 35.6(L)  Platelets 150 - 400 K/uL 248 220 260   BMP Latest Ref Rng & Units 09/09/2019 09/08/2019 09/07/2019  Glucose 70 - 99 mg/dL 128(H) 111(H) 104(H)  BUN 8 - 23 mg/dL <5(L) <5(L) <5(L)  Creatinine 0.44 - 1.00 mg/dL 0.52 0.43(L) 0.43(L)  BUN/Creat Ratio 12 - 28 - - -  Sodium 135 - 145 mmol/L 140 141 142  Potassium 3.5 - 5.1 mmol/L 3.2(L) 2.8(L) 3.5  Chloride 98 - 111 mmol/L 105 104 103  CO2 22 - 32 mmol/L 24 26 29   Calcium 8.9 - 10.3 mg/dL 8.9 8.8(L) 8.8(L)    CT Head WO Contrast (08/15/2018): IMPRESSION: Stable since 2019 and largely normal for age non contrast CT  appearance of the brain.  MR Brain WO Contrast (08/16/2018): IMPRESSION: 1. Technically limited exam due to motion artifact. 2. No acute intracranial abnormality. 3. Mild chronic microvascular ischemic disease for age.  Discharge Instructions: Discharge Instructions    Diet - low sodium heart healthy   Complete by: As directed    Diet - low sodium heart healthy   Complete by: As directed    Discharge instructions   Complete by: As directed    You were hospitalized for seizure. Thank you for allowing [place] to be part of your care.   We arranged for you to follow up at: [facility]   Please note these changes made to your medications: See discharge summary for medication changes   Please make sure to take your anti-epileptic medications  Please call our clinic if you have any questions or concerns, we may be able to help and keep you from a long and expensive emergency room wait. Our clinic and after hours phone number is [phone], the best time to call is Monday through Friday  9 am to 4 pm but there is always someone available 24/7 if you have an emergency. If you need medication refills please notify your pharmacy one week in advance and they will send [place] a request.   Discharge instructions   Complete by: As directed    You were hospitalized for seizures. Thank you for allowing [place] to be part of your care.   We arranged for you to follow up at: with [facility] for medication management.   Please note these changes made to your medications: see discharge summary for medicaiton changes   Please make sure to take you anti-epileptic medications medications  Please call our clinic if you have any questions or concerns, we may be able to help and keep you from a long and expensive emergency room wait. Our clinic and after hours phone number is [phone], the best time to call is Monday through Friday 9 am to 4 pm but there is always someone available 24/7 if you have an emergency.  If you need medication refills please notify your pharmacy one week in advance and they will send [place] a request.   Increase activity slowly   Complete by: As directed    Increase activity slowly   Complete by: As directed       Signed: Marianna Payment, MD 09/09/2019, 12:30 PM   Pager: [phone]

## 2019-09-06 NOTE — Progress Notes (Signed)
LTM maint complete - no skin breakdown under:  F3, F4, A1, A2,  Fp2 skin grade 1

## 2019-09-06 NOTE — Progress Notes (Signed)
  Subjective:  Amanda Roman was seen laying in her bed when team entered room. She was not responsive to voice.  Objective:   Vital Signs (last 24 hours): Vitals:   09/05/19 2000 09/05/19 2301 09/06/19 0000 09/06/19 0035  BP: (!) 154/102  128/64   Pulse:  89  86  Resp: 16 (!) 24 (!) 21 11  Temp:  98.8 F (37.1 C)  98.5 F (36.9 C)  TempSrc:  Oral  Axillary  SpO2:   99% 100%  Weight:      Height:       Physical Exam: General Resting comfortably in bed, does not open eyes or move extremities  Cardiac Regular rate and rhythm, no murmurs, rubs, or gallops  Pulmonary Clear to auscultation bilaterally without wheezes, rhonchi, or rales    BMP Latest Ref Rng & Units 09/06/2019 09/05/2019 09/04/2019  Glucose 70 - 99 mg/dL 121(H) 104(H) 94  BUN 8 - 23 mg/dL <5(L) <5(L) <5(L)  Creatinine 0.44 - 1.00 mg/dL 0.48 0.63 0.48  BUN/Creat Ratio 12 - 28 - - -  Sodium 135 - 145 mmol/L 140 143 143  Potassium 3.5 - 5.1 mmol/L 3.3(L) 3.3(L) 3.2(L)  Chloride 98 - 111 mmol/L 103 99 101  CO2 22 - 32 mmol/L 29 32 31  Calcium 8.9 - 10.3 mg/dL 8.5(L) 8.9 8.9   Mag: 1.6  Assessment/Plan:   Principal Problem:   Status epilepticus (HCC) Active Problems:   Hypokalemia   Vascular dementia, uncomplicated (HCC)   Pressure injury of skin   Goals of care, counseling/discussion   Palliative care by specialist  Patient is an 79 year old female with past medical history significant for vascular dementia, essential hypertension, major depressive disorder, seizure disorder who presented from [facility] on 09/03/2019 after having approximately 15-minute seizure  # Seizure disorder: On arrival, patient was given Keppra, fosphenytoin, and Ativan.  With down titration of antiepileptics, patient had repeat seizure activity on evening of 09/03/2019.  Antibiotics were again increased, goals for slow titration, improvement in mental status with control of seizures.  Continuous EEG shows mild improvement in  encephalopathy, no evidence of continued seizures. *Keppra 1500 mg every 12 hours *Phenytoin 100 mg every 8 hours *Ativan 1 mg 3 times daily *Goal is to have patient on once daily dosing of antiepileptics *Patient to be trialed on Onfi (clobazem) and/or perampanel (for preventative therapy) and intranasal midazolam (for rescue therapy)  # Hypokalemia: Potassium at 3.3 today and magnesium of 1.6, will replete  # Hypophosphatemia: Phosphorus of 3.8 on 2/26  # Hypertension: BP with adequate control, will continue spironolactone when patient is able to tolerate p.o. meds  PT/OT: Consulted  Diet: NPO, on D5-1/2NS at 100 cc/hr DVT Ppx: Lovenox 40 mg daily Admit Status: Inpatient Dispo: Anticipated discharge pending clinical improvement  Jeanmarie Hubert, MD 09/06/2019, 5:45 AM

## 2019-09-06 NOTE — Progress Notes (Addendum)
Palliative Medicine Inpatient Follow Up Note   HPI: Per intake H&P --> [name] is a 79 y/o female with a PMHx of status epilepticus (2020), dementia, HTN who presents to the ED with c/o seizure. Amanda Roman is altered during our interaction and so history was obtained through chart review.   Amanda Roman presents to the ED from [facility] after having seizure-like activity for 10-15 minutes earlier this afternoon. EMS gave 5 mg IM midazolam with resolution of seizures.   Patient has a past medical history of seizure in February 2020, which were difficult to control wiith 4 AEDs on board. She was discharged on Keppra, Phenytoin, and Ativan. ED provider spoke with SNF staff, who reported they have been tapering her Ativan and discontinued Phenytoin.    Today's Discussion (09/06/2019): Chart reviewed. Evaluated Ara at bedside she was able to open her eyes today though did not speak to me.  Patient sister, [name] is hopeful that Amanda Roman seizures can be controlled on the medications the neurologist provides. She does state that she would not want to prolong her suffering if Amanda Roman continues to have poorly controlled seizures but she does not want to stop her medications either. I told her that the goal of the medical team is to get a regiment that could hopefully optimize her seizures. We talked about Neurology trying to get medications approved that can be given through non oral routes.   [name] has a strong trust in her [facility] physicians and SW team and it seems would best respond to their recommendations moving forward.   Discussed with patient the importance of continued conversation with family and their  medical providers regarding overall plan of care and treatment options, ensuring decisions are within the context of the patients values and GOCs.  Questions and concerns addressed   Vital Signs Vitals:   09/06/19 0035 09/06/19 0829  BP:  131/69  Pulse: 86 77  Resp: 11 20    Temp: 98.5 F (36.9 C) 98.1 F (36.7 C)  SpO2: 100% 100%    Intake/Output Summary (Last 24 hours) at 09/06/2019 0856 Last data filed at 09/05/2019 2300 Gross per 24 hour  Intake 1143.42 ml  Output --  Net 1143.42 ml   Last Weight  Most recent update: 09/05/2019  4:10 AM   Weight  59.3 kg (130 lb 11.7 oz)           Physical Exam Vitals and nursing note reviewed.   Constitutional:      Comments: Somnolent  HENT:     Head: Normocephalic.     Nose: Nose normal.     Mouth/Throat:     Mouth: Mucous membranes are dry.  Cardiovascular:     Rate and Rhythm: Regular rhythm. Tachycardia present.     Pulses: Normal pulses.  Pulmonary:     Effort: Pulmonary effort is normal.  Abdominal:     General: Abdomen is flat.  Neurological:     Mental Status: She is opening eyes   SUMMARY OF RECOMMENDATIONS   DNAR, DNI, no feeding tubes, no hemodialysis  Neurology is trying to get alternative therapies approved for seizures  Patients sister, [name] would like to see if Serenia can improve, she is open to hospice if it is deemed appropriate by the medical team  Reached out to [facility] social worker though it appears that we are playing phone tag. Will continue to try to get into contact.  Time Spent: 25 Greater than 50% of the time was  spent in counseling and coordination of care ______________________________________________________________________________________ [place] Team Team Cell Phone: [phone] Please utilize secure chat with additional questions, if there is no response within 30 minutes please call the above phone number  Palliative Medicine Team providers are available by phone from 7am to 7pm daily and can be reached through the team cell phone.  Should this patient require assistance outside of these hours, please call the patient's attending physician.

## 2019-09-06 NOTE — Procedures (Addendum)
Patient Name:Amanda Roman [id number] Epilepsy Attending:Shelly Spenser [name] Referring Physician/Provider:Dr Kerney Elbe Duration:09/05/2019 [id number] to 09/06/2019 2359  Patient history:79 yo F with epilepsy who presented with status epilepticuswhich improved, now with another episode of unresponsiveness and left UE twitching. EEG to evaluate for seizure.  Level of alertness:awake, asleep  AEDs during EEG study:LEV, PHT, Ativan  Technical aspects: This EEG study was done with scalp electrodes positioned according to the 10-20 International system of electrode placement. Electrical activity was acquired at a sampling rate of 500Hz  and reviewed with a high frequency filter of 70Hz  and a low frequency filter of 1Hz . EEG data were recorded continuously and digitally stored.  DESCRIPTION: During awake state, no clear posterior dominant was seen. Sleep was characterized by vertex waves, sleep spindles (12-14hz ), maximal frontocentral. EEG showed continuous generalized low amplitude mixed frequencies predominantly 5 to 8 Hz theta-alpha activity admixed with 15 to 18 Hz beta activityin frontocentral regions.Hyperventilation and photic stimulation were not performed.  ABNORMALITY - Continuous slow, generalized   IMPRESSION: This studyshowed evidence of moderate diffuse encephalopathy, non specific to etiology, but could be secondary to medications.No seizures were seen during this study.  EEG appears similar to previous day.  Shaunte Weissinger [name]

## 2019-09-07 LAB — BASIC METABOLIC PANEL
Anion gap: 10 (ref 5–15)
BUN: <5 mg/dL — ABNORMAL LOW (ref 8–23)
CO2: 29 mmol/L (ref 22–32)
Calcium: 8.8 mg/dL — ABNORMAL LOW (ref 8.9–10.3)
Chloride: 103 mmol/L (ref 98–111)
Creatinine, Ser: 0.43 mg/dL — ABNORMAL LOW (ref 0.44–1.00)
GFR calc Af Amer: >60 mL/min (ref >60)
GFR calc non Af Amer: >60 mL/min (ref >60)
Glucose, Bld: 104 mg/dL — ABNORMAL HIGH (ref 70–99)
Potassium: 3.5 mmol/L (ref 3.5–5.1)
Sodium: 142 mmol/L (ref 135–145)

## 2019-09-07 LAB — GLUCOSE, CAPILLARY
Glucose-Capillary: 114 mg/dL — ABNORMAL HIGH (ref 70–99)
Glucose-Capillary: 126 mg/dL — ABNORMAL HIGH (ref 70–99)
Glucose-Capillary: 60 mg/dL — ABNORMAL LOW (ref 70–99)
Glucose-Capillary: 64 mg/dL — ABNORMAL LOW (ref 70–99)
Glucose-Capillary: 67 mg/dL — ABNORMAL LOW (ref 70–99)
Glucose-Capillary: 75 mg/dL (ref 70–99)
Glucose-Capillary: 84 mg/dL (ref 70–99)

## 2019-09-07 LAB — PHENYTOIN LEVEL, TOTAL: Phenytoin Lvl: 14.6 ug/mL (ref 10.0–20.0)

## 2019-09-07 LAB — MAGNESIUM: Magnesium: 1.9 mg/dL (ref 1.7–2.4)

## 2019-09-07 MED ORDER — DEXTROSE 50 % IV SOLN
50.0000 mL | Freq: Once | INTRAVENOUS | Status: AC
  Administered 2019-09-07: 50 mL via INTRAVENOUS

## 2019-09-07 MED ORDER — DEXTROSE 50 % IV SOLN
25.0000 mL | Freq: Once | INTRAVENOUS | Status: AC
  Administered 2019-09-07: 25 mL via INTRAVENOUS

## 2019-09-07 MED ORDER — DEXTROSE 50 % IV SOLN
INTRAVENOUS | Status: AC
  Administered 2019-09-07: 25 mL
  Filled 2019-09-07: qty 50

## 2019-09-07 MED ORDER — DEXTROSE 50 % IV SOLN
INTRAVENOUS | Status: AC
  Filled 2019-09-07: qty 50

## 2019-09-07 MED ORDER — LABETALOL HCL 5 MG/ML IV SOLN
5.0000 mg | INTRAVENOUS | Status: DC | PRN
  Administered 2019-09-09: 08:00:00 5 mg via INTRAVENOUS
  Filled 2019-09-07: qty 4

## 2019-09-07 NOTE — Procedures (Addendum)
Patient Name:Amanda Roman [id number] Epilepsy Attending:Solimar Maiden [name] Referring Physician/Provider:Dr Kerney Elbe Duration:09/06/2019 [id number] to 2/28/[id number]  Patient history:79 yo F with epilepsy who presented with status epilepticuswhich improved, now with another episode of unresponsiveness and left UE twitching. EEG to evaluate for seizure.  Level of alertness:awake, asleep  AEDs during EEG study:LEV, PHT, Ativan  Technical aspects: This EEG study was done with scalp electrodes positioned according to the 10-20 International system of electrode placement. Electrical activity was acquired at a sampling rate of 500Hz  and reviewed with a high frequency filter of 70Hz  and a low frequency filter of 1Hz . EEG data were recorded continuously and digitally stored.  DESCRIPTION:During awake state, no clear posterior dominant was seen.Sleep was characterized by vertex waves, sleep spindles (12-14hz ), maximal frontocentral.EEG showed continuous generalized low amplitude mixed frequencies predominantly 5 to 8 Hz theta-alpha activity admixed with 15 to 18 Hz beta activityin frontocentral regions.Hyperventilation and photic stimulation were not performed.  ABNORMALITY - Continuous slow, generalized   IMPRESSION: This studyshowed evidence ofmoderatediffuse encephalopathy, non specific to etiology, but could be secondary to medications.No seizures were seen during this study.  Brooke Steinhilber [name]

## 2019-09-07 NOTE — Progress Notes (Signed)
Nevin Bloodgood, RN with [facility] called for an update. Update provided.

## 2019-09-07 NOTE — Progress Notes (Signed)
   Subjective: HD#8   Overnight: No acute events reported  Today, [name] was examined at bedside.  She was able to follow some commands.  Objective:  Vital signs in last 24 hours: Vitals:   09/06/19 1941 09/06/19 2000 09/06/19 2348 09/07/19 0422  BP: (!) 170/92 (!) 163/93 (!) 177/98 (!) 158/92  Pulse: 89 86 89 86  Resp: 15 (!) 22 (!) 27 (!) 30  Temp: 98 F (36.7 C) 98 F (36.7 C) 98.9 F (37.2 C) 98.5 F (36.9 C)  TempSrc: Axillary  Axillary Axillary  SpO2: 96% 96% 100% 95%  Weight:    62.3 kg  Height:       Const: Alert and oriented x0 Resp: CTA BL, no wheezes, crackles, rhonchi CV: RRR, no murmurs, gallop, rub Neuro: She was able to squeeze my fingers bilaterally and also wiggle her toes.   Assessment/Plan:  Principal Problem:   Status epilepticus ([place]) Active Problems:   Hypokalemia   Vascular dementia, uncomplicated (HCC)   Pressure injury of skin   Goals of care, counseling/discussion   Palliative care by specialist   DNR (do not resuscitate)  Patient is an 79 year old female with past medical history significant for vascular dementia, essential hypertension, major depressive disorder, seizure disorder who presented from [facility] on [facility]/2019 after having approximately 15-minute seizure  # Seizure disorder: On arrival, patient was given Keppra, fosphenytoin, and Ativan.  With down titration of antiepileptics, patient had repeat seizure activity on evening of 09/03/2019.  AEDs were again increased, goals for slow titration, improvement in mental status with control of seizures.  Continuous EEG did not show evidence of seizure activity. *Keppra 1500 mg every 12 hours *Phenytoin 100 mg every 8 hours *Ativan 0.5 mg 3 times daily *Goal is to have patient on once daily dosing of antiepileptics *Patient to be trialed on Onfi (clobazem) and/or perampanel (for preventative therapy) and intranasal midazolam (for rescue therapy)  # Hypokalemia:  Resolved  # Hypertension: BP with adequate control, will continue spironolactone when patient is able to tolerate p.o. meds   Jean Rosenthal, MD 09/07/2019, 6:29 AM Pager: [phone] Internal Medicine Teaching Service

## 2019-09-07 NOTE — Progress Notes (Signed)
Hypoglycemic Event  CBG: 60  Treatment: 32mL D50 IV given  Symptoms: lethargic  Follow-up CBG: Time:1223 CBG Result:75  Possible Reasons for Event: NPO  Comments/MD notified:    Lavenia Atlas

## 2019-09-07 NOTE — Progress Notes (Signed)
LTM maint complete - skin breakdown under: Fp2, F8, P3. Skin grade 2-3 Tech checked F3, F4, Fp1, F7, A2, C3, C4 also

## 2019-09-07 NOTE — Progress Notes (Signed)
Amanda Roman of [facility] has called to check on Amanda Roman. Update given to the caller, and she promises to call back tomorrow.

## 2019-09-07 NOTE — Progress Notes (Signed)
Patient's blood sugar has improved , it is now 126. Will continue to monitor.

## 2019-09-07 NOTE — Progress Notes (Signed)
   Palliative Medicine Inpatient Follow Up Note   HPI: Per intake H&P --> Ms. Honour Wachs is a 79 y/o female with a PMHx of status epilepticus (2020), dementia, HTN who presents to the ED with c/o seizure. Ms. Dobosz is altered during our interaction and so history was obtained through chart review.   Ms. Whitcomb presents to the ED from [facility] after having seizure-like activity for 10-15 minutes earlier this afternoon. EMS gave 5 mg IM midazolam with resolution of seizures.   Patient has a past medical history of seizure in February 2020, which were difficult to control wiith 4 AEDs on board. She was discharged on Keppra, Phenytoin, and Ativan. ED provider spoke with SNF staff, who reported they have been tapering her Ativan and discontinued Phenytoin.    Today's Discussion (09/07/2019): Chart reviewed. Evaluated Kessa at bedside she was able to open her eyes today and mumble a few words, this is an improvement from the day prior.   Patient sister, [name] remains hopeful for improvement and for [place] to transfer back to [facility] on an optimal reigment to prevent ongoing seizures.   Discussed with patient the importance of continued conversation with family and their  medical providers regarding overall plan of care and treatment options, ensuring decisions are within the context of the patients values and GOCs.  Questions and concerns addressed   We will continue to follow peripherally. Please reach out to the team directly if there are any additional concerns.   Vital Signs Vitals:   09/07/19 1402 09/07/19 1700  BP: (!) 169/103 (!) 165/102  Pulse: 86 87  Resp: 20 (!) 22  Temp: (!) 97.5 F (36.4 C) 98.3 F (36.8 C)  SpO2: 94% 94%   No intake or output data in the 24 hours ending 09/07/19 1732 Last Weight  Most recent update: 09/07/2019  4:24 AM   Weight  62.3 kg (137 lb 5.6 oz)           Physical Exam Vitals and nursing note reviewed.   Constitutional:    Comments: Somnolent  HENT:     Head: Normocephalic.     Nose: Nose normal.     Mouth/Throat:     Mouth: Mucous membranes are dry.  Cardiovascular:     Rate and Rhythm: Regular rhythm. Tachycardia present.     Pulses: Normal pulses.  Pulmonary:     Effort: Pulmonary effort is normal.  Abdominal:     General: Abdomen is flat.  Neurological:     Mental Status: She is opening eyes   SUMMARY OF RECOMMENDATIONS   DNAR, DNI, no feeding tubes, no hemodialysis  Neurology is trying to get alternative therapies approved for seizures  Patients sister, [name] would like to see if [place] can improve, she is open to hospice if it is deemed appropriate by the medical team  [facility] MD is being updated by primary medical team  Time Spent: 15 Greater than 50% of the time was spent in counseling and coordination of care ______________________________________________________________________________________ [place] Team Team Cell Phone: [phone] Please utilize secure chat with additional questions, if there is no response within 30 minutes please call the above phone number  Palliative Medicine Team providers are available by phone from 7am to 7pm daily and can be reached through the team cell phone.  Should this patient require assistance outside of these hours, please call the patient's attending physician.

## 2019-09-07 NOTE — Progress Notes (Addendum)
Neurology Progress Note   S:// Seen and examined. Patient more resting comfortably. Awakens to noxious stimuli. Able to mumble a few sentences. PHT level  14.8 today. continue LTM  O:// Current vital signs: BP (!) 161/83 (BP Location: Right Arm)   Pulse 86   Temp 98.1 F (36.7 C) (Axillary)   Resp 13   Ht 5' (1.524 m)   Wt 62.3 kg   SpO2 94%   BMI 26.82 kg/m  Vital signs in last 24 hours: Temp:  [97.8 F (36.6 C)-98.9 F (37.2 C)] 98.1 F (36.7 C) (02/28 [id number]) Pulse Rate:  [78-98] 86 (02/28 0812) Resp:  [13-30] 13 (02/28 0812) BP: (123-177)/(74-98) 161/83 (02/28 0812) SpO2:  [94 %-100 %] 94 % (02/28 0812) Weight:  [62.3 kg] 62.3 kg (02/28 0422)   Neurological exam Opens eyes to noxious stimulation.Does not follow commands. Right nasolabial fold flattening in the presence of being edentulous. Worse when not alert.  Pupils are equal round reactive to light, eyes are disconjugate with right exotropia, that improves when patient is fully alert. no gaze deviation or preference,. On motor examination, withdraws all 4 to noxious stimulation. Moves left side more than right side. Moves LUE to noxious stimuli. Not holding BLE anti gravity.   Medications  Current Facility-Administered Medications:  .  acetaminophen (TYLENOL) tablet 650 mg, 650 mg, Oral, Q4H PRN **OR** acetaminophen (TYLENOL) suppository 650 mg, 650 mg, Rectal, Q4H PRN, Jose Persia, MD .  dextrose 5 %-0.45 % sodium chloride infusion, , Intravenous, Continuous, Jeanmarie Hubert, MD, Last Rate: 100 mL/hr at 09/07/19 0118, New Bag at 09/07/19 0118 .  enoxaparin (LOVENOX) injection 40 mg, 40 mg, Subcutaneous, Q24H, Jose Persia, MD, 40 mg at 09/07/19 0816 .  labetalol (NORMODYNE) injection 5 mg, 5 mg, Intravenous, Once, Maudie Mercury, MD .  levETIRAcetam (KEPPRA) IVPB 1500 mg/ 100 mL premix, 1,500 mg, Intravenous, Q12H, Lora Havens, MD, Last Rate: 400 mL/hr at 09/07/19 0815, 1,500 mg at 09/07/19 0815 .   LORazepam (ATIVAN) injection 0.5 mg, 0.5 mg, Intravenous, TID, Amie Portland, MD, 0.5 mg at 09/07/19 0816 .  LORazepam (ATIVAN) injection 1 mg, 1 mg, Intravenous, Q6H PRN, Lora Havens, MD .  phenytoin (DILANTIN) injection 100 mg, 100 mg, Intravenous, Q8H, Seawell, Jaimie A, DO, 100 mg at 09/07/19 0636 .  spironolactone (ALDACTONE) tablet 12.5 mg, 12.5 mg, Oral, Daily, Jeanmarie Hubert, MD, 12.5 mg at 09/03/19 1507 Labs  PHT 14.6 09/07/19:  CBC    Component Value Date/Time   WBC 5.5 09/03/2019 1948   RBC 4.60 09/03/2019 1948   HGB 14.0 09/03/2019 1948   HGB 13.5 03/03/2016 1133   HCT 41.5 09/03/2019 1948   HCT 40.5 03/03/2016 1133   PLT 248 09/03/2019 1948   PLT 285 03/03/2016 1133   MCV 90.2 09/03/2019 1948   MCV 93 03/03/2016 1133   MCH 30.4 09/03/2019 1948   MCHC 33.7 09/03/2019 1948   RDW 13.1 09/03/2019 1948   RDW 13.0 03/03/2016 1133   LYMPHSABS 0.7 08/31/2019 0317   LYMPHSABS 1.8 03/03/2016 1133   MONOABS 0.6 08/31/2019 0317   EOSABS 0.0 08/31/2019 0317   EOSABS 0.2 03/03/2016 1133   BASOSABS 0.0 08/31/2019 0317   BASOSABS 0.0 03/03/2016 1133    CMP     Component Value Date/Time   NA 142 09/07/2019 0328   NA 143 12/13/2017 1042   K 3.5 09/07/2019 0328   CL 103 09/07/2019 0328   CO2 29 09/07/2019 0328   GLUCOSE 104 (H) 09/07/2019 0328  BUN <5 (L) 09/07/2019 0328   BUN 8 12/13/2017 1042   CREATININE 0.43 (L) 09/07/2019 0328   CREATININE 0.68 11/05/2014 1017   CALCIUM 8.8 (L) 09/07/2019 0328   PROT 7.1 09/03/2019 1948   ALBUMIN 2.5 (L) 09/05/2019 0339   AST 26 09/03/2019 1948   ALT 19 09/03/2019 1948   ALKPHOS 62 09/03/2019 1948   BILITOT 0.7 09/03/2019 1948   GFRNONAA >60 09/07/2019 0328   GFRNONAA 86 11/05/2014 1017   GFRAA >60 09/07/2019 0328   GFRAA >89 11/05/2014 1017   Imaging No new imaging  LTM 09/05/25-09/07/19 0815  IMPRESSION: This studyshowed evidence ofmoderatediffuse encephalopathy, non specific to etiology, but could be  secondary to medications.No seizures were seen during this study.  Laurey Morale, MSN, NP-C [facility] [phone]  Assessment: 79 year old: Breakthrough seizure -status epilepticus in the setting of weaning of Ativan.  She has had prior similar presentation with prolonged status epilepticus 1 year ago. Patient strict DNR hence all seizure control has to be done with caution so as to not intubate her. Her current status epilepticus and seizure had resolved but recurred again and she remains extremely encephalopathic. Changing the dose of phenytoin and reduction in the dose led to breakthrough seizures even though phenytoin was therapeutic.  With increased phenytoin Ativan patient is sedated. Ativan has been gradually tapered down.  Recommendations: -Continue Keppra 1500 twice daily -Continue Dilantin 100 three times daily -Ativan to 0.5 mg three times daily -Continue palliative discussions -Can use as needed Ativan for seizure lasting more than 5 minutes if absolutely necessary.  If she is agitated, I would use low-dose Seroquel-12.5 mg at bedtime.  Would try to avoid extra Ativan as much as possible. We will continue LTM for 1 more day.  Discussed with Dr. Hortense Ramal.  Dr. Hortense Ramal to follow tomorrow.  -- Amie Portland, MD [facility] Pager: [phone] If 7pm to 7am, please call on call as listed on AMION.

## 2019-09-07 NOTE — Progress Notes (Signed)
Pt is alert and oriented and very combative.

## 2019-09-07 NOTE — Progress Notes (Signed)
Patient's Blood sugar is 67 Provider on call notified, new order received. We'll continue to monitor.

## 2019-09-07 NOTE — Progress Notes (Signed)
Hypoglycemic Event  CBG: 64  Treatment: 50 ml D50 IV given per protocol Symptoms: drowsy  Follow-up CBG:1716 CBG Result:114  Possible Reasons for Event:NPO Comments/MD notified.    Lavenia Atlas

## 2019-09-08 DIAGNOSIS — G40901 Epilepsy, unspecified, not intractable, with status epilepticus: Principal | ICD-10-CM

## 2019-09-08 DIAGNOSIS — R4182 Altered mental status, unspecified: Secondary | ICD-10-CM

## 2019-09-08 DIAGNOSIS — Z7189 Other specified counseling: Secondary | ICD-10-CM

## 2019-09-08 LAB — BASIC METABOLIC PANEL
Anion gap: 11 (ref 5–15)
BUN: <5 mg/dL — ABNORMAL LOW (ref 8–23)
CO2: 26 mmol/L (ref 22–32)
Calcium: 8.8 mg/dL — ABNORMAL LOW (ref 8.9–10.3)
Chloride: 104 mmol/L (ref 98–111)
Creatinine, Ser: 0.43 mg/dL — ABNORMAL LOW (ref 0.44–1.00)
GFR calc Af Amer: >60 mL/min (ref >60)
GFR calc non Af Amer: >60 mL/min (ref >60)
Glucose, Bld: 111 mg/dL — ABNORMAL HIGH (ref 70–99)
Potassium: 2.8 mmol/L — ABNORMAL LOW (ref 3.5–5.1)
Sodium: 141 mmol/L (ref 135–145)

## 2019-09-08 LAB — GLUCOSE, CAPILLARY
Glucose-Capillary: 104 mg/dL — ABNORMAL HIGH (ref 70–99)
Glucose-Capillary: 106 mg/dL — ABNORMAL HIGH (ref 70–99)
Glucose-Capillary: 111 mg/dL — ABNORMAL HIGH (ref 70–99)
Glucose-Capillary: 82 mg/dL (ref 70–99)

## 2019-09-08 LAB — ALBUMIN: Albumin: 2.4 g/dL — ABNORMAL LOW (ref 3.5–5.0)

## 2019-09-08 LAB — PHENYTOIN LEVEL, TOTAL: Phenytoin Lvl: 17.5 ug/mL (ref 10.0–20.0)

## 2019-09-08 LAB — SARS CORONAVIRUS 2 (TAT 6-24 HRS): SARS Coronavirus 2: NEGATIVE

## 2019-09-08 MED ORDER — CLOBAZAM 10 MG PO TABS
5.0000 mg | ORAL_TABLET | Freq: Two times a day (BID) | ORAL | Status: DC
  Administered 2019-09-08 – 2019-09-09 (×2): 5 mg via ORAL
  Filled 2019-09-08 (×3): qty 1

## 2019-09-08 MED ORDER — POTASSIUM CHLORIDE CRYS ER 20 MEQ PO TBCR
40.0000 meq | EXTENDED_RELEASE_TABLET | Freq: Once | ORAL | Status: DC
  Filled 2019-09-08: qty 2

## 2019-09-08 MED ORDER — POTASSIUM CHLORIDE 10 MEQ/100ML IV SOLN
10.0000 meq | INTRAVENOUS | Status: AC
  Administered 2019-09-08 – 2019-09-09 (×4): 10 meq via INTRAVENOUS
  Filled 2019-09-08 (×4): qty 100

## 2019-09-08 MED ORDER — SPIRONOLACTONE 12.5 MG HALF TABLET
12.5000 mg | ORAL_TABLET | Freq: Every day | ORAL | Status: DC
  Administered 2019-09-09: 09:00:00 12.5 mg via ORAL
  Filled 2019-09-08: qty 1

## 2019-09-08 MED ORDER — POTASSIUM CHLORIDE CRYS ER 20 MEQ PO TBCR
30.0000 meq | EXTENDED_RELEASE_TABLET | Freq: Two times a day (BID) | ORAL | Status: DC
  Administered 2019-09-08: 17:00:00 30 meq via ORAL
  Filled 2019-09-08: qty 1

## 2019-09-08 MED ORDER — POTASSIUM CHLORIDE CRYS ER 20 MEQ PO TBCR: 40.0000 meq | EXTENDED_RELEASE_TABLET | Freq: Two times a day (BID) | ORAL | Status: DC

## 2019-09-08 MED ORDER — OLANZAPINE 10 MG IM SOLR
2.5000 mg | Freq: Once | INTRAMUSCULAR | Status: AC
  Administered 2019-09-08: 2.5 mg via INTRAMUSCULAR
  Filled 2019-09-08: qty 10

## 2019-09-08 MED ORDER — HALOPERIDOL LACTATE 5 MG/ML IJ SOLN: 1.0000 mg | Freq: Once | INTRAMUSCULAR | Status: DC

## 2019-09-08 MED ORDER — SPIRONOLACTONE 12.5 MG HALF TABLET
12.5000 mg | ORAL_TABLET | Freq: Every day | ORAL | Status: DC
  Filled 2019-09-08: qty 1

## 2019-09-08 NOTE — Progress Notes (Signed)
Subjective: HD#9 Events Overnight: No events overnight  Patient was seen this morning on rounds.  Patient was resting comfortably in bed.  She was alert and oriented to person and was able to answer questions very slowly.  She denies any symptoms at this time.  Objective:  Vital signs in last 24 hours: Vitals:   09/08/19 0011 09/08/19 0400 09/08/19 0700 09/08/19 0819  BP: (!) 150/98 (!) 167/107  (!) 172/106  Pulse: 94 91 89 89  Resp: 18 19 (!) 24 (!) 24  Temp: 98.1 F (36.7 C) 98.2 F (36.8 C) 98 F (36.7 C) 98 F (36.7 C)  TempSrc: Axillary Oral Oral Oral  SpO2: 96% 98% 100% 100%  Weight:  61 kg    Height:        Physical Exam: Physical Exam  Constitutional: No distress.  HENT:  Head: Normocephalic and atraumatic.  Eyes: EOM are normal.  Cardiovascular: Normal rate and intact distal pulses.  Pulmonary/Chest: Effort normal and breath sounds normal. No respiratory distress. She exhibits no tenderness.  Abdominal: Soft. She exhibits no distension. There is no abdominal tenderness.  Musculoskeletal:        General: No tenderness or edema. Normal range of motion.     Cervical back: Normal range of motion.  Neurological: She is alert.  Skin: Skin is warm and dry. She is not diaphoretic.    Filed Weights   09/05/19 0400 09/07/19 0422 09/08/19 0400  Weight: 59.3 kg 62.3 kg 61 kg    No intake or output data in the 24 hours ending 09/08/19 1507  Pertinent labs/Imaging: CBC Latest Ref Rng & Units 09/03/2019 09/02/2019 08/31/2019  WBC 4.0 - 10.5 K/uL 5.5 5.9 9.8  Hemoglobin 12.0 - 15.0 g/dL 14.0 10.8(L) 11.6(L)  Hematocrit 36.0 - 46.0 % 41.5 33.4(L) 35.6(L)  Platelets 150 - 400 K/uL 248 220 260    CMP Latest Ref Rng & Units 09/08/2019 09/07/2019 09/06/2019  Glucose 70 - 99 mg/dL 111(H) 104(H) 121(H)  BUN 8 - 23 mg/dL <5(L) <5(L) <5(L)  Creatinine 0.44 - 1.00 mg/dL 0.43(L) 0.43(L) 0.48  Sodium 135 - 145 mmol/L 141 142 140  Potassium 3.5 - 5.1 mmol/L 2.8(L) 3.5 3.3(L)    Chloride 98 - 111 mmol/L 104 103 103  CO2 22 - 32 mmol/L 26 29 29   Calcium 8.9 - 10.3 mg/dL 8.8(L) 8.8(L) 8.5(L)  Total Protein 6.5 - 8.1 g/dL - - -  Total Bilirubin 0.3 - 1.2 mg/dL - - -  Alkaline Phos 38 - 126 U/L - - -  AST 15 - 41 U/L - - -  ALT 0 - 44 U/L - - -    No results found.  Assessment/Plan:  Principal Problem:   Status epilepticus ([place]) Active Problems:   Hypokalemia   Vascular dementia, uncomplicated (HCC)   Pressure injury of skin   Goals of care, counseling/discussion   Palliative care by specialist   DNR (do not resuscitate)    Patient Summary: Amanda Roman is a 79 y.o. with pertinent PMH of vascular dementia, HTN, MDD, seizure disorder, admit for seizure on hospital day 9  #Seizure disorder:  Patients AEDs have been weaned down and the patient is alert and oriented to self.  Patient appears to be at baseline.  She will likely be discharged today back to her home facility on phenytoin, Keppra, Onfi.  Patient will follow up with [facility] for medication management.  #Hypokalemia:  Patient continues to have hypokalemia of 2.8.  I will replete potassium  today.  She will go home with potassium and close follow-up with her PCP.  #HTN: - Continue antihypertensive medications.   - Spironolactone was added back  Diet: Dysphagia 1 diet IVF: None VTE: Enoxaparin Code: DNR PT/OT recs: Pending TOC recs: Discharged back to her facility at [facility].   Dispo: Anticipated discharge pending her Covid test.   Marianna Payment, D.O. [facility], PGY-1 Date 09/08/2019 Time 3:07 PM

## 2019-09-08 NOTE — Progress Notes (Signed)
LTM discontinued, no skin break down noted.

## 2019-09-08 NOTE — Procedures (Signed)
Patient Name:Amanda Roman [id number] Epilepsy Attending:Meygan Kyser [name] Referring Physician/Provider:Dr Kerney Elbe Duration:09/07/2019 [id number] to 3/1/[id number]  Patient history:79 yo F with epilepsy who presented with status epilepticuswhich improved, now with another episode of unresponsiveness and left UE twitching. EEG to evaluate for seizure.  Level of alertness:awake, asleep  AEDs during EEG study:LEV, PHT, Ativan  Technical aspects: This EEG study was done with scalp electrodes positioned according to the 10-20 International system of electrode placement. Electrical activity was acquired at a sampling rate of 500Hz  and reviewed with a high frequency filter of 70Hz  and a low frequency filter of 1Hz . EEG data were recorded continuously and digitally stored.  DESCRIPTION:During awake state, no clear posterior dominant was seen.Sleep was characterized by vertex waves, sleep spindles (12-14hz ), maximal frontocentral.EEG showed continuous generalized low amplitude mixed frequencies predominantly 5 to 8 Hz theta-alpha activity admixed with 15 to 18 Hz beta activityin frontocentral regions.Hyperventilation and photic stimulation were not performed.  ABNORMALITY - Continuous slow, generalized   IMPRESSION: This studyshowed evidence ofmoderatediffuse encephalopathy, non specific to etiology, but could be secondary to medications.No seizures were seen during this study.  Gardner Servantes [name]

## 2019-09-08 NOTE — Progress Notes (Signed)
Internal Medicine Attending Note:  I have seen and evaluated this patient and I have discussed the plan of care with the house staff. Please see their note for complete details. I concur with their findings.   Velna Ochs, MD 09/08/2019, 4:01 PM

## 2019-09-08 NOTE — Progress Notes (Signed)
Late entry    LTM maint complete - no skin breakdown under: FP1, F4, P3

## 2019-09-08 NOTE — Progress Notes (Addendum)
Subjective: No acute events overnight.  No clinical seizure-like activity.  Awake, communicating this morning.  Denies any concerns at this point.  ROS: Unable to obtain due to baseline advanced dementia  Examination  Vital signs in last 24 hours: Temp:  [98 F (36.7 C)-98.3 F (36.8 C)] 98 F (36.7 C) (03/01 0819) Pulse Rate:  [87-94] 89 (03/01 0819) Resp:  [15-24] 24 (03/01 0819) BP: (150-179)/(98-107) 172/106 (03/01 0819) SpO2:  [94 %-100 %] 100 % (03/01 0819) Weight:  [61 kg] 61 kg (03/01 0400)  General: lying in bed, not in apparent distress CVS: pulse-normal rate and rhythm RS: breathing comfortably, CTA B Extremities: normal, warm  Neuro: MS: Alert, o oriented to self, thinks she is at her apartment and does not know time (this is baseline), able to follow simple one-step commands like showing me 2 fingers and sticking out her tongue  CN: pupils equal and reactive, able to track me in the room, blinks to threat bilaterally, no apparent facial asymmetry, tongue midline  Motor: Able to move all 4 extremities voluntarily in bed without any apparent weakness  Basic Metabolic Panel: Recent Labs  Lab 09/03/19 1948 09/03/19 1948 09/04/19 0247 09/04/19 0247 09/05/19 0339 09/05/19 0339 09/06/19 0317 09/07/19 0328 09/08/19 0634  NA 139   < > 143  --  143  --  140 142 141  K 3.6   < > 3.2*  --  3.3*  --  3.3* 3.5 2.8*  CL 98   < > 101  --  99  --  103 103 104  CO2 32   < > 31  --  32  --  29 29 26   GLUCOSE 190*   < > 94  --  104*  --  121* 104* 111*  BUN <5*   < > <5*  --  <5*  --  <5* <5* <5*  CREATININE 0.54   < > 0.48  --  0.63  --  0.48 0.43* 0.43*  CALCIUM 9.1   < > 8.9   < > 8.9   < > 8.5* 8.8* 8.8*  MG 2.7*  --  2.1  --  1.8  --  1.6* 1.9  --   PHOS  --   --  1.4*  --  3.8  --   --   --   --    < > = values in this interval not displayed.    CBC: Recent Labs  Lab 09/02/19 0239 09/03/19 1948  WBC 5.9 5.5  HGB 10.8* 14.0  HCT 33.4* 41.5  MCV 92.3 90.2   PLT 220 248     Coagulation Studies: No results for input(s): LABPROT, INR in the last 72 hours.  Imaging No new brain imaging.  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 At that time, discussions regarding her suppression were had with family, but she has made it clear that she never wants to have a breathing tubewhich has been reconfirmed during this admission by Dr. Leonel Ramsay.  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  He is neurologically fragile and slight adjustment in doses can lead to status epilepticus  Recommendations -Continue Keppra 1500 mg twice daily, Dilantin 100 mg 3 times daily, Onfi 5 mg twice daily -As patient is awake and very close to baseline without  any breakthrough seizures, we  not make any adjustments in AEDs at this point -If patient appears sedated or has any side effects, can consider slowly weaning off Dilantin in future -If patient has any breakthrough seizures lasting less than 2 minutes, can increase Onfi to 10 mg nightly while continuing 5 mg every morning. -If patient has any breakthrough seizure lasting more than 2 minutes, administer intranasal midazolam 5 mg in each nostril.  This can be repeated after 5 minutes while monitoring for respiratory depression -If patient slowly improves, then we can increase antiepileptic drugs and minimize hospital readmission -However, patient continues to be sedated or continues to have seizures, can consider discussion with patient's sister about comfort care as patient is DNR and any further benzodiazepine could lead to respiratory decompensation -I have discussed my recommendations with Dr. Jimmye Norman and primary  team.  Thank you for allowing [place] to participate in the care of this patient. Neurology will followperipherally while patient is inpatient. Please page neuro hospitalist for any further questions after 5 PM.  I have spent a total of25 minuteswith the patient reviewing hospitalnotes, test results, labs and examining the patient as well as establishing an assessment and plan that was discussed personally with the patient'sphysicians, nurse.>50% of time was spent in direct patient care.   Comer Devins [name]

## 2019-09-08 NOTE — Progress Notes (Signed)
Occupational Therapy Treatment + Discharge Patient Details Name: Amanda Roman MRN: [id number] DOB: June 05, 1941 Today's Date: 09/08/2019    History of present illness 79yo female presenting from [place] with seizure like activity. 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 EEG with epileptogenicity in R temporal region. Admitted with status epilepticus and acute encephalopathy. PMH osteopenia, obeisty, HLD, HTN, hernia repair   OT comments  Pt appears at her functional level with near [place] for ADL and mobility. Pt rolling in bed for pericare as pt unaware of incontinence. Pt following a few commands today. No further skilled OT services required. RN staff to use lift for OOB transfers. OT d/Roman today.     Follow Up Recommendations  SNF    Equipment Recommendations  None recommended by OT    Recommendations for Other Services      Precautions / Restrictions Precautions Precautions: Fall Restrictions Weight Bearing Restrictions: No       Mobility Bed Mobility Overal bed mobility: Needs Assistance Bed Mobility: Rolling;Sidelying to Sit;Sit to Supine Rolling: Total assist Sidelying to sit: Total assist   Sit to supine: Total assist   General bed mobility comments: Pt extending BLEs and pt with no trunk control at EOB  Transfers                 General transfer comment: deferred. RN staff to use lift    Balance Overall balance assessment: Needs assistance   Sitting balance-Leahy Scale: Zero                                     ADL either performed or assessed with clinical judgement   ADL Overall ADL's : Needs assistance/impaired                                     Functional mobility during ADLs: Total assistance General ADL Comments: TotalA due to weakness and cognitive impairments     Vision       Perception     Praxis      Cognition  Arousal/Alertness: Awake/alert Behavior During Therapy: Flat affect Overall Cognitive Status: No family/caregiver present to determine baseline cognitive functioning                                 General Comments: Pt alert and following commands today. Pt able to tell OTR what channel to turn TV to        Exercises Exercises: Other exercises Other Exercises Other Exercises: AAROM to BUEs   Shoulder Instructions       General Comments Pt appears at her functional baseline with dementia and SNF level of care required for ADL/mobility- near totalA    Pertinent Vitals/ Pain       Pain Assessment: Faces Faces Pain Scale: No hurt  Home Living                                          Prior Functioning/Environment              Frequency           Progress  Toward Goals  OT Goals(current goals can now be found in the care plan section)  Progress towards OT goals: Not progressing toward goals - comment(Pt d/Roman from OT caseload as of today)  Acute Rehab OT Goals Patient Stated Goal: none stated OT Goal Formulation: Patient unable to participate in goal setting  Plan All goals met and education completed, patient discharged from OT services;Discharge plan needs to be updated(Pt at functional baseline; no progress to be made)    Co-evaluation                 AM-PAC OT "6 Clicks" Daily Activity     Outcome Measure   Help from another person eating meals?: Total Help from another person taking care of personal grooming?: Total Help from another person toileting, which includes using toliet, bedpan, or urinal?: Total Help from another person bathing (including washing, rinsing, drying)?: Total Help from another person to put on and taking off regular upper body clothing?: Total Help from another person to put on and taking off regular lower body clothing?: Total 6 Click Score: 6    End of Session    OT Visit Diagnosis: Other  abnormalities of gait and mobility (R26.89);Muscle weakness (generalized) (M62.81);Other symptoms and signs involving cognitive function   Activity Tolerance Patient limited by fatigue   Patient Left in bed;with call bell/phone within reach;with bed alarm set   Nurse Communication Mobility status        Time: 8502-7741 OT Time Calculation (min): 21 min  Charges: OT General Charges $OT Visit: 1 Visit OT Treatments $Self Care/Home Management : 8-22 mins  Amanda Roman, OTR/L Acute Rehabilitation Services Pager: [phone] Office: [phone]   Amanda Roman 09/08/2019, 4:26 PM

## 2019-09-08 NOTE — Progress Notes (Signed)
  Speech Language Pathology Treatment: Dysphagia  Patient Details Name: Amanda Roman MRN: [id number] DOB: 06/04/1941 Today's Date: 09/08/2019 Time: [id number] SLP Time Calculation (min) (ACUTE ONLY): 11 min  Assessment / Plan / Recommendation Clinical Impression  Pt was seen for dysphagia treatment. Her level of cooperative and participation during today's session was improved compared to that which was noted during the evaluation. She was adequately alert for the session but her level of alertness waned as the session progressed and trials of more advanced solids were deferred for this reason. She tolerated puree solids and thin liquids without s/sx of aspiration. Labial stripping was reduced and an oral/pharyngeal delay is suspected due to time of bolus presentation compared to onset of hyolaryngeal elevation. Pt was made NPO following her change in status. A dysphagia 1 (puree) diet with thin liquids is recommended at this time. SLP will follow to assess diet tolerance and her ability to tolerate more advanced solids.    HPI HPI: Pt is an 79 y/o female with a PMHx of status epilepticus (2020), dementia, HTN who presents to the ED with c/o seizure. Chest x-ray of 2/20: Low lung volumes with mild left basilar atelectasis. CT of the head: No definite acute intracranial abnormality. New CSF density extra-axial collection along the left frontoparietal convexity favored to represent a subdural hygroma or chronic subdural hematoma. 2-3 mm rightward midline shift. Palliative care: DNR, DNI, no feeding tubes, no hemodialysis, family open to hospice if deemed appropriate by staff. EEG 2/28: moderate diffuse encephalopathy.      SLP Plan  Continue with current plan of care       Recommendations  Diet recommendations: Dysphagia 1 (puree);Thin liquid Liquids provided via: Cup;Straw Medication Administration: Crushed with puree Supervision: Full supervision/cueing for compensatory strategies;Trained  caregiver to feed patient Compensations: Slow rate;Small sips/bites;Minimize environmental distractions Postural Changes and/or Swallow Maneuvers: Seated upright 90 degrees;Upright 30-60 min after meal                Oral Care Recommendations: Oral care BID Follow up Recommendations: None SLP Visit Diagnosis: Dysphagia, unspecified (R13.10) Plan: Continue with current plan of care       Amanda Roman. Amanda Roman, Amanda Roman, Amanda Roman Office number [phone] Pager Amanda Roman 09/08/2019, 11:49 AM

## 2019-09-08 NOTE — TOC Transition Note (Signed)
Transition of Care [facility]) - CM/SW Discharge Note   Patient Details  Name: Amanda Roman MRN: [id number] Date of Birth: Jan 13, 1941  [facility]) CM/SW Contact:  Sable Feil, LCSW Phone Number: 09/08/2019, 5:05 PM   Clinical Narrative:   Patient medically stable for d/c per MD. Contact made with Freda Munro, admissions director at [facility] regarding patient's readiness for discharge. Freda Munro asked about COVID test and one is needed. MD contacted and COVID test requested and MD advised patient can d/c once test results.    Final next level of care: [place]     Patient Goals and CMS Choice        Discharge Placement - [facility]                       Discharge Plan and Services In-house Referral: Clinical Social Work                                   Social Determinants of Health (SDOH) Interventions  No SDOH interventions requested or needed at this time.    Readmission Risk Interventions No flowsheet data found.

## 2019-09-09 DIAGNOSIS — F0150 Vascular dementia without behavioral disturbance: Secondary | ICD-10-CM

## 2019-09-09 LAB — BASIC METABOLIC PANEL
Anion gap: 11 (ref 5–15)
BUN: <5 mg/dL — ABNORMAL LOW (ref 8–23)
CO2: 24 mmol/L (ref 22–32)
Calcium: 8.9 mg/dL (ref 8.9–10.3)
Chloride: 105 mmol/L (ref 98–111)
Creatinine, Ser: 0.52 mg/dL (ref 0.44–1.00)
GFR calc Af Amer: >60 mL/min (ref >60)
GFR calc non Af Amer: >60 mL/min (ref >60)
Glucose, Bld: 128 mg/dL — ABNORMAL HIGH (ref 70–99)
Potassium: 3.2 mmol/L — ABNORMAL LOW (ref 3.5–5.1)
Sodium: 140 mmol/L (ref 135–145)

## 2019-09-09 LAB — GLUCOSE, CAPILLARY
Glucose-Capillary: 96 mg/dL (ref 70–99)
Glucose-Capillary: 105 mg/dL — ABNORMAL HIGH (ref 70–99)
Glucose-Capillary: 76 mg/dL (ref 70–99)

## 2019-09-09 MED ORDER — AMLODIPINE BESYLATE 10 MG PO TABS
10.0000 mg | ORAL_TABLET | Freq: Every day | ORAL | Status: DC
  Administered 2019-09-09: 10 mg via ORAL
  Filled 2019-09-09: qty 1

## 2019-09-09 MED ORDER — CLOBAZAM 10 MG PO TABS: 5.0000 mg | ORAL_TABLET | Freq: Two times a day (BID) | ORAL | 0 refills | Status: AC

## 2019-09-09 MED ORDER — POTASSIUM CHLORIDE CRYS ER 20 MEQ PO TBCR: 10.0000 meq | EXTENDED_RELEASE_TABLET | Freq: Every morning | ORAL | 3 refills | Status: AC

## 2019-09-09 MED ORDER — SPIRONOLACTONE 25 MG PO TABS: 25.0000 mg | ORAL_TABLET | Freq: Every day | ORAL | 0 refills | Status: AC

## 2019-09-09 MED ORDER — PHENYTOIN 125 MG/5ML PO SUSP: 100.0000 mg | Freq: Three times a day (TID) | ORAL | 12 refills | Status: AC

## 2019-09-09 NOTE — TOC Transition Note (Signed)
Transition of Care [facility]) - CM/SW Discharge Note   Patient Details  Name: Amanda Roman MRN: [id number] Date of Birth: October 24, 1940  [facility] CM/SW Contact:  Vinie Sill, [place] Phone Number: 09/09/2019, 2:07 PM   Clinical Narrative:     Patient will DC to: [facility] Date: 09/09/2019 Family Notified: Lillian,Sister Transport [id number]  RN, patient, and facility notified of DC. Discharge Summary sent to facility. RN given number for [phone], Room 601-[place]. Ambulance transport requested for patient.   Clinical Social Worker signing off.  Thurmond Butts, MSW, [place] Clinical Social Worker    Final next level of care: [place]     Patient Goals and CMS Choice        Discharge Placement              Patient chooses bed at: [facility] Patient to be transferred to facility by: [place] Name of family member notified: Lillian,sister Patient and family notified of of transfer: 09/09/19  Discharge Plan and Services In-house Referral: Clinical Social Work                                   Social Determinants of Health (SDOH) Interventions     Readmission Risk Interventions No flowsheet data found.

## 2019-09-09 NOTE — Progress Notes (Addendum)
Subjective: HD#10 Events Overnight: no events overnight  Patient was seen this morning on rounds. She was comfortably lying in bed without signs of agitation. She kept on regurgitation "I don't want the medicine, they gave me a whole lot of medicine." She does comprehend and reports that she was unaware of why she was on her medicine. We explained to her the indications for her medicines. She experiences pain once in a while but not presently.   Objective:  Vital signs in last 24 hours: Vitals:   09/08/19 1644 09/09/19 0013 09/09/19 0335 09/09/19 0351  BP: (!) 165/80 (!) 144/30 (!) 178/98   Pulse: 100 (!) 101 96   Resp: 18 (!) 23 (!) 21   Temp: 98.4 F (36.9 C) 98.8 F (37.1 C) 99.3 F (37.4 C)   TempSrc: Oral Axillary Axillary   SpO2: 96% 99%    Weight:    58.4 kg  Height:        Physical Exam: Physical Exam  Constitutional: She is well-developed, well-nourished, and in no distress.  HENT:  Head: Normocephalic and atraumatic.  Eyes: EOM are normal.  Cardiovascular: Normal rate.  Pulmonary/Chest: Effort normal. No respiratory distress. She exhibits no tenderness.  Abdominal: Soft. She exhibits no distension. There is no abdominal tenderness.  Musculoskeletal:        General: No tenderness or edema. Normal range of motion.     Cervical back: Normal range of motion.  Neurological: She is alert.  Skin: Skin is warm and dry.    Filed Weights   09/07/19 0422 09/08/19 0400 09/09/19 0351  Weight: 62.3 kg 61 kg 58.4 kg     Intake/Output Summary (Last 24 hours) at 09/09/2019 0636 Last data filed at 09/09/2019 0159 Gross per 24 hour  Intake 500 ml  Output --  Net 500 ml    Pertinent labs/Imaging: CBC Latest Ref Rng & Units 09/03/2019 09/02/2019 08/31/2019  WBC 4.0 - 10.5 K/uL 5.5 5.9 9.8  Hemoglobin 12.0 - 15.0 g/dL 14.0 10.8(L) 11.6(L)  Hematocrit 36.0 - 46.0 % 41.5 33.4(L) 35.6(L)  Platelets 150 - 400 K/uL 248 220 260    CMP Latest Ref Rng & Units 09/08/2019 09/07/2019  09/06/2019  Glucose 70 - 99 mg/dL 111(H) 104(H) 121(H)  BUN 8 - 23 mg/dL <5(L) <5(L) <5(L)  Creatinine 0.44 - 1.00 mg/dL 0.43(L) 0.43(L) 0.48  Sodium 135 - 145 mmol/L 141 142 140  Potassium 3.5 - 5.1 mmol/L 2.8(L) 3.5 3.3(L)  Chloride 98 - 111 mmol/L 104 103 103  CO2 22 - 32 mmol/L 26 29 29   Calcium 8.9 - 10.3 mg/dL 8.8(L) 8.8(L) 8.5(L)  Total Protein 6.5 - 8.1 g/dL - - -  Total Bilirubin 0.3 - 1.2 mg/dL - - -  Alkaline Phos 38 - 126 U/L - - -  AST 15 - 41 U/L - - -  ALT 0 - 44 U/L - - -    No results found.  Assessment/Plan:  Principal Problem:   Status epilepticus ([place]) Active Problems:   Hypokalemia   Vascular dementia, uncomplicated (HCC)   Pressure injury of skin   Goals of care, counseling/discussion   Palliative care by specialist   DNR (do not resuscitate)    Patient Summary: LAURON OFFENBACHER is a 79 y.o. with pertinent PMH of vascular dementia, HTN, MDD, seizure disorder, who presented, admit for seizures on hospital day 10  #Seizure disorder Patient remained stable on her current dose of antiepileptic medications.  Patient is stable for discharge at this time.  #  Hypokalemia Patient continues to have hypokalemia.  I spoke with the patient's PCP, Dr. Jimmye Norman and she states to increase her spironolactone 25 mg and to continue oral potassium therapy as an outpatient.  She states that she will follow-up these medication recs in the outpatient setting.  #HTN: Continue current antihypertensives regimen.  Diet: Dysphagia 1 IVF: None VTE: Enoxaparin Code: Full PT/OT recs: SNF TOC recs: SNF   Dispo: Anticipated discharge today.    Marianna Payment, D.O. [facility], PGY-1 Date 09/09/2019 Time 6:36 AM

## 2019-09-09 NOTE — Progress Notes (Signed)
[facility] to transport pt. Report called to facility. IV removed clean and intact. Sister informed of patients d/c.  Clyde Canterbury, RN

## 2019-09-09 NOTE — Progress Notes (Signed)
Internal Medicine Attending Note:  I have seen and evaluated this patient and I have discussed the plan of care with the house staff. Please see their note for complete details. I concur with their findings.  Somewhat confused today but redirectable. I suspect a component of her baseline vascular dementia and possible hospital delirium. From a medical standpoint, she is stable for discharge as long as she is taking her medications. Appreciate nursing staff assistance with patient. She has been refusing meds intermittently. Has not missed her antiepileptics per RN and per med rec.   Velna Ochs, MD 09/09/2019, 1:08 PM

## 2019-09-09 NOTE — Progress Notes (Signed)
Subjective: No acute events overnight.  Per RN, patient has been intermittently agitated and was refusing medications but did take them eventually.  ROS: unable to obtain due to poor mental status  Examination  Vital signs in last 24 hours: Temp:  [98.4 F (36.9 C)-99.3 F (37.4 C)] 99 F (37.2 C) (03/02 0748) Pulse Rate:  [96-101] 96 (03/02 0335) Resp:  [18-24] 24 (03/02 0748) BP: (144-181)/(30-123) 181/123 (03/02 0748) SpO2:  [96 %-99 %] 98 % (03/02 0748) Weight:  [58.4 kg] 58.4 kg (03/02 0351)  General: lying in bed, not in apparent distress CVS: pulse-normal rate and rhythm RS: breathing comfortably, CTA B Extremities: normal, warm  Neuro: MS: Asleep, on asking if she is doing okay she said "leave me alone", denied any concerns  Motor:  Appears to be moving voluntarily in bed  Basic Metabolic Panel: Recent Labs  Lab 09/03/19 1948 09/03/19 1948 09/04/19 0247 09/04/19 0247 09/05/19 0339 09/05/19 0339 09/06/19 0317 09/06/19 0317 09/07/19 0328 09/08/19 0634 09/09/19 0726  NA 139   < > 143   < > 143  --  140  --  142 141 140  K 3.6   < > 3.2*   < > 3.3*  --  3.3*  --  3.5 2.8* 3.2*  CL 98   < > 101   < > 99  --  103  --  103 104 105  CO2 32   < > 31   < > 32  --  29  --  29 26 24   GLUCOSE 190*   < > 94   < > 104*  --  121*  --  104* 111* 128*  BUN <5*   < > <5*   < > <5*  --  <5*  --  <5* <5* <5*  CREATININE 0.54   < > 0.48   < > 0.63  --  0.48  --  0.43* 0.43* 0.52  CALCIUM 9.1   < > 8.9   < > 8.9   < > 8.5*   < > 8.8* 8.8* 8.9  MG 2.7*  --  2.1  --  1.8  --  1.6*  --  1.9  --   --   PHOS  --   --  1.4*  --  3.8  --   --   --   --   --   --    < > = values in this interval not displayed.    CBC: Recent Labs  Lab 09/03/19 1948  WBC 5.5  HGB 14.0  HCT 41.5  MCV 90.2  PLT 248     Coagulation Studies: No results for input(s): LABPROT, INR in the last 72 hours.  Imaging No new brain imaging.  ASSESSMENT AND PLAN:79 year old female with breakthrough  seizures in the setting of weaning Ativan.She had a similar presentation with prolonged partial status epilepticus a year ago. At that time, discussions regarding her suppression were had with family, but she has made it clear that she never wants to have a breathing tubewhich has been reconfirmed during this admission by Dr. Leonel Ramsay.  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  He is neurologically fragile and slight adjustment in doses can lead to status epilepticus  Recommendations -Continue Keppra 1500 mg twice daily, Dilantin 100 mg 3 times daily, Onfi 5 mg twice daily -As patient is awake and very close to  baseline without any breakthrough seizures, we  not make any adjustments in AEDs at this point -If patient appears sedated or has any side effects, can consider slowly weaning off Dilantin in future -If patient has any breakthrough seizures lasting less than 2 minutes, can increase Onfi to 10 mg nightly while continuing 5 mg every morning. -If patient has any breakthrough seizure lasting more than 2 minutes, administer intranasal midazolam 5 mg in each nostril.  This can be repeated after 5 minutes while monitoring for respiratory depression -If patient slowly improves, then we can increase antiepileptic drugs and minimize hospital readmission -However, patient continues to be sedated or continues to have seizures, can consider discussion with patient's sister about comfort care as patient is DNR and any further benzodiazepine could lead to respiratory decompensation -Can consider scheduling medication like low-dose Seroquel if patient gets agitated rather than using prn repeatedly. -Also patient refuses to take medication, all these medications can be crushed and administered with applesauce  Thank you for allowing [place] to participate  in the care of this patient. Neurology will sign off.  Please page neuro hospitalist for any further questions after 5 PM.  I have spent a total of25 minuteswith the patient reviewing hospitalnotes, test results, labs and examining the patient as well as establishing an assessment and plan that was discussed personally with the patient'steam>50% of time was spent in direct patient care.   Iram Astorino [name]

## 2019-09-09 NOTE — Progress Notes (Addendum)
  Speech Language Pathology Treatment: Dysphagia  Patient Details Name: Amanda Roman MRN: [id number] DOB: Dec 17, 1940 Today's Date: 09/09/2019 Time: [id number] SLP Time Calculation (min) (ACUTE ONLY): 18 min  Assessment / Plan / Recommendation Clinical Impression  Pt received in bed, patient sleeping upon ST entry, but she was able to be roused with verbal cues. ST completed oral care with toothette via in-line suction. Pt is edentulous. HOB raised so patient upright. Patient opening eyes briefly, but primarily with eyes closed throughout this session. Patient seen with thin liquids via cup and straw sips (administered by ST). Pt with mild anterior labial spillage with cup sips, she appeared sensate to spillage. Audible swallow noted, but no overt s/sx aspiration seen. When provided with straw, patient able to orally grasp and siphon liquid but needed moderate verbal/tactile cues to do so.   Patient provided with small bites of puree solids (applesauce, magic cup): pt occasionally verbalized "no, no" when presented with bolus, but upon further questioning, she verbalized "you didn't give me any" and accepted bolus when presented with max verbal/tactile cues. Pt with milld-moderately prolonged oral phase with pureed boluses, mildly reduced bolus manipulation (patient with limited mastication), but AP transit present and pt initiated the swallow. ?delayed swallow initiation, but difficult to assess at bedside. Grossly adequate oral clearance achieved following the swallow when given small bites. No overt s/sx aspiration.   Recommend continuation of dysphagia 1 solids, thin liquids. Full supervision. PO when alert/upright. Frequent oral care. Small bites/sips. Check oral cavity for pocketing. Meds crushed in puree.  ST to follow as per POC.   HPI HPI: Pt is an 79 y/o female with a PMHx of status epilepticus (2020), dementia, HTN who presents to the ED with c/o seizure. Chest x-ray of 2/20: Low lung  volumes with mild left basilar atelectasis. CT of the head: No definite acute intracranial abnormality. New CSF density extra-axial collection along the left frontoparietal convexity favored to represent a subdural hygroma or chronic subdural hematoma. 2-3 mm rightward midline shift. Palliative care: DNR, DNI, no feeding tubes, no hemodialysis, family open to hospice if deemed appropriate by staff. EEG 2/28: moderate diffuse encephalopathy.      SLP Plan  Continue with current plan of care       Recommendations  Diet recommendations: Dysphagia 1 (puree);Thin liquid Liquids provided via: Cup;Straw Medication Administration: Crushed with puree Supervision: Trained caregiver to feed patient;Full supervision/cueing for compensatory strategies Compensations: Slow rate;Small sips/bites;Minimize environmental distractions Postural Changes and/or Swallow Maneuvers: Seated upright 90 degrees;Upright 30-60 min after meal                Oral Care Recommendations: Oral care BID Follow up Recommendations: 24 hour supervision/assistance SLP Visit Diagnosis: Dysphagia, unspecified (R13.10) Plan: Continue with current plan of care       [place] 09/09/2019, 1:12 PM Marina Goodell, M.Ed., [place] [phone]: Acute Rehab office [phone] - pager

## 2019-09-09 NOTE — Progress Notes (Signed)
Dr Richarda Blade called and made aware that patient is refusing meds,V.S. and assessment . She is agitated and swinging at staff.Yells out at times. Patient is very confused. See orders to give Potassium I.V. and Zyprexa 2.5 mg I.M. for agitation.

## 2019-10-20 IMAGING — DX DG CHEST 1V PORT
1 series · 1 of 1 positions shown · non-contrast
Comparison: None.

CLINICAL DATA: Central line placement.

EXAM:
PORTABLE CHEST 1 VIEW

[chest]
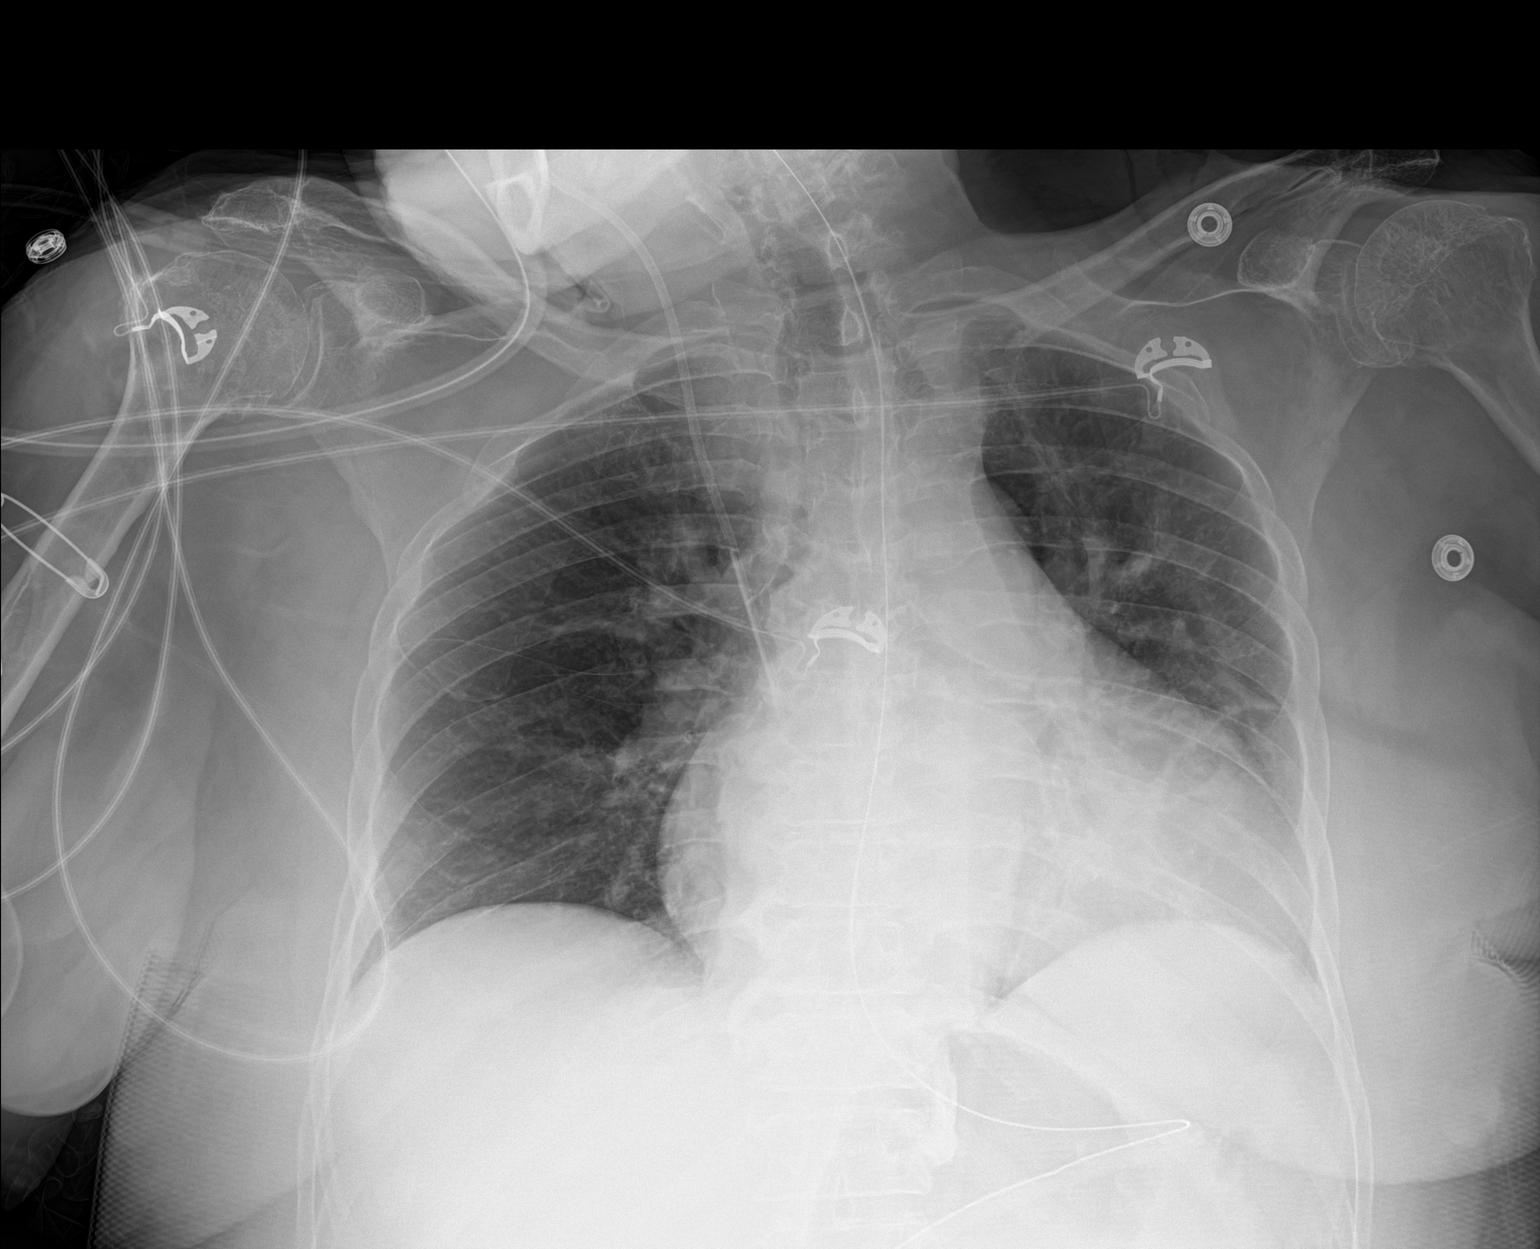

[1 of 1 positions shown; findings below may reference images not displayed]

FINDINGS: RIGHT IJ central line appears adequately positioned with tip at the
level of the mid/lower SVC. No pneumothorax seen. Lungs are clear.
Enteric tube passes below the diaphragm. Cardiomegaly.
IMPRESSION: 1. RIGHT IJ central line appears adequately positioned with tip at
the level of the mid/lower SVC. No pneumothorax seen.
2. Enteric tube appears adequately positioned below the diaphragm.
3. Cardiomegaly.

## 2019-11-02 IMAGING — CT CT ABD-PELV W/ CM
2 of 5 series · 15 of 46 positions shown, 17 images · IV contrast (ISOVUE)
Comparison: [DATE]

CLINICAL DATA: Patient [facility] [HOSPITAL] [REDACTED]- patient lives
by nursing staff at [REDACTED] for a scheduled
appointment. Patient reported to have hernia repair surgery [REDACTED] approximately- patient has JP drain to left abdomen with dark
red output. Staff at [HOSPITAL] [REDACTED] states the patient was
crying in pain in their waitingroom, but was not alert and oriented.
Due to not getting a report from [REDACTED] staff, who
were not present, EMS services were called. Patient obeys commands
in triage and responds to her name, but patient is not oriented to
time and situation in triage

EXAM:
CT ABDOMEN AND PELVIS WITH CONTRAST
TECHNIQUE: Multidetector CT imaging of the abdomen and pelvis was performed
using the standard protocol following bolus administration of
intravenous contrast.
CONTRAST:  100mL [id number] IOPAMIDOL ([id number]) INJECTION 61%

[Series 2: axial st · axial · 0.75mm/px · z∈[-392,-37]mm · 12 of 83 slices shown, 14 images]
[im 6/83  soft-tissue]
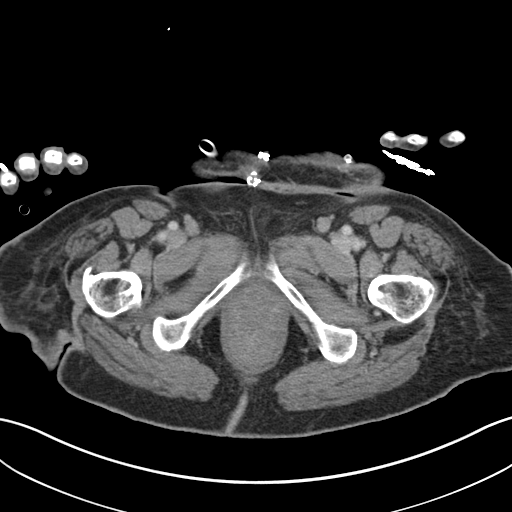
[im 6/83  bone]
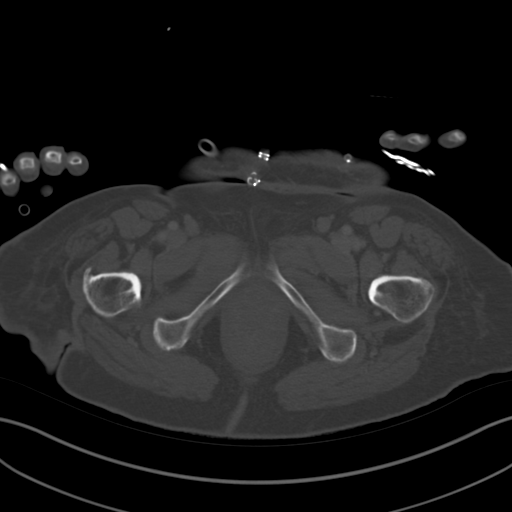
[im 11/83  soft-tissue]
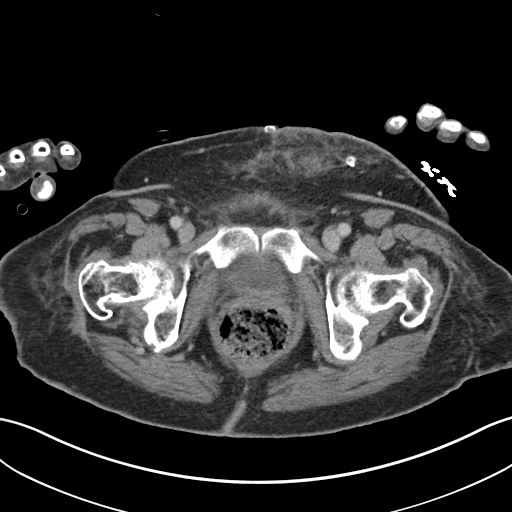
[im 21/83  soft-tissue]
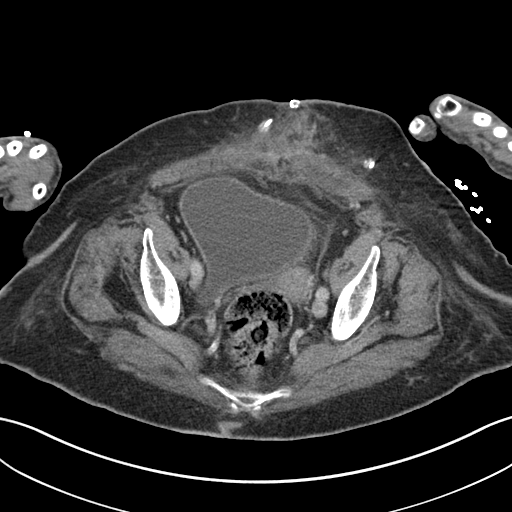
[im 26/83  soft-tissue]
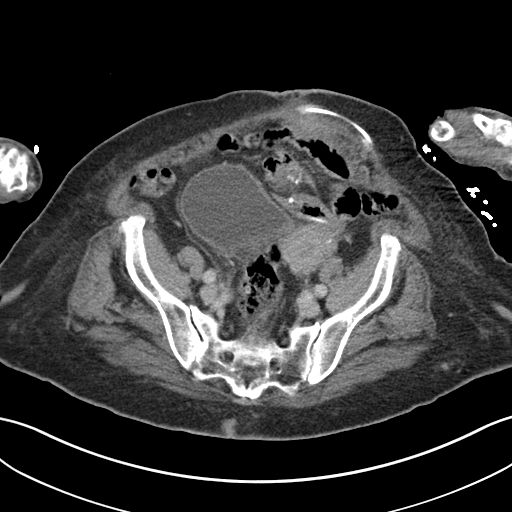
[im 31/83  soft-tissue]
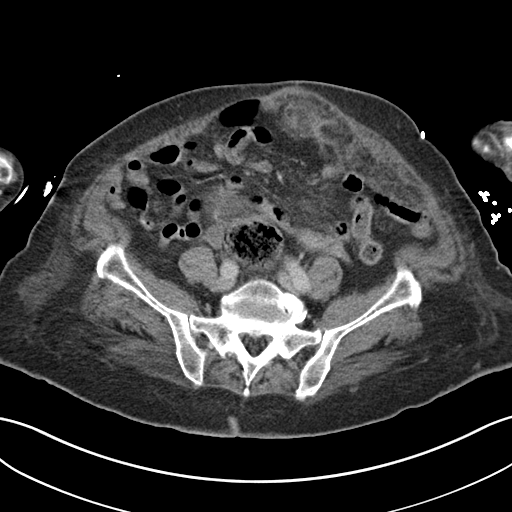
[im 36/83  soft-tissue]
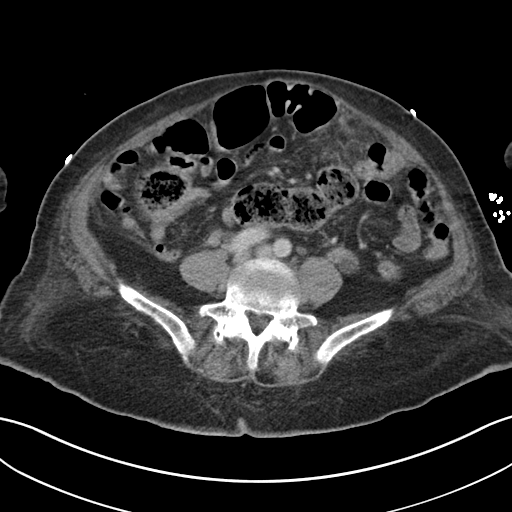
[im 47/83  soft-tissue]
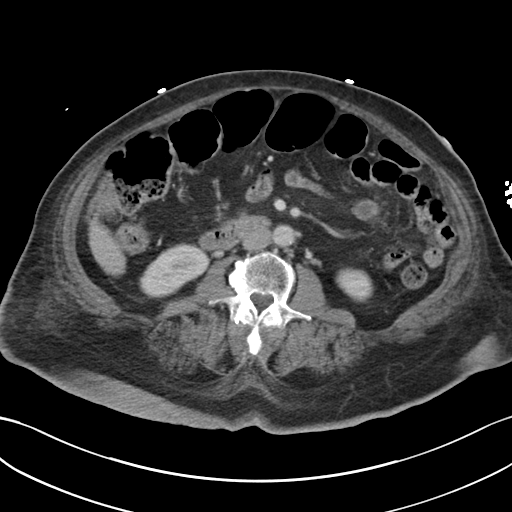
[im 52/83  soft-tissue]
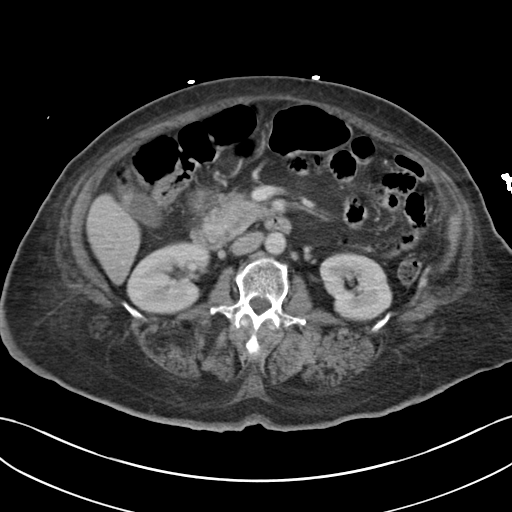
[im 57/83  soft-tissue]
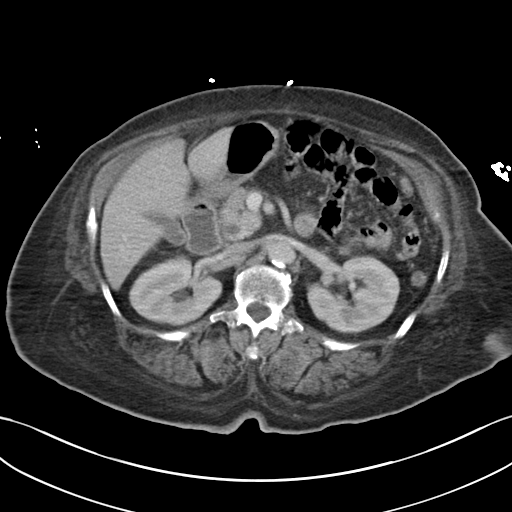
[im 57/83  bone]
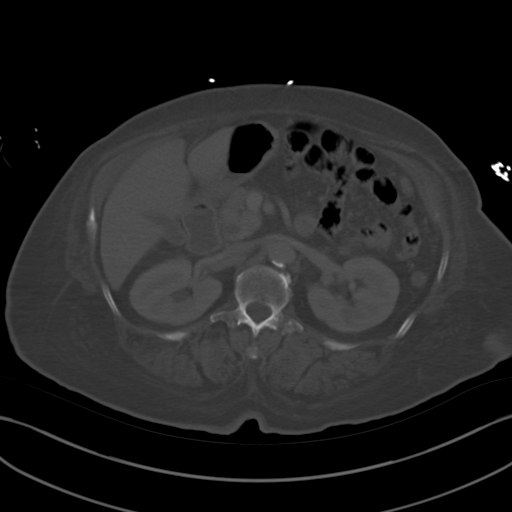
[im 62/83  soft-tissue]
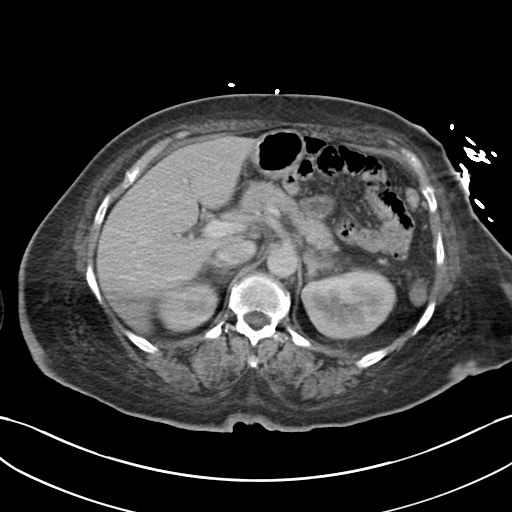
[im 72/83  soft-tissue]
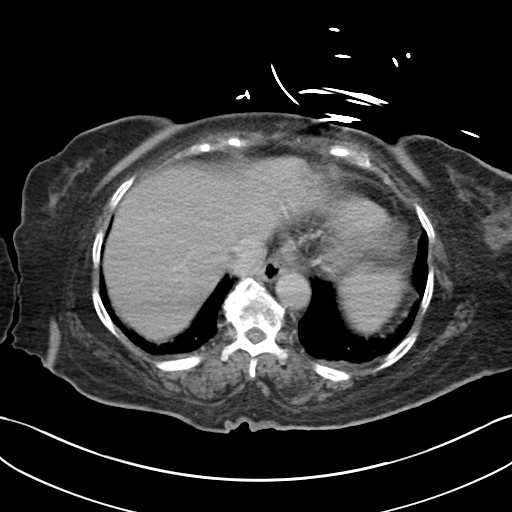
[im 77/83  soft-tissue]
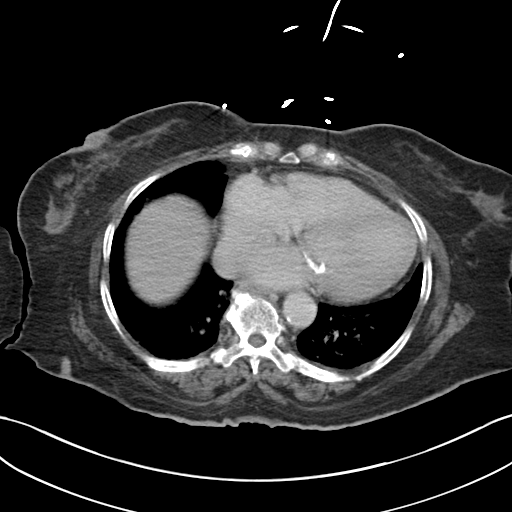

[Series 5: coronal st · coronal · 0.75mm/px · 3 of 100 slices shown]
[im 34/100  soft-tissue]
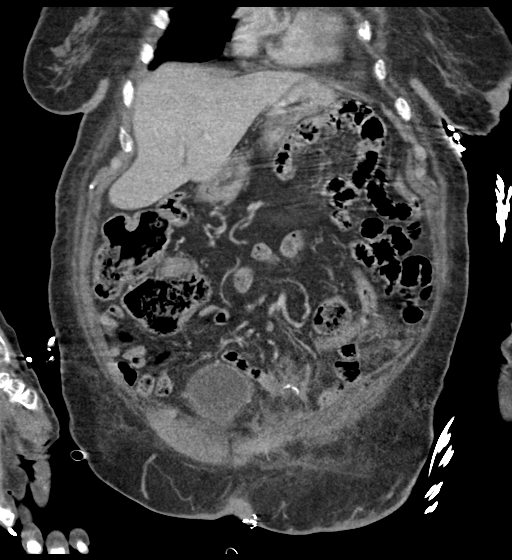
[im 45/100  soft-tissue]
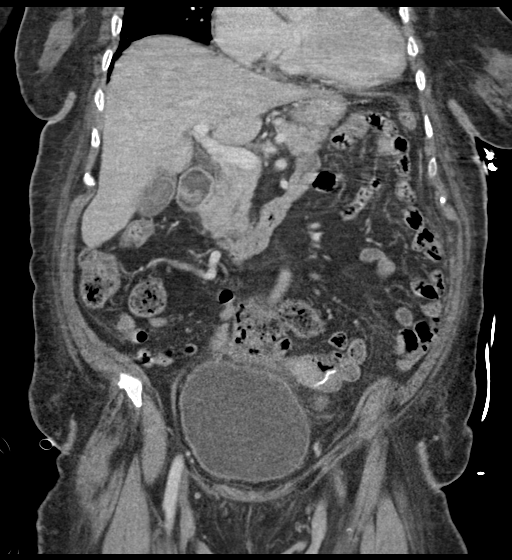
[im 56/100  soft-tissue]
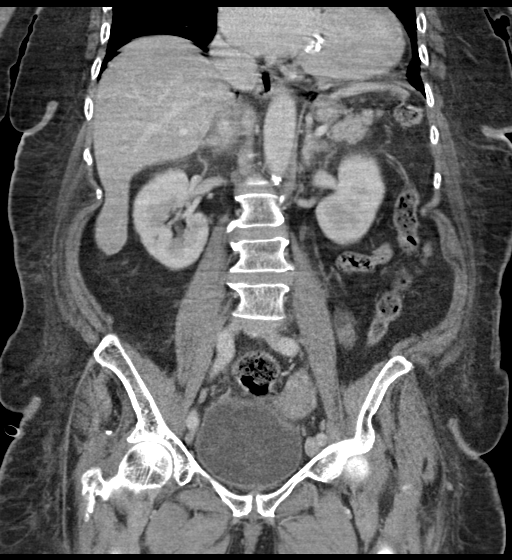

[15 of 46 positions shown; findings below may reference images not displayed]

FINDINGS: Lower chest: Lung base reticular opacities are noted bilaterally
consistent with subsegmental atelectasis. Lower lobe bronchial wall
thickening. No evidence of pneumonia or pulmonary edema. No pleural
effusion. Heart is enlarged.

Hepatobiliary: Normal liver. There is material of increased
echogenicity in the gallbladder fundus associated with a small
calcification. This may reflect an inherent mass. It could reflect
focal adenomyosis. Subtle increased attenuation is noted in the
dependent gallbladder which may reflect stones or sludge. There is
no wall thickening or adjacent inflammation. No bile duct dilation.

Pancreas: Unremarkable. No pancreatic ductal dilatation or
surrounding inflammatory changes.

Spleen: Normal in size without focal abnormality.

Adrenals/Urinary Tract: No adrenal masses.

6 mm low-density mass in the upper pole the right kidney. 7 mm
low-density mass in the posterior midpole of the left kidney. Both
of these are stable consistent with cysts. No other renal masses, no
stones and no hydronephrosis. Ureters are normal in course and in
caliber. Bladder is unremarkable.

Stomach/Bowel: Stomach is unremarkable. Small bowel is normal
caliber. No wall thickening. There is inflammatory type stranding in
the anterior peritoneal fat and inferior omentum in the left mid to
lower abdomen which abuts a loop of small bowel. No colonic wall
thickening or inflammation. Rectum is mild-to-moderately distended
with stool. There is mild increased stool burden in the right colon.
Normal appendix is visualized.

Vascular/Lymphatic: Aortic atherosclerosis. No enlarged abdominal or
pelvic lymph nodes.

Reproductive: 2 cm left anterior upper uterine segment subserosal
fibroid. No adnexal/ovarian masses.

Other: Surgical drain lies within the deep subcutaneous fat of the
left lower quadrant. There is inflammatory type stranding
surrounding the drain, but no defined collection to suggest an
abscess. Small residual defect is seen in the fascia of the left
lower quadrant through which a small amount of fat herniates. No
bowel enters this.

No ascites.

Musculoskeletal: No fracture or acute finding. No osteoblastic or
osteolytic lesions.
IMPRESSION: 1. There are postsurgical changes in the left lower abdomen from the
recent hernia repair. Inflammatory type stranding is seen in the fat
of the lower anterior abdominal wall and within the underlying
anterior mesentery and omentum. However, there is no defined
collection to suggest an abscess.
2. Small residual fascial defect is seen in the left lower quadrant,
just to the left of midline. Only a small amount of fat herniates
through this. No bowel.
3. Mild increased stool burden in the colon. No bowel obstruction or
inflammation.
4. Mild aortic atherosclerosis.

## 2020-07-10 DEATH — deceased
